# Patient Record
Sex: Female | Born: 1940 | Race: Black or African American | Hispanic: No | Smoking: Former smoker
Health system: Southern US, Community
[De-identification: ages and names within clinical notes are randomized; demographics above are authoritative.]

## PROBLEM LIST (undated history)

## (undated) DIAGNOSIS — C169 Malignant neoplasm of stomach, unspecified: Secondary | ICD-10-CM

## (undated) DIAGNOSIS — K5790 Diverticulosis of intestine, part unspecified, without perforation or abscess without bleeding: Secondary | ICD-10-CM

## (undated) DIAGNOSIS — I2699 Other pulmonary embolism without acute cor pulmonale: Secondary | ICD-10-CM

## (undated) DIAGNOSIS — R197 Diarrhea, unspecified: Secondary | ICD-10-CM

## (undated) DIAGNOSIS — E041 Nontoxic single thyroid nodule: Secondary | ICD-10-CM

## (undated) DIAGNOSIS — M1990 Unspecified osteoarthritis, unspecified site: Secondary | ICD-10-CM

## (undated) DIAGNOSIS — A048 Other specified bacterial intestinal infections: Secondary | ICD-10-CM

## (undated) DIAGNOSIS — E7800 Pure hypercholesterolemia, unspecified: Secondary | ICD-10-CM

## (undated) DIAGNOSIS — R109 Unspecified abdominal pain: Secondary | ICD-10-CM

## (undated) DIAGNOSIS — C799 Secondary malignant neoplasm of unspecified site: Secondary | ICD-10-CM

## (undated) DIAGNOSIS — M79606 Pain in leg, unspecified: Secondary | ICD-10-CM

## (undated) DIAGNOSIS — G8929 Other chronic pain: Secondary | ICD-10-CM

## (undated) DIAGNOSIS — D3500 Benign neoplasm of unspecified adrenal gland: Secondary | ICD-10-CM

## (undated) DIAGNOSIS — I471 Supraventricular tachycardia, unspecified: Secondary | ICD-10-CM

## (undated) DIAGNOSIS — I82409 Acute embolism and thrombosis of unspecified deep veins of unspecified lower extremity: Secondary | ICD-10-CM

## (undated) DIAGNOSIS — K219 Gastro-esophageal reflux disease without esophagitis: Secondary | ICD-10-CM

## (undated) DIAGNOSIS — K279 Peptic ulcer, site unspecified, unspecified as acute or chronic, without hemorrhage or perforation: Secondary | ICD-10-CM

## (undated) DIAGNOSIS — I10 Essential (primary) hypertension: Secondary | ICD-10-CM

## (undated) DIAGNOSIS — K449 Diaphragmatic hernia without obstruction or gangrene: Secondary | ICD-10-CM

## (undated) HISTORY — PX: JOINT REPLACEMENT: SHX530

## (undated) HISTORY — PX: CHOLECYSTECTOMY: SHX55

## (undated) HISTORY — PX: TOTAL KNEE ARTHROPLASTY: SHX125

## (undated) HISTORY — PX: ABDOMINAL HYSTERECTOMY: SHX81

## (undated) HISTORY — PX: APPENDECTOMY: SHX54

## (undated) HISTORY — PX: BREAST LUMPECTOMY: SHX2

## (undated) HISTORY — PX: OTHER SURGICAL HISTORY: SHX169

## (undated) HISTORY — PX: ABLATION: SHX5711

---

## 2000-09-18 ENCOUNTER — Encounter: Payer: Self-pay | Admitting: Neurosurgery

## 2000-09-18 ENCOUNTER — Ambulatory Visit (HOSPITAL_COMMUNITY): Admission: RE | Admit: 2000-09-18 | Discharge: 2000-09-18 | Payer: Self-pay | Admitting: Neurosurgery

## 2000-12-16 ENCOUNTER — Encounter: Payer: Self-pay | Admitting: Specialist

## 2000-12-19 ENCOUNTER — Inpatient Hospital Stay (HOSPITAL_COMMUNITY): Admission: RE | Admit: 2000-12-19 | Discharge: 2000-12-23 | Payer: Self-pay | Admitting: Specialist

## 2001-01-23 ENCOUNTER — Observation Stay (HOSPITAL_COMMUNITY): Admission: RE | Admit: 2001-01-23 | Discharge: 2001-01-24 | Payer: Self-pay | Admitting: General Surgery

## 2001-06-05 ENCOUNTER — Encounter: Payer: Self-pay | Admitting: Family Medicine

## 2001-06-05 ENCOUNTER — Ambulatory Visit (HOSPITAL_COMMUNITY): Admission: RE | Admit: 2001-06-05 | Discharge: 2001-06-05 | Payer: Self-pay | Admitting: Family Medicine

## 2002-02-26 ENCOUNTER — Ambulatory Visit (HOSPITAL_COMMUNITY): Admission: RE | Admit: 2002-02-26 | Discharge: 2002-02-26 | Payer: Self-pay | Admitting: Family Medicine

## 2002-02-26 ENCOUNTER — Encounter: Payer: Self-pay | Admitting: Family Medicine

## 2004-04-08 ENCOUNTER — Emergency Department (HOSPITAL_COMMUNITY): Admission: EM | Admit: 2004-04-08 | Discharge: 2004-04-08 | Payer: Self-pay | Admitting: Emergency Medicine

## 2004-04-26 ENCOUNTER — Ambulatory Visit (HOSPITAL_COMMUNITY): Admission: RE | Admit: 2004-04-26 | Discharge: 2004-04-26 | Payer: Self-pay | Admitting: General Surgery

## 2005-03-09 ENCOUNTER — Inpatient Hospital Stay (HOSPITAL_COMMUNITY): Admission: RE | Admit: 2005-03-09 | Discharge: 2005-03-13 | Payer: Self-pay | Admitting: Emergency Medicine

## 2005-03-09 ENCOUNTER — Emergency Department (HOSPITAL_COMMUNITY): Admission: EM | Admit: 2005-03-09 | Discharge: 2005-03-09 | Payer: Self-pay | Admitting: Emergency Medicine

## 2006-04-25 ENCOUNTER — Emergency Department (HOSPITAL_COMMUNITY): Admission: EM | Admit: 2006-04-25 | Discharge: 2006-04-25 | Payer: Self-pay | Admitting: Emergency Medicine

## 2006-04-26 ENCOUNTER — Emergency Department (HOSPITAL_COMMUNITY): Admission: EM | Admit: 2006-04-26 | Discharge: 2006-04-26 | Payer: Self-pay | Admitting: Emergency Medicine

## 2006-04-26 ENCOUNTER — Emergency Department (HOSPITAL_COMMUNITY): Admission: RE | Admit: 2006-04-26 | Discharge: 2006-04-26 | Payer: Self-pay | Admitting: Emergency Medicine

## 2006-04-29 ENCOUNTER — Inpatient Hospital Stay (HOSPITAL_COMMUNITY): Admission: EM | Admit: 2006-04-29 | Discharge: 2006-05-06 | Payer: Self-pay | Admitting: Emergency Medicine

## 2006-05-03 ENCOUNTER — Encounter: Payer: Self-pay | Admitting: Internal Medicine

## 2006-05-03 ENCOUNTER — Encounter: Payer: Self-pay | Admitting: Vascular Surgery

## 2007-02-09 ENCOUNTER — Emergency Department (HOSPITAL_COMMUNITY): Admission: EM | Admit: 2007-02-09 | Discharge: 2007-02-10 | Payer: Self-pay | Admitting: Emergency Medicine

## 2007-03-19 ENCOUNTER — Ambulatory Visit (HOSPITAL_COMMUNITY): Admission: RE | Admit: 2007-03-19 | Discharge: 2007-03-19 | Payer: Self-pay | Admitting: Family Medicine

## 2007-10-08 HISTORY — PX: CARDIAC CATHETERIZATION: SHX172

## 2007-11-22 ENCOUNTER — Inpatient Hospital Stay (HOSPITAL_COMMUNITY): Admission: EM | Admit: 2007-11-22 | Discharge: 2007-11-26 | Payer: Self-pay | Admitting: Emergency Medicine

## 2007-11-22 ENCOUNTER — Ambulatory Visit: Payer: Self-pay | Admitting: Cardiology

## 2007-11-22 ENCOUNTER — Encounter (INDEPENDENT_AMBULATORY_CARE_PROVIDER_SITE_OTHER): Payer: Self-pay | Admitting: Internal Medicine

## 2007-11-24 ENCOUNTER — Encounter: Payer: Self-pay | Admitting: Cardiovascular Disease

## 2007-11-24 ENCOUNTER — Ambulatory Visit: Payer: Self-pay | Admitting: Cardiology

## 2007-11-25 ENCOUNTER — Other Ambulatory Visit: Payer: Self-pay | Admitting: Cardiology

## 2007-11-25 ENCOUNTER — Other Ambulatory Visit: Payer: Self-pay | Admitting: Cardiovascular Disease

## 2007-12-21 ENCOUNTER — Ambulatory Visit: Payer: Self-pay | Admitting: Cardiology

## 2008-01-18 ENCOUNTER — Ambulatory Visit: Payer: Self-pay | Admitting: Cardiology

## 2008-07-11 ENCOUNTER — Emergency Department (HOSPITAL_COMMUNITY): Admission: EM | Admit: 2008-07-11 | Discharge: 2008-07-11 | Payer: Self-pay | Admitting: Emergency Medicine

## 2009-05-03 DIAGNOSIS — I10 Essential (primary) hypertension: Secondary | ICD-10-CM | POA: Insufficient documentation

## 2009-05-03 DIAGNOSIS — E041 Nontoxic single thyroid nodule: Secondary | ICD-10-CM

## 2009-05-03 DIAGNOSIS — I82409 Acute embolism and thrombosis of unspecified deep veins of unspecified lower extremity: Secondary | ICD-10-CM | POA: Insufficient documentation

## 2009-05-03 DIAGNOSIS — D3500 Benign neoplasm of unspecified adrenal gland: Secondary | ICD-10-CM

## 2009-05-03 DIAGNOSIS — E785 Hyperlipidemia, unspecified: Secondary | ICD-10-CM | POA: Insufficient documentation

## 2009-05-03 DIAGNOSIS — I471 Supraventricular tachycardia: Secondary | ICD-10-CM

## 2009-05-03 DIAGNOSIS — K219 Gastro-esophageal reflux disease without esophagitis: Secondary | ICD-10-CM | POA: Insufficient documentation

## 2009-05-11 ENCOUNTER — Ambulatory Visit: Payer: Self-pay | Admitting: Cardiology

## 2009-05-11 ENCOUNTER — Encounter (INDEPENDENT_AMBULATORY_CARE_PROVIDER_SITE_OTHER): Payer: Self-pay

## 2009-06-28 ENCOUNTER — Inpatient Hospital Stay (HOSPITAL_COMMUNITY): Admission: EM | Admit: 2009-06-28 | Discharge: 2009-07-02 | Payer: Self-pay | Admitting: Emergency Medicine

## 2009-06-29 ENCOUNTER — Ambulatory Visit: Payer: Self-pay | Admitting: Gastroenterology

## 2009-06-29 ENCOUNTER — Encounter: Payer: Self-pay | Admitting: Gastroenterology

## 2009-07-01 ENCOUNTER — Ambulatory Visit: Payer: Self-pay | Admitting: Internal Medicine

## 2009-07-26 ENCOUNTER — Encounter (INDEPENDENT_AMBULATORY_CARE_PROVIDER_SITE_OTHER): Payer: Self-pay | Admitting: *Deleted

## 2009-08-01 ENCOUNTER — Encounter: Payer: Self-pay | Admitting: Gastroenterology

## 2009-08-24 ENCOUNTER — Ambulatory Visit: Payer: Self-pay | Admitting: Internal Medicine

## 2009-08-24 DIAGNOSIS — Z8711 Personal history of peptic ulcer disease: Secondary | ICD-10-CM

## 2009-08-24 DIAGNOSIS — Z860100 Personal history of colon polyps, unspecified: Secondary | ICD-10-CM | POA: Insufficient documentation

## 2009-08-25 ENCOUNTER — Telehealth (INDEPENDENT_AMBULATORY_CARE_PROVIDER_SITE_OTHER): Payer: Self-pay

## 2009-08-25 ENCOUNTER — Encounter: Payer: Self-pay | Admitting: Gastroenterology

## 2009-09-13 ENCOUNTER — Ambulatory Visit: Payer: Self-pay | Admitting: Gastroenterology

## 2009-09-13 ENCOUNTER — Ambulatory Visit (HOSPITAL_COMMUNITY): Admission: RE | Admit: 2009-09-13 | Discharge: 2009-09-13 | Payer: Self-pay | Admitting: Gastroenterology

## 2009-09-13 HISTORY — PX: COLONOSCOPY: SHX174

## 2009-09-13 HISTORY — PX: ESOPHAGOGASTRODUODENOSCOPY: SHX1529

## 2009-09-25 ENCOUNTER — Encounter: Payer: Self-pay | Admitting: Gastroenterology

## 2010-02-20 ENCOUNTER — Encounter (INDEPENDENT_AMBULATORY_CARE_PROVIDER_SITE_OTHER): Payer: Self-pay | Admitting: *Deleted

## 2010-05-23 ENCOUNTER — Ambulatory Visit (HOSPITAL_COMMUNITY): Admission: RE | Admit: 2010-05-23 | Discharge: 2010-05-23 | Payer: Self-pay | Admitting: Family Medicine

## 2010-05-29 ENCOUNTER — Telehealth (INDEPENDENT_AMBULATORY_CARE_PROVIDER_SITE_OTHER): Payer: Self-pay

## 2010-07-30 ENCOUNTER — Ambulatory Visit: Payer: Self-pay | Admitting: Cardiology

## 2010-07-30 DIAGNOSIS — F17200 Nicotine dependence, unspecified, uncomplicated: Secondary | ICD-10-CM

## 2010-07-30 DIAGNOSIS — E663 Overweight: Secondary | ICD-10-CM | POA: Insufficient documentation

## 2010-08-27 ENCOUNTER — Encounter: Payer: Self-pay | Admitting: Gastroenterology

## 2010-09-17 ENCOUNTER — Encounter: Payer: Self-pay | Admitting: Cardiology

## 2010-09-18 ENCOUNTER — Encounter (INDEPENDENT_AMBULATORY_CARE_PROVIDER_SITE_OTHER): Payer: Self-pay | Admitting: *Deleted

## 2010-09-18 LAB — CONVERTED CEMR LAB
ALT: 22 units/L (ref 0–35)
AST: 18 units/L (ref 0–37)
Alkaline Phosphatase: 62 units/L (ref 39–117)
Bilirubin, Direct: 0.1 mg/dL (ref 0.0–0.3)
Cholesterol: 193 mg/dL (ref 0–200)
HDL: 51 mg/dL (ref >39)
Indirect Bilirubin: 0.4 mg/dL (ref 0.0–0.9)
LDL Cholesterol: 116 mg/dL — ABNORMAL HIGH (ref 0–99)
VLDL: 26 mg/dL (ref 0–40)

## 2010-11-06 NOTE — Assessment & Plan Note (Signed)
Summary: f1y  Medications Added DILTIAZEM HCL ER BEADS 360 MG XR24H-CAP (DILTIAZEM HCL ER BEADS) Take one capsule by mouth daily LOVASTATIN 40 MG TABS (LOVASTATIN) take 1 tablet by mouth once daily ZITHROMAX Z-PAK 250 MG TABS (AZITHROMYCIN) take 2 tablets on 1st day then 1 tablet once daily X 4 days      Allergies Added: NKDA  Visit Type:  Follow-up Primary Provider:  Mirna Mires, MD  CC:  chest congestin.  History of Present Illness: [name] comes in today for evaluation and management of her hypertension, obesity, tobacco use, and mixed hyperlipidemia.  She has lost 27 pounds. She feels remarkably better. Her blood pressures excellent today 120/76.  Unfortunately, she is smoking again. She developed an abscess tooth and since then she's had a chronic cough. She is producing a greenish thick sputum. She denies any fever, chills, night sweats. She denies hemoptysis.  Meds reviewed and she is compliant. Her last lipids showed her LDL not below 100 on 20 of lovastatin. Reviewed numbers with her.  She denies any chest pain or angina. She's had no lower extremity edema  Current Medications (verified): 1)  Diltiazem Hcl Er Beads 360 Mg Xr24h-Cap (Diltiazem Hcl Er Beads) .... Take One Capsule By Mouth Daily 2)  Metoprolol Tartrate 50 Mg Tabs (Metoprolol Tartrate) .... One By Mouth Daily 3)  Protonix 40 Mg Tbec (Pantoprazole Sodium) .... One By Mouth Bid 4)  Lovastatin 40 Mg Tabs (Lovastatin) .... Take 1 Tablet By Mouth Once Daily 5)  Aspirin 81 Mg Tbec (Aspirin) .... One By Mouth Daily 6)  Zithromax Z-Pak 250 Mg Tabs (Azithromycin) .... 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 Elpidio Anis  Past Surgical History: Last updated: 15-Sep-2009 right and left TKA, remote Left total hip replacement, remote hysterectomy cholecystectomy Appendectomy excusional breast history  Family History: Last updated: 09-15-09 Father:deceased due to emphysema Mother:deceased due to leukemia No family history of colon cancer, liver disease, peptic ulcer disease  Social History: Last updated: 2009/09/15 Retired [employer] Married, one son Tobacco Use - one pack per day Alcohol Use - no Regular Exercise - no Drug Use - no  Risk Factors: Exercise: no (05/03/2009)  Risk Factors: Smoking Status: never (05/03/2009)  Review of Systems       negative other than history of present illness  Vital Signs:  Patient profile:   70 year old female Weight:      226 pounds BMI:     36.61 Pulse rate:   71 / minute BP sitting:   120 / 76  (right arm)  Vitals Entered By: Dreama Saa, CNA (July 30, 2010 11:12 AM)  Physical Exam  General:  obese.  acuteobese.   Head:  normocephalic and atraumatic Eyes:  PERRLA/EOM intact; conjunctiva and lids normal. Mouth:  no exudate, mild erythema Neck:  Neck supple, no JVD. No masses, thyromegaly or abnormal cervical nodes. Lungs:  no decreased breath sounds or dullness to  percussion. No rhonchi or wheezes Heart:  normal PMI, soft S1-S2, no gallop. Negative carotid bruits Msk:  Back normal, normal gait. Muscle strength and tone normal. Pulses:  pulses normal in all 4 extremities Extremities:  No clubbing or cyanosis. Neurologic:  Alert and oriented x 3. Skin:   Intact without lesions or rashes. Psych:  Normal affect.   Problems:  Medical Problems Added: 1)  Dx of Overweight/obesity  (ICD-278.02) 2)  Dx of Overweight/obesity  (ICD-278.02) 3)  Dx of Tobacco User  (ICD-305.1)  Impression & Recommendations:  Problem # 1:  DYSLIPIDEMIA (ICD-272.4)  Will increase lovastatin to 40 mg per day. All blood work in 6 weeks. Goal LDL less than 100 Her updated medication list for this problem includes:    Lovastatin 40 Mg Tabs (Lovastatin) .Marland Kitchen... Take 1 tablet by mouth once daily  Future Orders: T-Lipid Profile ([id number]) ... 09/17/2010 T-Hepatic Function [id number]) ... 09/17/2010  Her updated medication list for this problem includes:    Lovastatin 40 Mg Tabs (Lovastatin) .Marland Kitchen... Take 1 tablet by mouth once daily  Problem # 2:  HYPERTENSION (ICD-401.9) Assessment: Improved  Her updated medication list for this problem includes:    Diltiazem Hcl Er Beads 360 Mg Xr24h-cap (Diltiazem hcl er beads) .Marland Kitchen... Take one capsule by mouth daily    Metoprolol Tartrate 50 Mg Tabs (Metoprolol tartrate) ..... One by mouth daily    Aspirin 81 Mg Tbec (Aspirin) ..... One by mouth daily  Her updated medication list for this problem includes:    Diltiazem Hcl Er Beads 360 Mg Xr24h-cap (Diltiazem hcl er beads) .Marland Kitchen... Take one capsule by mouth daily    Metoprolol Tartrate 50 Mg Tabs (Metoprolol tartrate) ..... One by mouth daily    Aspirin 81 Mg Tbec (Aspirin) ..... One by mouth daily  Problem # 3:  PSVT (ICD-427.0) Assessment: Unchanged  Her updated medication list for this problem includes:    Diltiazem Hcl Er Beads 360 Mg Xr24h-cap (Diltiazem hcl er beads) .Marland Kitchen... Take one capsule by mouth daily    Metoprolol Tartrate 50 Mg Tabs (Metoprolol tartrate) ..... One by mouth daily    Aspirin 81 Mg Tbec (Aspirin) ..... One by mouth daily  Her updated medication list for this problem includes:    Diltiazem Hcl Er Beads 360 Mg Xr24h-cap (Diltiazem hcl er beads) .Marland Kitchen...  Take one capsule by mouth daily    Metoprolol Tartrate 50 Mg Tabs (Metoprolol tartrate) ..... One by mouth daily    Aspirin 81 Mg Tbec (Aspirin) ..... One by mouth daily  Problem # 4:  TOBACCO USER (ICD-305.1) Assessment: Deteriorated she has acute on chronic bronchitis. Gave her antibiotic and advised to quit.  Problem # 5:  OVERWEIGHT/OBESITY (ICD-278.02) Assessment: Improved  Patient Instructions: 1)  Your physician recommends that you schedule a follow-up appointment in: 12 months 2)  Your physician recommends that you return for lab work in:6 weeks, wewill mail lab orders to you 3)  Your physician has recommended you make the following change in your medication: Start taking Z-pac as directed and increase Lovastatin to 40mg  once daily   4)  Your physician discussed the hazards of tobacco use.  Tobacco use cessation is recommended and techniques and options to help you quit were discussed. Prescriptions: ZITHROMAX Z-PAK 250 MG TABS (AZITHROMYCIN) take 2 tablets on 1st day then 1 tablet once daily X 4 days  #6 x 0   Entered by:   Larita Fife Via LPN   Authorized by:   Gaylord Shih, MD, [facility]  Signed by:   Larita Fife Via LPN on 91/47/8295   Method used:   Electronically to        [facility]* (retail)       [address]       [place], [place]  [address]       Ph: [phone]       Fax: [phone]   RxID:   [id number]   Handout requested. LOVASTATIN 40 MG TABS (LOVASTATIN) take 1 tablet by mouth once daily  #30 x 6   Entered by:   Larita Fife Via LPN   Authorized by:   Gaylord Shih, MD, [facility]   Signed by:   Larita Fife Via LPN on 42/59/5638   Method used:   Electronically to        [facility]* (retail)       [address]       [place], [place]  [address]       Ph: [phone]       Fax: [phone]   RxID:   [id number] DILTIAZEM HCL ER BEADS 360 MG XR24H-CAP (DILTIAZEM HCL ER BEADS) Take one capsule by mouth daily  #30 x 6   Entered by:   Larita Fife Via LPN    Authorized by:   Gaylord Shih, MD, [facility]   Signed by:   Larita Fife Via LPN on 03/30/7627   Method used:   Electronically to        [facility]* (retail)       [address]       [place], [place]  [address]       Ph: [phone]       Fax: [phone]   RxID:   [id number]

## 2010-11-06 NOTE — Letter (Signed)
Summary: [employer] PATIENT CHART REQUEST  [employer] PATIENT CHART REQUEST   Imported By: Rexene Alberts 08/27/2010 15:52:37  _____________________________________________________________________  External Attachment:    Type:   Image     Comment:   External Document

## 2010-11-06 NOTE — Letter (Signed)
Summary:  [facility], [facility] at [facility]  [address] [place] [address]   Phone: [phone]  Fax: [phone]     July 30, 2010 MRN: [id number]   [name] [address] [place], [place]  [address]      YOUR LAB WORK IS DUE  September 17, 2010 _________________________________________  Please go to [facility], located across the street from [facility] on the second floor.  Hours are Monday - Friday 7am until 7:30pm         Saturday 8am until 12noon    _X_  DO NOT EAT OR DRINK AFTER MIDNIGHT EVENING PRIOR TO LABWORK  __ YOUR LABWORK IS NOT FASTING --YOU MAY EAT PRIOR TO LABWORK

## 2010-11-06 NOTE — Progress Notes (Signed)
Summary: rx refill   Phone Note Call from Patient Call back at Home Phone [phone]   Reason for Call: Refill Medication Summary of Call: diltiazem 360mg  [facility] pt has made appt for 07/2010 with dr wall Initial call taken by: Faythe Ghee,  May 29, 2010 4:48 PM    Prescriptions: DILTIAZEM HCL ER BEADS 360 MG XR24H-CAP (DILTIAZEM HCL ER BEADS) Take one capsule by mouth daily  #30 x 1   Entered by:   Larita Fife Via LPN   Authorized by:   Gaylord Shih, MD, [facility]   Signed by:   Larita Fife Via LPN on 13/05/6577   Method used:   Electronically to        [facility]* (retail)       [address]       [place], [place]  [address]       Ph: [phone]       Fax: [phone]   RxID:   [id number]

## 2010-11-06 NOTE — Letter (Signed)
Summary: Appointment - Reminder 2  [facility] [place]. [address], [place] [address]   Phone: [phone]  Fax: [phone]     Feb 20, 2010 MRN: [id number]   [name] [address] [place], [place]  [address]   Dear Ms. Diviney,  Our records indicate that it is time to schedule a follow-up appointment.  Dr.   Daleen Squibb       recommended that you follow up with [place] in     11/2009       . It is very important that we reach you to schedule this appointment. We look forward to participating in your health care needs. Please contact us at the number listed above at your earliest convenience to schedule your appointment.  If you are unable to make an appointment at this time, give [place] a call so we can update our records.     Sincerely,   Glass blower/designer

## 2010-11-08 NOTE — Letter (Signed)
Summary: [place] [facility]  [address], [place] [address]   Phone: [phone]  Fax: [phone]     September 18, 2010 MRN: [id number]   [name] [address] [place], [place]  [address]      YOUR LAB WORK IS DUE   October 30, 2010  Please go to [facility], located across the street from [facility] on the second floor.  Hours are Monday - Friday 7am until 7:30pm         Saturday 8am until 12noon    _X_  DO NOT EAT OR DRINK AFTER MIDNIGHT EVENING PRIOR TO LABWORK

## 2011-01-11 LAB — COMPREHENSIVE METABOLIC PANEL
ALT: 30 U/L (ref 0–35)
Alkaline Phosphatase: 53 U/L (ref 39–117)
Alkaline Phosphatase: 60 U/L (ref 39–117)
BUN: 5 mg/dL — ABNORMAL LOW (ref 6–23)
BUN: 7 mg/dL (ref 6–23)
CO2: 29 mEq/L (ref 19–32)
Calcium: 8.7 mg/dL (ref 8.4–10.5)
Chloride: 105 mEq/L (ref 96–112)
GFR calc Af Amer: >60 mL/min (ref >60)
GFR calc non Af Amer: >60 mL/min (ref >60)
Glucose, Bld: 116 mg/dL — ABNORMAL HIGH (ref 70–99)
Glucose, Bld: 116 mg/dL — ABNORMAL HIGH (ref 70–99)
Potassium: 3.9 mEq/L (ref 3.5–5.1)
Sodium: 139 mEq/L (ref 135–145)
Total Bilirubin: 0.5 mg/dL (ref 0.3–1.2)
Total Protein: 7 g/dL (ref 6.0–8.3)

## 2011-01-11 LAB — PROTIME-INR
INR: 1 (ref 0.00–1.49)
INR: 1.1 (ref 0.00–1.49)
Prothrombin Time: 13.8 seconds (ref 11.6–15.2)

## 2011-01-11 LAB — URINE CULTURE

## 2011-01-11 LAB — CBC
MCV: 80.9 fL (ref 78.0–100.0)
Platelets: 254 10*3/uL (ref 150–400)
Platelets: 287 10*3/uL (ref 150–400)
RBC: 5.08 MIL/uL (ref 3.87–5.11)
RDW: 16.4 % — ABNORMAL HIGH (ref 11.5–15.5)
RDW: 16.6 % — ABNORMAL HIGH (ref 11.5–15.5)

## 2011-01-11 LAB — TYPE AND SCREEN: ABO/RH(D): O POS

## 2011-01-11 LAB — BASIC METABOLIC PANEL
BUN: 5 mg/dL — ABNORMAL LOW (ref 6–23)
Calcium: 8.8 mg/dL (ref 8.4–10.5)
GFR calc non Af Amer: >60 mL/min (ref >60)
Glucose, Bld: 104 mg/dL — ABNORMAL HIGH (ref 70–99)

## 2011-01-11 LAB — HEMOGLOBIN AND HEMATOCRIT, BLOOD
HCT: 37.8 % (ref 36.0–46.0)
HCT: 38.6 % (ref 36.0–46.0)
HCT: 38.7 % (ref 36.0–46.0)
HCT: 38.8 % (ref 36.0–46.0)
Hemoglobin: 12.3 g/dL (ref 12.0–15.0)
Hemoglobin: 12.6 g/dL (ref 12.0–15.0)
Hemoglobin: 12.7 g/dL (ref 12.0–15.0)
Hemoglobin: 12.9 g/dL (ref 12.0–15.0)
Hemoglobin: 12.9 g/dL (ref 12.0–15.0)
Hemoglobin: 13 g/dL (ref 12.0–15.0)

## 2011-01-11 LAB — DIFFERENTIAL
Basophils Absolute: 0 10*3/uL (ref 0.0–0.1)
Eosinophils Relative: 2 % (ref 0–5)
Lymphocytes Relative: 23 % (ref 12–46)
Lymphocytes Relative: 31 % (ref 12–46)
Monocytes Absolute: 0.4 10*3/uL (ref 0.1–1.0)
Monocytes Relative: 7 % (ref 3–12)
Neutro Abs: 3.2 10*3/uL (ref 1.7–7.7)
Neutrophils Relative %: 57 % (ref 43–77)

## 2011-01-11 LAB — LIPASE, BLOOD: Lipase: 39 U/L (ref 11–59)

## 2011-01-11 LAB — URINALYSIS, ROUTINE W REFLEX MICROSCOPIC
Hgb urine dipstick: NEGATIVE
Nitrite: NEGATIVE
Protein, ur: NEGATIVE mg/dL
pH: 6 (ref 5.0–8.0)

## 2011-01-11 LAB — H. PYLORI ANTIBODY, IGG: H Pylori IgG: 1.3 {ISR} — ABNORMAL HIGH

## 2011-02-19 NOTE — H&P (Signed)
[name], [name]                ACCOUNT NO.:  [id number]   MEDICAL RECORD NO.:  [id number]          PATIENT TYPE:  INP   LOCATION:  A219                          FACILITY:  [facility]   PHYSICIAN:  Gerrit Friends. Dietrich Pates, MD, FACCDATE OF BIRTH:  09-19-1941   DATE OF ADMISSION:  11/22/2007  DATE OF DISCHARGE:  LH                              HISTORY & PHYSICAL   REFERRING PHYSICIAN:  Osvaldo Shipper, M.D.   REASON FOR REFERRAL:  1. Supraventricular tachycardia.  2. Elevated cardiac enzymes.   HISTORY OF PRESENT ILLNESS:  [name] is a 70 year old female patient  without any known history of CAD who presented to [facility]  early yesterday morning with acute onset of substernal chest tightness,  tachy palpitations, and right arm pain.  She was found to be in  supraventricular tachycardia with a heart rate in the 180s.  She was  treated with Adenocard x1 and she returned to normal sinus rhythm.  She  was admitted for further evaluation and treatment.   Unfortunately [name]'s sister recently died.  She has been under a  great deal of stress and traveling back and forth to the funeral home.  She has been smoking more cigarettes lately and also drinking excessive  amounts of green tea.  She is unsure whether or not she has had any  exertional chest discomfort or shortness of breath recently.  She has,  however, developed a recent cough over the last couple of days.  This is  fairly nonproductive.  She denies any fevers or chills.  She denies  hemoptysis.  Early yesterday morning she developed the sudden onset of  substernal chest tightness and tachy palpitations.  She has had tachy  palpitations like this in the past, but not this severe.  She had  radiation down her right arm as well as diaphoresis and shortness of  breath.  She denies any syncope or near syncope.  As noted above, she  converted to normal sinus rhythm with  adenosine.  She tells me that she  did have an episode  of tachy palpitations 10-to-15 years ago and was  apparently evaluated at [facility].  At that point in time she  was told that she had an angina attack.   PAST MEDICAL HISTORY:  1. Hypertension.  2. Hyperlipidemia.  3. History of thyroid nodule.  4. History of left adrenal adenoma.  5. Gastroesophageal reflux disease/peptic ulcer disease.  6. History of right lower extremity DVT in 2007.      a.     She was also noted to have hematoma in her right calf which       required hospitalization-repeat Dopplers revealed DVT to be below       the knee.  7. Status post bilateral total knee replacements.  8. Status post left hip replacement.  9. Status post total abdominal hysterectomy and bilateral salpingo-      oophorectomy.  10.Status post cholecystectomy.  11.Status post left breast lumpectomy.   ALLERGIES:  NO KNOWN DRUG ALLERGIES.   MEDICATIONS PRIOR TO ADMISSION:  1. Toprol-XL  50 mg daily.  2. Lovastatin 40 mg daily.  3. Aspirin 81 mg daily.   SOCIAL HISTORY:  The patient is married, has 1 child.  She smokes  cigarettes at 1-to-2 packs per day for the last 20 years.  She denies  alcohol or drug abuse.  She is retired from [employer].   FAMILY HISTORY:  Her sister recently passed away in her 100s from  congestive heart failure.   REVIEW OF SYSTEMS:  Please see HPI.  She denies any melena,  hematochezia, hematuria, dysuria.  She does have some dysphagia as well  as odynophagia.  This has been chronic for some years now without much  change.  She denies any monocular blindness, unilateral weakness,  difficulty with speech, or facial droop.  She denies any claudication  symptoms.  She has gained some weight, but nothing significant.  She  denies any skin or hair changes.  The rest of the review of systems are  negative.   PHYSICAL EXAMINATION:  GENERAL:  She is a well-developed, well-nourished  female in no acute distress.  VITAL SIGNS:  Blood pressure is  131/78, pulse of 94, respirations 18,  temperature 97.5, oxygen is 93% on room air.  HEENT:  Normal.  NECK:  Without JVD.  Without thyromegaly.  LYMPH:  Without lymphadenopathy.  CARDIAC:  Normal S1 and S2.  Regular rate and rhythm.  There is a 1/6  systolic ejection murmur heard best at the left lower sternal border.  LUNGS:  Decreased breath sounds bilaterally, expiratory wheezing  throughout.  No rales.  ABDOMEN:  Soft, nontender with normoactive bowel sounds, no  organomegaly.  EXTREMITIES:  Without edema.  SKIN:  Warm and dry.  NEUROLOGIC:  She is alert and oriented x3.  Cranial nerves II through  XII are grossly intact.  VASCULAR EXAM:  Without carotid bruits bilaterally.  Femoral artery  pulses are 2+ bilaterally without bruits.  Dorsalis pedis and posterior  tibialis pulses are 2+ bilaterally.   Electrocardiogram on admission revealed SVT with a heart rate of 184, 1-  to-2 mm of ST depression in II, III, aVF, and V5 and V6.  Followup EKGs  revealed normal sinus rhythm.  Chest x-ray on admission revealed no  acute disease.   LABORATORY STUDIES:  White count 4700, hemoglobin 13.3, hematocrit 38.8,  MCV 81.9, platelet count 255,000.  INR 1.  Sodium 137, potassium 3.6,  glucose 183, BUN 8, creatinine 0.8.  CK#1 339, #2 365, #3 447.  CK-MB #1  3.5, #2 13.5, #3 10.9.  Troponin #1 0.06, #2 1.41, #3 1.32.  BNP 40.9.   IMPRESSION:  1. Supraventricular tachycardia.  2. Non-ST elevation myocardial infarction in the setting of #1.  3. Probable chronic obstructive pulmonary disease exacerbation.  4. Hypertension.  5. Hyperlipidemia.  6. Smoker.  7. Gastroesophageal reflux disease.  8. History of deep venous thrombosis.  9. Osteoarthritis.   PLAN:  [name] presents with supraventricular tachycardia and positive  cardiac enzymes.  She does have significant risk factors for coronary  artery disease.  She will require further evaluation with cardiac  catheterization.  Risks and  benefits have been explained to the patient.  She agrees to proceed.  She will be transferred to [facility]  for cardiac catheterization.  She will be continued on Lovenox and  aspirin.  Her statin will be continued.   She will also require EP evaluation for her supraventricular  tachycardia.  Her diltiazem will be discontinued.  She will need continued treatment for her COPD exacerbation.  She has  been placed on IV Solu-Medrol as well as Levaquin at [facility].  We will continue that as well as her nebulizer treatments.  We will also place her on proton pump inhibitor therapy.   The patient was also interviewed and examined by Dr. Marlton Bing  today.      Tereso Newcomer, PA-C      Gerrit Friends. Dietrich Pates, MD, [facility]  Electronically Signed    SW/MEDQ  D:  11/23/2007  T:  11/24/2007  Job:  [id number]   cc:   Annia Friendly. Loleta Chance, MD  Fax: [phone]

## 2011-02-19 NOTE — Op Note (Signed)
NAMEKATHRIN, FOLDEN NO.:  [id number]   MEDICAL RECORD NO.:  [id number]          PATIENT TYPE:  INP   LOCATION:  2034                         FACILITY:  [facility]   PHYSICIAN:  Doylene Canning. 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   INTRODUCTION:  The patient is a 70 year old woman with a history of  recurrent tachy palpitations dating back approximately 5 years.  She has  been on beta blocker therapies.  She presented to hospital with SVT and  was subsequently found to rule in for a non-Q-wave myocardial  infarction.  The patient underwent catheterization demonstrating no  coronary disease.  She is now referred for electrophysiologic study and  catheter ablation of her SVT.   PROCEDURE:  After informed consent was obtained, the patient was taken  to the diagnostic EP lab in a fasting state.  After the usual  preparation and draping, intravenous fentanyl and Midazolam were given  for sedation.  A 6-French hexapolar catheter was inserted percutaneously  into the right jugular vein and advanced to coronary sinus.  A 5-French  quadripolar catheter was inserted percutaneously in the right femoral  vein and advanced to the RV apex.  A 5-French quadripolar catheter was  inserted percutaneously in the right femoral vein and advanced to His  bundle region.  After measurement of the basic intervals, rapid  ventricular pacing was carried out from the RV apex demonstrating VA  Wenckebach cycle length of 260 milliseconds.  During rapid ventricular  pacing the atrial activation sequence appeared be midline.  It was  minimally decremental.  Programmed ventricular stimulation was then  carried out  from the RV apex at base drive cycle length of 454  milliseconds.  The S1-S2 interval was stepwise decreased down to 240  milliseconds where ventricular refractoriness was observed.  During  programmed ventricular stimulation the atrial activation sequence again  appeared to be midline though it was minimally decremental.  During  programmed ventricular stimulation there was nonsustained SVT.  Rapid  atrial pacing was then carried out from the coronary sinus as well as  the high right atrium at pace cycle length of 500 milliseconds and  stepwise decreased down to 270 milliseconds where AV Wenckebach was  observed.  During rapid atrial pacing the PR interval was less than the  RR interval and there was no inducible SVT.  Programmed atrial  stimulation was carried out from the coronary sinus and high right  atrium at base drive cycle length of 098 milliseconds.  The S1-S2  interval was stepwise decreased to 240 milliseconds where atrial  refractoriness was observed.  During programmed atrial stimulation there  were no AH jumps and no echo beats noted.  At this point Isuprel was  infused at rates between 2 and 4 mcg per minute.  Rapid atrial pacing on  Isuprel resulted in the initiation of SVT.  This was very rapid  tachycardia with a cycle length of 240 milliseconds.  The patient was  hemodynamically stable though she did complain of some chest discomfort  and chest heaviness.  PVCs could not be placed secondary to the extreme  speed of the tachycardia and would not capture the ventricle.  Ventricular pacing was carried out at one point, demonstrating a VAV  conduction sequence.  At this point with the midline atrial activation,  a diagnosis of a concealed posteroseptal accessory pathway was made,  based on the lack of the AH prolongation during probing stimulation and  the longer than usual VA conduction time during tachycardia.  The VA  time was relatively short, however compared to what  would be typically  expected with an accessory pathway.  Mapping demonstrated earliest  atrial activation in the His A as well as near the CS os.  A 7-French  quadripolar ablation catheter was then maneuvered into the posteroseptal  space.  Extensive mapping and ablation was carried out with a total of  11 RF energy applications delivered in and around the coronary sinus  ostium.  Following ablation, there was still nonsustained SVT present.  The VA times, however, had changed and they were much longer and the  atrial activation appeared to still be posteroseptal but it was unclear  whether it was actually through the AV node or not.  With all the above  and concerns that additional RF energy application deep into the  coronary sinus might resulted in perforation, because of additional  ablation near the AV node my concern was for the creation complete heart  block and because of the overall difficulty with the procedure after  ablation in terms of getting the patient's SVT to return and be  sustained, making mapping more difficult, it was deemed most appropriate  to discontinue the procedure and the patient was returned to her room in  satisfactory condition after the sheath was pulled.   COMPLICATIONS:  There were no immediate complications.   RESULTS:  a.  Baseline ECG.  The baseline ECG demonstrates sinus rhythm  with normal axis and intervals.  b.  Baseline intervals.  Sinus node cycle length was 720 milliseconds.  The HV interval was 46 milliseconds.  The AH interval 59 milliseconds.  c.  Rapid ventricular pacing.  Rapid ventricular pacing was carried out  the RV apex demonstrating VA Wenckebach cycle length 260 milliseconds.  The atrial activation appeared to be midline during rapid ventricular  pacing.  It was minimally decremental.  d.  Programmed ventricular stimulation.  Programmed ventricular  stimulation was carried out from the RV apex at base drive cycle length  of 161  milliseconds.  The S1-S2 interval was stepwise decreased to 240  milliseconds where retrograde AV node ERP was observed.  During  programmed ventricular stimulation, the atrial activation was midline  but minimally decremental.  e.  Rapid atrial pacing.  Rapid atrial pacing initially demonstrated AV  Wenckebach cycle length of 270 milliseconds.  On Isuprel additional  rapid atrial pacing was carried out down to 240 milliseconds with  persistence of AV conduction.  During rapid atrial pacing the PR  interval was less than the RR interval and on Isuprel there was  inducible SVT.  f.  Programmed atrial stimulation.  Programmed atrial stimulation was  carried out from the coronary sinus and high right atrium with base  drive cycle length 096 milliseconds with the S1-S2  interval stepwise  decreased down to 240 milliseconds where atrial refractoriness was  observed.  During programmed atrial stimulation there were no AH jumps  and no echo beats noted.  There is no inducible SVT.  g.  Arrhythmias observed.  1. AV reentry tachycardia.  Initiation was with rapid atrial pacing on      Isuprel.  Duration was initially sustained.  Cycle length was 240      milliseconds.  PVCs could not be placed secondary to the inability      to capture the ventricle secondary to the very rapid rates.  Rapid      ventricular pacing did demonstrate a VAV conduction sequence.      a.     Mapping.  Mapping of the patient's SVT was very difficult.       There appeared be change in the atrial activation sequences during       mapping, raising the question of multiple septal accessory       pathways.  Following ablation for which extensive ablation was       carried out along the posteroseptal space as well as into the       coronary sinus as well as approximately halfway up Koch's triangle       towards the AV node, following ablation, sustained tachycardia       could not be reinduced.  There was additional  nonsustained SVT       with different atrial activation sequences raising the question of       what was residually left of the patient's accessory pathway       conduction.  However, because of the extensive RF energy       application in very difficult and potentially dangerous sites,       additional RF energy application was not delivered.   CONCLUSION:  This study demonstrates inducible AV reentry tachycardia  utilizing a concealed posterior versus mid septal accessory pathway  perhaps both.  It demonstrates following ablation no sustained  tachycardia to be present with clinical significance uncertain.      Doylene Canning. Ladona Ridgel, MD  Electronically Signed     GWT/MEDQ  D:  11/25/2007  T:  11/26/2007  Job:  [id number]   cc:   Gerrit Friends. Dietrich Pates, MD, [facility]

## 2011-02-19 NOTE — Assessment & Plan Note (Signed)
[facility]                       [place] [facility] OFFICE NOTE   [name], [name]                       MRN:          [id number]  DATE:05/11/2009                            DOB:          1940/10/12    PROBLEM LIST:  1. Hypertension.  2. Obesity.  3. Hypercholesterolemia.  4. Ongoing tobacco abuse.  5. History of hiatal hernia.   CURRENT MEDICATIONS:  1. Cardizem 360 daily.  2. Mucinex 600 mg p.o. b.i.d.  3. Protonix 40 mg daily.  4. Lovastatin 20 mg daily.  5. Enteric-coated aspirin 81 mg daily.   ALLERGIES:  No known drug allergies.   This is a very pleasant 70 year old [place] American female who is here  on followup after being seen by Dr. Daleen Squibb in April 2009 for 1-year  followup.  The patient was given a lengthy discussion by Dr. Daleen Squibb  concerning blood pressure control losing weight and stopping smoking.  The patient's Cardizem was refilled at the time of that visit, and she  was advised to take her medicines as directed and increase her activity  level.   The patient states she has been feeling well, but she is upset about a  10-pound weight gain, so she returned to smoking to help with her weight  loss.  This was discouraged on this office visit.  She also requested  some medication to help her to lose weight and again this was refused  secondary to her history of hypertension.  The patient otherwise has  been feeling well.  Her blood pressure is very well controlled, and she  has no complaints with the exception of the weight gain.  All systems  have been reviewed and are found to be negative.   PHYSICAL EXAMINATION:  VITAL SIGNS:  Weight 260 pounds, blood pressure  128/78, pulse 89, respirations 20.  HEENT:  Head is normocephalic and atraumatic.  Eyes, PERRLA.  Mucous  membranes and mouth are pink and moist.  Tongue is midline.  NECK:  Supple without JVD or carotid bruits appreciated.  CARDIOVASCULAR:  Regular rate and  rhythm without murmurs, rubs, or  gallops.  Pulses are 2+ and equal bilaterally.  LUNGS:  Clear to auscultation without wheezes, rales, or rhonchi.  ABDOMEN:  Soft, nontender, obese with 2+ bowel sounds.   IMPRESSION:  1. Hypertension.  Well controlled on current medication regimen.  2. Obesity.  This has been discussed with the patient to increase her      activity level, to walking at least 15 minutes a day and to avoid      high-calorie foods.  The patient has also been advised on a low-      salt diet with ongoing hypertension, although it is well controlled      on current medication regimen.  The patient is requesting      medication diet drugs to assist in weight loss.  This has been      refused in the setting of hypertension.  3. Hypercholesterolemia.  The patient continues on her lovastatin and      will have a follow up with primary  care physician concerning lipids      and LFTs at his discretion.  4. Inactivity.  The patient has been advised on walking 15 minutes a      day and to increase this distance and time as she is able to      tolerate.      Bettey Mare. Lyman Bishop, NP  Electronically Signed      Jesse Sans. Daleen Squibb, MD, [facility]  Electronically Signed   KML/MedQ  DD: 05/11/2009  DT: 05/12/2009  Job #: [id number]   cc:   Annia Friendly. Loleta Chance, MD

## 2011-02-19 NOTE — Letter (Signed)
December 21, 2007    Annia Friendly. Loleta Chance, MD  [address]  [place] [address], [place] [address]   RE:  [name], [name]  MRN:  [id number]  /  DOB:  05-01-1941   Dear Earvin Hansen:   Ms. Cipriani returns to the office following a recent admission a [facility] with SVT and chest discomfort.  Cardiac markers were  borderline positive, but cardiac catheterization revealed no significant  coronary disease.  She underwent radiofrequency ablation with control of  her arrhythmia.  She has felt fine since hospital discharge.   Current medications include Protonix 40 mg b.i.d., Diltiazem 360 mg  daily, Mucinex, prednisone 10 mg daily, Xopenex by MDI.   EXAM:  Overweight pleasant woman in no acute distress.  The weight is 238.  Blood pressure 115/75, heart rate 95 and regular,  respirations 18.  NECK:  No jugular venous distention; small area of scarring at the site  of a pacing wire insertion into the right jugular.  LUNGS:  Clear.  CARDIAC:  Normal first and second heart sounds.  ABDOMEN:  Soft and nontender; no organomegaly.  EXTREMITIES:  Benign right femoral vein catheterization site.   EKG:  Normal sinus rhythm; left atrial abnormality; otherwise normal.   IMPRESSION:  Ms. Morsch is doing well following radiofrequency ablation  of a supraventricular tachycardia.  Her likelihood of durable cure is  greater than 90%.  She does not appear to require beta-blocker and  benazepril, which she previously took for hypertension.  I will send her  back to Dr. Loleta Chance to monitor blood pressure.  I would be happy to  reassess this nice woman in any time he deems appropriate.    Sincerely,      Gerrit Friends. Dietrich Pates, MD, [facility]  Electronically Signed    RMR/MedQ  DD: 12/21/2007  DT: 12/21/2007  Job #: [id number]

## 2011-02-19 NOTE — Procedures (Signed)
NAMELINDEN, MIKES                ACCOUNT NO.:  [id number]   MEDICAL RECORD NO.:  [id number]          PATIENT TYPE:  INP   LOCATION:  A219                          FACILITY:  [facility]   PHYSICIAN:  Gerrit Friends. Dietrich Pates, MD, FACCDATE OF BIRTH:  30-Jan-1941   DATE OF PROCEDURE:  11/23/2007  DATE OF DISCHARGE:  11/24/2007                                ECHOCARDIOGRAM   CLINICAL DATA:  The patient is a 70 year old woman with SVT,  hypertension, and possible non-Q myocardial infarction.   M-MODE:  Aorta 2.8, left atrium 4.5, septum 1.7, posterior wall 1.5, LV  diastole 3.4, LV systole 2.5.   1. Technically suboptimal and somewhat limited echocardiographic      study.  2. Mild left atrial enlargement; right atrial imaging suboptimal.  3. Normal right ventricular size and function; mild RVH.  4. Mild sclerosis of a trileaflet aortic valve.  5. Normal diameter of the proximal ascending aorta; mild calcification      of the wall and annulus.  6. Normal mitral valve; mild annular calcification.  7. Normal pulmonic valve and normal proximal pulmonary artery.  8. Normal left ventricular size; mild to moderate hypertrophy;      hyperdynamic regional and global function with cavity obliteration      during systole.      Gerrit Friends. Dietrich Pates, MD, [facility]  Electronically Signed     RMR/MEDQ  D:  11/23/2007  T:  11/24/2007  Job:  [id number]

## 2011-02-19 NOTE — Discharge Summary (Signed)
[name], [name]                ACCOUNT NO.:  [id number]   MEDICAL RECORD NO.:  [id number]          PATIENT TYPE:  INP   LOCATION:  A219                          FACILITY:  [facility]   PHYSICIAN:  Osvaldo Shipper, MD     DATE OF BIRTH:  Feb 14, 1941   DATE OF ADMISSION:  11/22/2007  DATE OF DISCHARGE:  LH                               DISCHARGE SUMMARY   DATE OF POTENTIAL DISCHARGE:  November 24, 2007.   The patient is being transferred to [facility] for a  cardiac catheterization.   DIAGNOSES AT THE TIME OF TRANSFER:  1. Non-ST elevation myocardial infarction.  2. Supraventricular tachycardia, resolved.  3. Acute bronchitis.  4. History of hypertension.  5. Dyslipidemia, stable.   BRIEF HOSPITAL COURSE:  Briefly, this is a 70 year old African American  female who came into the hospital with shortness of breath and wheezing  and was having chest pain and was found to have SVT at the rate of 180s.  She was given adenosine, with which she corrected to a sinus rhythm.  Her chest pain persisted.  The patient was admitted to the hospital for  further evaluation.  Her cardiac markers went up with the CK going up to  365 and the troponin to 1.41.  Troponin has come down today to 0.62.  CK  is 693 today.  Her other labs are all fine.  Her HbA1c is 6.2 with a  mean glucose of 143.  TSH was 0.103.  Cholesterol was checked, which  showed an LDL of 118.  The patient was seen by cardiology, who felt that  her coronary artery disease may have been unmasked by the SVT and they  felt that she needed a cardiac catheterization.  So the patient is being  transferred to [facility].  She was supposed to go  yesterday but there was no bed available, so she is still waiting at  this point.   Her acute bronchitis has improved in the meantime.  She continues to be  on steroids, nebulizer treatments and antibiotics.  She has doing much,  much better compared to  yesterday.   Hypertension:  We have added lisinopril as of yesterday and her blood  pressure is better-controlled.  She continues to be on statin.  We  avoided beta blockers because of significant wheezing.   Discharge medications, instructions, etc., will be decided when the  patient is discharged from [facility].   This morning the patient is feeling much.  Better said her breathing is  better.  She still had some nonspecific chest pain in the retrosternal  region which is described as a tightness, but that is also better.  Her vital signs show that this morning her temperature was 98.4, heart  rate 101, respiratory rate 18, saturations 95% on room air, blood  pressure 132/80.  LUNGS:  Significantly improved air entry.  Now very few wheezing  compared to yesterday and the day before.  ABDOMEN:  Soft.  EXTREMITIES:  No edema.   Labs all stable.  As mentioned above,  troponin has trended down.  Her  TSH is low.   ASSESSMENT/PLAN:  As per above, awaiting transfer to [facility] for  cardiac catheterization.  Echo report is still pending. Repeat TFT's  will be ordered. Her steroids will also need to be tapered.   TOTAL TIME AT DISCHARGE:  35 minutes.      Osvaldo Shipper, MD  Electronically Signed     GK/MEDQ  D:  11/24/2007  T:  11/24/2007  Job:  [id number]   cc:   Gerrit Friends. Dietrich Pates, MD, [facility]  [address]  [place], [place] [address]   Annia Friendly. Loleta Chance, MD  Fax: [phone]

## 2011-02-19 NOTE — Discharge Summary (Signed)
[name], Frost NO.:  [id number]   MEDICAL RECORD NO.:  [id number]          PATIENT TYPE:  INP   LOCATION:  2034                         FACILITY:  [facility]   PHYSICIAN:  Madolyn Frieze. Jens Som, MD, FACCDATE OF BIRTH:  07/05/1941   DATE OF ADMISSION:  11/24/2007  DATE OF DISCHARGE:  11/26/2007                               DISCHARGE SUMMARY   PROCEDURES:  1. Two-view chest x-ray.  2. Cardiac catheterization.  3. Coronary arteriogram.  4. Left ventriculogram.  5. 2-D echocardiogram.  6. Supraventricular tachycardia ablation.   PRIMARY FINAL DISCHARGE DIAGNOSIS:  Supraventricular tachycardia.   SECONDARY DIAGNOSES:  1. Non-ST segment elevation myocardial infarction secondary to demand      ischemia with cardiac catheterization showing no coronary artery      disease.  2. Hyperdynamic left ventricle with an ejection fraction of 70-75% at      echocardiogram and possible left ventricular hypertrophy,      technically difficult study.  3. Hypertension.  4. Hyperlipidemia with a total cholesterol of 184, triglycerides 75,      HDL 51, LDL 118 on medical therapy.  5. History of thyroid nodule and possible hyperthyroidism with TSH      0.103, free T4 1.35 and free T3 2.2 this admission, followup with      Dr. Leslie Dales.  6. History of left adrenal adenoma.  7. Gastroesophageal reflux disease/peptic ulcer disease/hiatal hernia.  8. History of right lower extremity deep venous thrombosis in 2007, as      well as a hematoma in her right calf.  9. Status-post bilateral total knee replacements, left hip      replacement, abdominal hysterectomy, cholecystectomy and left      breast lumpectomy.  10.Ongoing tobacco use.  11.Family history of congestive heart failure in her sister.  12.Acute bronchitis, complete antibiotics as an outpatient.  13.History of blood loss anemia.  14.Obesity.  15.History of heme-positive stool.   TIME AT DISCHARGE:  Forty-two minutes.   HOSPITAL COURSE:  [name] is a 70 year old female with no previous  history of coronary artery disease.  She came to [facility] on  November 22, 2007 for chest tightness and palpitations.  She was seen  there by cardiology when cardiac enzymes were elevated and she was  having supraventricular tachycardia.   Her troponin-I peak was 1.41  and her CK-MB peak was 365/13.5.  An  echocardiogram was performed at [facility], which showed normal left  ventricular size with mild to moderate hypertrophy, hyperdynamic  regional global function with cavity obliteration during systole.  Because of the elevation in her cardiac enzymes in the SVT it was felt  that she needed further evaluation and was transferred to [facility] on  November 24, 2007.   Cardiac catheterization showed normal coronary arteries and LVH with an  EF of 80%.  Dr. Juanda Chance felt that the non-ST segment elevation MI was  secondary to demand ischemia.  Her TSH was noted to be low.  The free T3  and T4 were checked.  These were minimally abnormal, but it was felt  that followup with endocrinology was indicated and this has been  arranged.  An EP consult was called and Dr. Ladona Ridgel felt that  radiofrequency catheter ablation was indicated in this patient with  symptomatic SVT and elevated cardiac enzymes.  This was performed on  November 25, 2007.  The radiofrequency catheter ablation was successful  with nonsustained SVT still present with a different activation  sequence.   By November 26, 2007 she was sinus rhythm with a heart rate of 78.  She  had been diagnosed with bronchitis and started on Avelox, as well as an  inhaler for COPD and Mucinex to loosen congestion.  She was not febrile  and her wheezing and respiratory status had much improved with O2  saturation of 94% on room air.  She was seen by cardiac rehab and will  follow up with them as an outpatient.  She had elevated liver function  tests with an SGOT of 56  and an SGPT of 76 on admission.  Dr. Jens Som  felt that this was most likely secondary to the statin and this is less  than 2 times normal with no upward trend so this can be continued for  now with close outpatient followup.  Dr. Jens Som evaluated [name]  and felt she was stable for discharge on November 26, 2007 with close  outpatient followup.   DISCHARGE INSTRUCTIONS:  Her activity level is to be increased gradually  with no lifting for a week and no driving for 24 hours.  She is to stick  to a low-sodium diabetic diet.  She is to call our office for problems  with the cath site.  She is to return to Dr. Dietrich Pates on March 16 at  1:30.  She sees Dr. Leslie Dales on March 5 at 2:15 for labs and then to  see the M.D.  She is to followup with Dr. Loleta Chance as well.  She will need  an echocardiogram in 6 months.  She is encouraged to continue smoking  cessation.   DISCHARGE MEDICATIONS:  1. Aspirin 81 mg daily.  2. Lovastatin 40 mg daily.  3. Toprol XL 50 mg is on hold.  4. Protonix 40 mg b.i.d.  5. Cardizem CD 360 mg a day.  6. Mucinex 600 mg 2 tablets b.i.d.  7. Avelox 400 mg daily for 9 days.  8. Prednisone 10 mg as a taper starting with 40 mg.  9. Xopenex HFA 2 puffs b.i.d. and q.6 h p.r.n.      Theodore Demark, PA-C      Madolyn Frieze. Jens Som, MD, [facility]  Electronically Signed    RB/MEDQ  D:  11/26/2007  T:  11/27/2007  Job:  [id number]   cc:   Annia Friendly. Loleta Chance, MD  Veverly Fells. [place], M.D.

## 2011-02-19 NOTE — H&P (Signed)
[name], [name] NO.:  [id number]   MEDICAL RECORD NO.:  [id number]          PATIENT TYPE:  EMS   LOCATION:  ED                            FACILITY:  [facility]   PHYSICIAN:  Gardiner Barefoot, MD    DATE OF BIRTH:  May 19, 1941   DATE OF ADMISSION:  11/22/2007  DATE OF DISCHARGE:  LH                              HISTORY & PHYSICAL   PRIMARY CARE PHYSICIAN:  Mirna Mires, M.D.   CHIEF COMPLAINT:  Chest tightness.   HISTORY OF PRESENT ILLNESS:  This is a 70 year old female smoker with  history of hyperlipidemia, who woke up in the a.m. with palpitations and  chest tightness. Also reports some shortness of breath and light  headedness. No recent illnesses, though she does report drinking  excessive green tea recently. Noted to have BN SVT in the emergency  room, which was broken with 1 dose of 60 mg of Adenosine. No recent  fever or other complaints.   PAST MEDICAL HISTORY:  1. Hyperlipidemia.  2. Hypertension.  3. Bilateral knee replacements.   MEDICATIONS:  1. Toprol XL 50 mg daily.  2. Lovastatin 40 mg daily.  3. Aspirin 81 mg daily.   ALLERGIES:  NO KNOWN DRUG ALLERGIES.   SOCIAL HISTORY:  The patient is currently a smoker.   FAMILY HISTORY:  No early cardiac disease.   REVIEW OF SYSTEMS:  Negative except as per the history of present  illness.   PHYSICAL EXAMINATION:  VITAL SIGNS:  Pulse 91, respiratory rate 20,  blood pressure 113/93, and O2 sat is 97% on nasal cannula.  GENERAL:  Awake, alert, and oriented x3. Appears in no acute distress.  CARDIOVASCULAR:  Regular rate and rhythm. No murmur, rub, or gallop.  LUNGS:  Clear to auscultation bilaterally.  EXTREMITIES:  No clubbing, cyanosis, or edema.   LABORATORY DATA:  BNP is less than 30. Troponin less than 0.06. CK 339  with MB 3.5. Sodium 137, potassium 3.5, chloride 100, bicarb 29, BUN 7,  creatinine 0.83, glucose 135. WBC 4.6. Hemoglobin 15 and INR 1.0.   Initial EKG did show some SVT and  repeat after medication is sinus  tachycardia with some diffuse mild ST changes.   ASSESSMENT:  Supraventricular tachycardia, now resolved after Adenosine.   PLAN:  1. SVT. I will have patient seen by cardiology and avoid any caffeine      at this time. Will continue to cycle her enzymes. The patient, at      this time, is not in any chest pain or having any significant      symptoms. Will counsel on smoking cessation. Will trend her      enzymes.  2. Hypertension. Will continue with the patient's blood pressure      medications at this time.      Gardiner Barefoot, MD  Electronically Signed     RWC/MEDQ  D:  11/22/2007  T:  11/22/2007  Job:  [id number]

## 2011-02-19 NOTE — Assessment & Plan Note (Signed)
[facility]                            CARDIOLOGY OFFICE NOTE   DAYELIN, BALDUCCI                       MRN:          [id number]  DATE:01/18/2008                            DOB:          1940/10/14    Ms. [name] comes today to establish with me as her cardiologist.  She had  been seen by Dr. Dietrich Pates in the past but would prefer to change.   She is a delightful lady who is the wife of a patient of mine.   She had an episode of SVT in February 2009.  She ruled in for a non-ST  segment elevation MI at that time with a troponin of 1.41, CPK of 365,  and MB of 13.5.   She underwent cardiac catheterization which showed no obstructive  disease.  She had normal left ventricular systolic function with an  ejection fraction of 80%.  She does have mild-to-moderate left  ventricular hypertrophy presumed from hypertension and obesity.   She underwent EP study by Dr. Lewayne Bunting and had radiofrequency  ablation.  She has done very well with that.   She denies any recurrent tachy palpitations, presyncope, syncope, chest  pain, or shortness of breath.  She has gained a substantial amount of  weight since February when she quit smoking.   Her current medications are:  1. Cardizem CD 360 mg a day.  2. Mucinex 600 mg p.o. b.i.d.  3. Protonix 40 mg daily.  4. Lovastatin 20 mg a day.  5. Metoprolol 50 mg a day.  6. Enteric-coated aspirin 81 mg a day.  7. Benzonatate 100 mg q.8h.   Her exam, her blood pressure is 132/72, her pulse is 80 and regular.  Her weight is 250 pounds.  HEENT:  Normocephalic atraumatic.  PERRL.  Extraocular movements intact.  Sclerae are clear.  Facial symmetry is normal.  NECK:  Supple.  Carotids were equal bilaterally without bruits, no JVD.  Thyroid is not enlarged.  Trachea is midline.  LUNGS:  Were clear.  HEART:  Reveals a poorly appreciated PMI.  She has normal S1-S2 with a  S4.  ABDOMINAL EXAM:  Soft with good bowel  sounds.  EXTREMITIES:  Revealed no cyanosis, clubbing, or edema.  Pulses are  intact.  She is very thin legs for a lady her size.   I reviewed an EKG obtained recently on March 16.  This was essentially  normal.   ASSESSMENT/PLAN:  I have had about a 15-20 minute discussion with Francena Hanly  today.  I have gone through the entire clinical course and what exactly  happened, showing her by heart model here in the office.  I think she  feels much more comfortable with her new understanding of what has  transpired.   The most important thing for her to do now is to control her blood  pressure as well as her weight.  If she can continue not to smoke, that  is a big plus as we highlighted today.   We will plan on seeing her back in a year.     Thomas C.  Daleen Squibb, MD, [facility]  Electronically Signed    TCW/MedQ  DD: 01/18/2008  DT: 01/18/2008  Job #: [id number]   cc:   Annia Friendly. Loleta Chance, MD

## 2011-02-19 NOTE — Cardiovascular Report (Signed)
NAMECLARYCE, [name]                ACCOUNT NO.:  [id number]   MEDICAL RECORD NO.:  [id number]          PATIENT TYPE:  INP   LOCATION:  2034                         FACILITY:  [facility]   PHYSICIAN:  Everardo Beals. Juanda Chance, MD, FACCDATE OF BIRTH:  11/20/1940   DATE OF PROCEDURE:  11/24/2007  DATE OF DISCHARGE:                            CARDIAC CATHETERIZATION   CLINICAL HISTORY:  Ms. Stapp is 70 years old and was recently  hospitalized at [facility] with chest pain and supraventricular  tachycardia.  Her troponins returned positive for consistent with a non-  ST-elevation myocardial infarction.  She was seen in consultation by  Tereso Newcomer and Donnamarie Rossetti, and arrangements were made to transfer  her here for further evaluation.   PROCEDURE:  The procedure was performed via the femoral artery and  arterial sheath and 5-French preformed coronary catheters.  A front wall  arterial puncture was then performed and an Omnipaque contrast was used.  The patient tolerated the procedure well and left the laboratory in  satisfactory condition.  The right femoral artery was closed with Angio-  Seal.  She had received Lovenox 5 hours prior to the procedure.   RESULTS:  1. LEFT MAIN CORONARY was free of significant disease.  2. LEFT ANTERIOR DESCENDING ARTERY gave rise to a diagonal branch and      4 septal perforators.  These in the LAD proper were free of      significant disease.  3. CIRCUMFLEX ARTERY gave rise to an atrial branch, a marginal branch,      and a posterolateral branch.  These vessels were free of      significant disease.  4. RIGHT CORONARY ARTERY was a large vessel, gave rise to a conus      branch, a right ventricular branch, posterior branch, and two      posterolateral branches.  These vessels were free of significant      disease.   LEFT VENTRICULOGRAM:  Performed in the RAO projection showed vigorous  wall motion with a very small end systolic volume.  The estimated  ejection fraction was 80%.  There appeared to be left ventricle  hypertrophy.   HEMODYNAMIC DATA:  The aortic pressure was 188/102 with mean of 137, and  left ventricular pressure was 188/22.   CONCLUSION:  1. Normal coronary angiography.  2. Marked LVH by angiography.   RECOMMENDATIONS:  The patient's recent elevated troponins probably  represented demand infarct related to her marked LVH and her SVT.  We  will plan to get an echocardiogram to further evaluate her LVH.  This  could be LVH due to the hypertension or she might have a hypertrophic  cardiomyopathy.  Will start her on Cardizem for both blood pressure, and  for control of her SVT.      Bruce Elvera Lennox Juanda Chance, MD, [facility]  Electronically Signed     BRB/MEDQ  D:  11/24/2007  T:  11/25/2007  Job:  [id number]   cc:   Gerrit Friends. Dietrich Pates, MD, [facility]  Cardiopulmonary Lab

## 2011-02-22 NOTE — H&P (Signed)
[place]. [facility]  Patient:    [name], [name]                      MRN: [id number] Adm. Date:  12/19/00 Attending:  R. Valma Cava, M.D. Dictator:   Dorie Rank, P.A. CC:         Noralyn Pick. Eden Emms, M.D. [facility]  Dr. Katrinka Blazing   History and Physical  DATE OF BIRTH:  [birth year]  CHIEF COMPLAINT:  Left knee pain.  HISTORY OF PRESENT ILLNESS:  The patient has had a long history of left knee pain.  She underwent previous knee arthroscopies and injections to the left knee.  She has progressively had an increase in pain.  Her quality of life is low, her pain is interfering with her activities of daily living.  She cannot walk enough to do any shopping, etc.  On physical exam, she had a trace effusion to the left knee, her range of motion to the left knee is about 5-110 degrees.  She has diffuse tenderness to the left knee.  No ligamentous instability to the left knee.  Radiographs on October 13, 2000 showed advanced osteoarthritis, grade 3-4 changes of the lateral compartment; grade 3 medial compartment, extensive grade 3 changes to the patella/femoral joint.  She has tried nonsteroidals, which are no longer providing her with any relief.  P.O. analgesics provides ______ minimal relief now.  It was felt at that time, due to her pain and decreased ______  and x-rays, that she would benefit from undergoing a total knee arthroplasty to the left knee.  The risks and benefits were discussed as well as the procedure in great detail.  She elected to proceed with arthroplasty at that time.  PAST MEDICAL HISTORY: 1. 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 2. Hypertension. 3. Frequent bouts of bronchitis. 4. Colon polyps - had a colonoscopy in February 2002 with a biopsy of a polyp    which was benign.  Has had a arrhythmia while undergoing a left knee    arthroscopy in October 2001.  She did see a cardiologist for clearance for    this.  Dr. Eden Emms, of [facility]  cleared her for surgery on    November 18, 2000. 5. Obesity. 6. Gastroesophageal reflux disease, has tested positive for H. pylori, on    Prevacid.  PAST SURGICAL HISTORY:  Appendectomy as a child, in 1970 total abdominal hysterectomy, lumpectomy to the left breast-benign, October 2001 a left knee arthroscopy.  MEDICATIONS: 1. Prevacid 30 mg 1 p.o. q.d. 2. Xenical 120 mg 1 p.o. t.i.d. 3. Maxzide 37.5/25 1 p.o. q.d. 4. Vicodin 5 mg 1-2 p.o. q.4-6h. p.r.n. pain. 5. Celebrex 200 mg 1 p.o. q.d. 6. Advair p.r.n.  ALLERGIES:  No known drug allergies.  SOCIAL HISTORY:  Patient is married, lives in a one-level home.  She does have a husband who is currently going in for heart surgery.  She plans for home health physical therapy, although if she cannot arrange for help at home with her and her husband, she is open to going to rehab.  She smokes approximately 1/2 pack per week and is an occasional social drinker.  FAMILY HISTORY:  Mother deceased age 43, history of leukemia.  Father deceased age 68, history of emphysema.  REVIEW OF SYSTEMS:  GENERAL:  No fevers, night sweats, chills or bleeding tendencies.  CNS:  No blurred or double vision, seizures, headaches or paralysis.  RESPIRATORY:  Occasional shortness of  breath when she has bronchitis, no recent problems.  CARDIOVASCULAR:  No chest pain, angina or orthopnea.  ENDOCRINE:  No diabetes or thyroid disorders.  GASTROINTESTINAL:  No nausea, vomiting or diarrhea.  Did have some recent blood in stool and was evaluated with colonoscopy and was found to have benign colon polyps.  No constipation or diarrhea.  GENITOURINARY:  No dysuria, hematuria or discharge.  MUSCULOSKELETAL:  Left knee pain as described above under history of present illness.  PHYSICAL EXAMINATION:  GENERAL:  A 70 year old black obese female, very pleasant and conversant on physical exam.  HEENT:  Head is atraumatic, normocephalic.  NECK:  Supple,  negative for carotid bruits.  CHEST:  Lungs are clear to auscultation bilaterally.  No wheezes, rhonchi or rales.  BREASTS:  Not pertinent to present illness.  HEART:  S1, S2, negative for murmur, rub or gallop.  Heart is regular rate and rhythm.  ABDOMEN:  Soft and nontender, positive bowel sounds in all four quadrants. Her abdomen is round.  GENITOURINARY:  Not pertinent to present illness.  EXTREMITIES:  See history of present illness for physical exam to the left knee.  SKIN:  Acyanotic, no clubbing or lesions noted.  PREOPERATIVE LABORATORY DATA AND X-RAYS:  Pending at this time.  IMPRESSION: 1. Osteoarthritis, left knee. 2. Hypertension. 3. Chronic bronchitis. 4. Benign colon polyps.  PLANS:  Patient has scheduled a left total knee arthroplasty pending medical clearance from Dr. Katrinka Blazing in [place].  Has been cleared by Dr. Eden Emms of [facility]. DD:  12/12/00 TD:  12/13/00 Job: [id number]

## 2011-02-22 NOTE — Discharge Summary (Signed)
[place]. [facility]  Patient:    [name], [name]                       MRN: [id number] Adm. Date:  [id number] Disc. Date: [id number] Attending:  Erasmo Leventhal Dictator:   Alexzandrew L. Perkins, P.A.-C.                           Discharge Summary  ADMISSION DIAGNOSES: 1. Osteoarthritis left knee. 2. Hypertension. 3. Chronic bronchitis. 4. Benign colonic polyps.  DISCHARGE DIAGNOSES: 1. Left end-stage osteoarthritis left knee, status post left total knee    replacement arthroplasty. 2. Postoperative hyponatremia, improved. 3. Postoperative hypokalemia, improved. 4. Hypertension. 5. Chronic bronchitis. 6. Benign colonic polyps.  PROCEDURE:  The patient was taken to the OR and underwent a left total knee replacement arthroplasty with surgeon, R. Valma Cava, M.D., assistant Alexzandrew L. Perkins, P.A.-C.  The surgery was done under general anesthesia followed by epidural by J. Claybon Jabs, M.D., and Cliffton Asters. Ivin Booty, M.D. Tourniquet time was one hour and 30 minutes at 375 mmHg.  Two Hemovac drains were placed at the time of surgery.  COMPONENTS USED:  All Osteonics, all cemented, size 7 tibia, size 7 femur, 26 mm patella, 10 mm polyethylene tibial insert.  CONSULTS:  None.  BRIEF HISTORY:  The patient is a 70 year old female previously undergone knee arthroscopies and also injections into the left knee for aggressive left knee pain.  She is seen in the office and found to have end-stage osteoarthritis. She has been treated conservatively in the past but has also undergone arthroscopies. She has undergone interventricular injections, however, is not improved.  X-rays taken January of this year show extensive advanced osteoarthritis.  It is felt due to the fact the pain is interfering with her daily activities.  She would benefit from undergoing a total knee replacement. Risks and benefits were discussed.  She was admitted to the  hospital.  LABORATORY DATA:  CBC on admission showed hematocrit of 45.5, white count normal at 6.2.  Postoperative hemoglobin was noted at 10.5 and 31.5.  Last noted H&H prior to discharge was 9.1 and 27.2.  PT and PTT on admission were 13.1 and 32, respectively, with an INR of 1.0.  Serial protimes followed per Coumadin protocol.  PT/INR prior to discharge was 16.7 and 1.6, respectively. Chemistry panel on admission showed minimally elevated glucose of 116; remaining BMET normal except slightly elevated ALT of 48.  Follow-up BMET showed a low sodium and a low potassium postoperatively at 132 and 3.2, respectively.  Sodium came back to 138 prior to discharge.  The patient responded to potassium and her last noted potassium came back up to 3.5. Urinalysis on admission was negative.  Blood group type O positive.  EKG dated December 16, 2000, normal sinus rhythm, normal EKG.  No old traces to compare, confirmed by Aram Candela. Tysinger, M.D.  I do not see a chest x-ray report on this chart.  HOSPITAL COURSE:  The patient was admitted to [facility]. [facility], taken to the OR and underwent the above stated procedure.  The patient tolerated the procedure well.  She had two Hemovac drains placed at the time of surgery which were pulled on postoperative day #1.  The patient was noted to have a drop in sodium and drop in potassium, noting postoperative hypokalemia and hyponatremia. Fluids were adjusted.  BMETs were followed throughout the  hospital course.  Physical therapy was consulted to assist with gait training and ambulation while in the hospital.  Rehab services were consulted postoperatively.  The patient was doing quite well with physical therapy and felt she was not a candidate for rehab.  She progressed well and was ambulating approximately 150 feet by postoperatively day #2 and postoperative day #3.  It was felt due to her progression that we would discharge her home with home  health PT.  Dressing changes were initiated on postoperative day #2.  The incision was healing well.  The patient did respond to fluid adjustment management and potassium supplementation.  Her electrolyte imbalance improved. She was initially started on an epidural postoperatively. This was removed postoperatively and she was weaned over to p.o. analgesics. She did quite well with her pain control by postoperative day #4. She was up ambulating very well with physical therapy.  Discharge planning had arranged for post hospital home care and she was discharged home later that day on December 23, 2000.  DISCHARGE PLAN: 1. Discharge Diagnoses:    a. Left end-stage osteoarthritis left knee, status post left total knee       replacement arthroplasty.    b. Postoperative hyponatremia, improved.    c. Postoperative hypokalemia, improved.    d. Hypertension.    e. Chronic bronchitis.    f. Benign colonic polyps. 2. Discharge Medications:    a. Trinsicon #60 p.o. t.i.d.    b. Percocet #40 p.r.n. pain.    c. Robaxin 500 mg #40 p.r.n. spasm.    d. Coumadin as per pharmacy protocol. 3. Diet:  Low sodium diet. 4. Follow-up:  The patient is to follow up in two to two and a half weeks    with Dr. Thomasena Edis.  Call the office for an appointment. 5. Activity:  Weightbearing as tolerated to the left lower extremity.    Home Health PT, Home Health nursing per [facility].  Continue    with total knee protocol.  May shower five days after surgery.  DISPOSITION:  Home.  CONDITION ON DISCHARGE:  Improved. DD:  01/05/01 TD:  01/05/01 Job: [id number]

## 2011-02-22 NOTE — Discharge Summary (Signed)
NAMEALLIX, BLOMQUIST                ACCOUNT NO.:  [id number]   MEDICAL RECORD NO.:  [id number]          PATIENT TYPE:  INP   LOCATION:  2015                         FACILITY:  [facility]   PHYSICIAN:  Mobolaji B. Bakare, M.D.DATE OF BIRTH:  04/18/1941   DATE OF ADMISSION:  05/02/2006  DATE OF DISCHARGE:  05/06/2006                                 DISCHARGE SUMMARY   FINAL DIAGNOSES:  1.  Hematoma, right calf.  2.  Blood-loss anemia.  3.  Abnormal transaminases.  4.  Left adrenal adenoma.  5.  Obesity.  6.  Hypertension.  7.  Left thyroid nodule, as per report of CT scan.  8.  Hemoccult positive stool.   CONSULTS:  Vascular consult, Dr. Hart Rochester.   PROCEDURES:  1.  CT scan, right calf, shows extensive posterior right calf hematoma      between the Fisher planes of the gastrocnemius and soleus muscles.  2.  CT scan, abdomen and pelvis, shows 1.4 cm hypervascular lesion seen in      inferior right lobe of the liver.  A 2.3 cm left adrenal mass.  3.  Pelvic CT scan, no acute findings.  4.  MRI of the abdomen showed approximately 2.2 x 2.4 cm left adrenal      nodule, two 1.5 cm hemangioma inferiorly in the right upper lobe.  No      acute suspicious findings.  5.  Repeat ultrasound of the lower extremities shows DVT in the distal      posterior tibial vein, unable to rule out DVT and popliteal vein      secondary to large amounts of fluid.  All other veins appear thrombus-      free.   BRIEF HISTORY:  Ms. Blanchett is a pleasant 70 year old African-American female  who developed right calf pain two weeks prior to presentation to [facility].  She was first seen on April 26, 2006.  She had an ultrasound of  the calf which was initially reported as DVT in right popliteal vein.  The  patient was sent home on Lovenox and Coumadin.  She presented again on July  24th with increased swelling and pain, right calf.  She was admitted for  continued of anticoagulation.  She subsequently  developed bluish-black  discoloration over the calf and the sole of the right foot was not in pain.  Hence, she was transferred to [facility] for further evaluation by  sub specialist.  Please see admission H&P and discharge summary from [facility] for full details.   HOSPITAL COURSE:  1.  Right calf hematoma:  Ms. Headlee was assessed, and she did a right calf      CT scan.  Results are as noted above.  It was highly suggestive of      hematoma.  Hence, Lovenox and anticoagulation were further held.  The      question was should she have an IVC filter in view of the reported DVT.      Vascular consult was obtained.  Dr. Hart Rochester provided the consult.  Upon  review of the patient's records and review of the CT scan, he concluded      that she apparently had a previous spontaneous calf bleeding which      became exacerbated by anticoagulation.  He reviewed the previous      ultrasound done at [facility] with a radiologist, and the radiologist      did not see any clots in the right popliteal vein on this ultrasound.      We have repeated an ultrasound on the right lower extremity.  This did      show posterior tibial vein, which is below the calf.  This needs to be      monitored closely for extension and propagation.  I would recommend      repeat ultrasound and Doppler of the lower extremity in 10 days to      insure stability of this posterior tibial vein.  I believe at that time      the swelling involving the right calf would have subsided, and there      would be better visualization of the right calf veins.  She received      pain control.  She was instructed to elevate the right lower extremity.      Swelling has improved dramatically.  She was seen by physical therapist,      and recommendation was to use a roller walker.  Patient was empirically      started on antibiotics pending outcome of results.  This was continued      when it was determined she has  hematoma, right calf, prophylactically to      prevent infection, and she will complete treatment status post      discharge.  The patient has a history of left adrenal adenoma which has      been stable since 2005, according to previous imaging studies.   1.  Acute blood-loss anemia:  This is felt to be secondary to right calf      hematoma.  Hemoglobin dropped from 13 gm/dl to 10 gm/dl, and this      stabilized.  Prior to discharge, hemoglobin was 11 gm.  She did not need      blood transfusion.   1.  Abnormal transaminases:  LFTs were last reported to be normal in June,      2006.  Upon admission at [facility], AST and ALT were elevated at 88/77,      respectively.  Etiology of this was unclear.  She had a normal hepatitis      profile.  Ultrasound of the gallbladder was normal except for fatty      liver.  She was on statin at that time, and she has been on statin for      several years.  This was discontinued.  LFTs continued to rise during      hospitalization at [facility].  On admission at [facility], AST      was 167, ALT 227, alkaline phosphatase 80.  Bilirubin was normal      throughout the course.  On discharge, AST was 99, ALT 234, ALP 99.  It      is quite possible that these elevated liver enzymes would not be      bilirubin elevation, is coming from muscle damage.  These enzymes are      trending down.  Statin has been discontinued.  She should have repeat      LFTs  in one week.  It is quite possible that this is related to hepatic      steatosis and steatohepatitis.  Patient does not drink alcohol.  Given      the acuity of these enzyme elevations along with hematoma, right calf, I      would think it is most likely secondary to muscle injury from the      hematoma.   1.  Left adrenal adenoma, 2.4 cm:  Patient has no clinical sign of hormonal      abnormality.  Given the size of the lesion, we went ahead to obtain a 24-     hour urine for metanephrines,  catecholamines, cortisol.  Results of      these are pending.  She also did dexamethasone suppression test with 1      mg of dexamethasone at 11 p.m., and cortisol level was obtained at 8:00      a.m. the next day, results of which were not available prior to      discharge; however, at the time of dictation, results showed cortisol      level to be 11 mcg/dl.  Reference range for these low dose dexamethasone      suppression tests should be less than 5 mcg.  I will defer further      workup to Dr. Ardelle Park.  Results of 24-hour urine collection are      pending.   1.  Hypertension:  This was fairly controlled during this hospitalization.   1.  Dyslipidemia:  Statin has been held.  She has been advised on low fat      diet.  Further resumption of statin will be determined by Dr. Arlyce Dice.   1.  Left thyroid nodule:  This was incidentally found on imaging at [facility].  She had a normal TSH and free T4.  Further workup is      deferred to the outpatient setting.   1.  Heme positive stool:  Patient had 1 out of 2 stools heme positive.  She      did not have any frank bleeding.  Hemoglobin was stable during the      course of hospitalization.  This apparently dropped from [facility] from      13 to 11.  We did not pursue colonoscopy at this time.  Patient had the      colonoscopy in 2006 by Dr. Katrinka Blazing.  This was felt to be normal.   DISCHARGE MEDICATIONS:  1.  Toprol XL 50 mg p.o. daily.  2.  Prilosec 20 mg daily.  3.  Dilaudid 2 mg p.o. p.r.n. pain.  4.  Ferrous sulfate 1 p.o. daily.  5.  Keflex 500 mg b.i.d. x3 days.   RECOMMENDATIONS:  1.  Check CBC, liver function tests in one week.  2.  Follow up pending results, which include 24-hour urine collection for      metanephrines, catecholamines, and cortisol.  3.  Follow up further workup on thyroid nodule noted on imaging study, CT      angiogram of the chest done at [facility], which showed left thyroid      nodule.   She will need dedicated thyroid ultrasound for complete      evaluation.  4.  Repeat lower extremity Dopplers in one week to insure stability of      posterior tibial vein thrombus.   DISCHARGE CONDITION:  Stable.  Patient ambulating  well.   She was instructed to elevate her right leg 3-4 pillows.   DISCHARGE LABORATORY DATA:  White cells 8.3, hemoglobin 11, hematocrit 33.5,  platelets 508.  Sodium 128, potassium 3.9, chloride 103, bicarb 30.  BUN 8,  creatinine 0.3, glucose 116, calcium 9, AST 99, ALT 234, alkaline  phosphatase 99, bilirubin 0.5, albumin 2.5.      Mobolaji B. Corky Downs, M.D.  Electronically Signed     MBB/MEDQ  D:  05/07/2006  T:  05/07/2006  Job:  [id number]   cc:   Dr. Ardelle Park

## 2011-02-22 NOTE — Discharge Summary (Signed)
[name], [name]                ACCOUNT NO.:  [id number]   MEDICAL RECORD NO.:  [id number]          PATIENT TYPE:  INP   LOCATION:  A308                          FACILITY:  [facility]   PHYSICIAN:  Annia Friendly. 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  The patient was admitted with complaint of lower  abdominal pain.  The pain had been progressive over 1 week before admission.  She also experienced frequent stools over the past week.  She described the  stools as watery at times.  Lower abdominal pain was described as crampy in  nature.  She had attempted to reduce the incidence of diarrhea by watching  her oral intake and the use of antispasmodic without success.   Medical history was positive for hypertension, osteoarthritis,  diverticulosis.  History also significant for recent surgery for total right  knee replacement on January 10, 2005 in [place].  Patient had been taking  oxycodone for relief.  Past medical history was also positive for total  abdominal hysterectomy, appendectomy, left breast biopsy, left knee  replacement and left hip replacement.   Habits were positive for former cigarette smoker and positive for social use  of ethanol.  Patient denies street drug use.   HOSPITAL COURSE:  Problem 1. ACUTE ABDOMINAL PAIN SECONDARY TO SPASMODIC  COLON.  General appearance revealed a middle-aged, overweight, medium height  black female who appeared not to feel well but no apparent respiratory  distress.  Head normocephalic.  Lungs clear.  Heart audible S1 and S2  without murmur, regular rate and rhythm.  Abdomen obese and positive for mid  left lower quadrant tenderness and mid hypogastric tenderness on palpation,  no palpable mass, no organomegaly.  Significant labs  on admission were as  follows:  White count 5.9, hemoglobin 12.4, hematocrit 37.6, platelets  389,000, erythrocyte sedimentation rate 18, sodium 140, potassium 3.8,  chloride 105, CO2 29, glucose 120, BUN 6, creatinine 0.8, calcium 9.3, total  protein 6.4, albumin 3.6, AST 24, ALT 35, ALP 59, total bilirubin 0.5, serum  amylase 34, serum lipase 27 and urinalysis (specific gravity 1.015, pH 5, no  glucose, no hemoglobin, no bilirubin, no ketone, no protein, nitrite  negative).  The patient was treated with IV fluids, clear liquid diet,  ordered stool for Clostridium difficile, ova and leukocyte, stool culture,  analgesia for pain and other supportive measures.  The patient did  experience some blood in stool.  It was felt that this probably was related  to her diarrhea.  However, because of her age she did have a diagnostic  colonoscopy during this hospitalization.  The patient had a diagnostic  colonoscopy on March 13, 2005 by Dr. Maggie Schwalbe without complication.  Findings  were a few small scattered diverticula in the ascending colon and otherwise  normal colonoscopy.  The patient's diet was advanced to  normal.  She had no  complaint of nausea, vomiting, abdominal pain or diarrhea at time of  discharge.  The patient was discharged home on March 13, 2005.   Problem 2. HYPERTENSION.  Blood pressure on admission 153/92.  Heart exam  was within normal limits.  Lungs were clear.  Extremities demonstrated no  edema.  The patient was treated with Toprol XL 50 mg p.o. every day and a  low sodium diet.  Blood pressure was controlled during this hospitalization.  The patient did not experience any chest pain or palpitations or shortness  of breath during this hospitalization.   Problem 3. OSTEOARTHRITIS.  Examination of both knees demonstrated healed  surgical scars.  Examination of left hip revealed old healed surgical scar.  Patient was treated with IV morphine initially for pain.  She was also   treated with Tylenol 500 mg p.o. q.6h.  Patient was given Lovenox subcu  prophylactic for DVT.  Patient had no complaint of joint pain at time of  discharge.   Problem 4. HYPERLIPIDEMIA.  Patient was treated with Lovastatin 40 mg p.o.  every bedtime.  Liver function tests were within normal limits at time of  admission.  Fasting lipid profile demonstrated the following:  Total  cholesterol 195, triglyceride 141 mg/dL, HDL 53 mg/dL, LDL 161 mg/dL on March 10, 959 at 4540 hours.  Liver function tests will be repeated at least  yearly.  Patient had no complaint of muscle ache or soreness at time of  discharge.   INSTRUCTIONS AT TIME OF DISCHARGE:  1.  Diet:  Low sodium, low cholesterol.  2.  Activity:  No restrictions.  3.  Medications:      1.  Levsin 0.125 mg sublingual before every meal and bedtime.      2.  Omeprazole 20 mg p.o. once daily.      3.  Toprol XL 50 mg p.o. once daily.      4.  Librium 10 mg p.o. every day.      5.  Lovastatin 40 mg p.o. every bedtime.  4.  The patient is to follow up at the office in 2 weeks.   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   SECONDARY DIAGNOSES:  1.  Hypertension.  2.  Osteoarthritis.  3.  Hyperlipidemia.   ADDENDUM:  Stool culture demonstrated no suspicious colonies.  Stools were  also negative for wbc's, Clostridium difficile or ova and parasites.       GKH/MEDQ  D:  03/16/2005  T:  03/16/2005  Job:  [id number]

## 2011-02-22 NOTE — Discharge Summary (Signed)
[name], [name]                ACCOUNT NO.:  [id number]   MEDICAL RECORD NO.:  [id number]          PATIENT TYPE:  INP   LOCATION:  A310                          FACILITY:  [facility]   PHYSICIAN:  Osvaldo Shipper, MD     DATE OF BIRTH:  [birth year]   DATE OF ADMISSION:  04/29/2006  DATE OF DISCHARGE:  LH                                 DISCHARGE SUMMARY   TRANSFER SUMMARY   The patient is being transferred to [facility]   Please review H&P dictated by Dr. Josefine Class for details regarding the  patient's presenting illness.   DISCHARGE DIAGNOSES:  1.  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  2.  Concern for possible skin necrosis versus cholesterol embolization.  3.  Acute hepatitis of unclear etiology.  4.  Left thyroid nodule requiring outpatient followup.  5.  History of hypertension.  6.  History of dyslipidemia.   BRIEF HOSPITAL COURSE:  Briefly, this is a 70 year old African American  female with hypertension, dyslipidemia, acid reflux disease, who was  diagnosed with a right lower extremity DVT on April 26, 2006.  The patient  was put on Lovenox and Coumadin.   However, she presented back on July 24 to the ED complaining of increasing  swelling, pain and redness over her skin.  The patient was thought to have  possibly postphlebitic complications of her DVT.  She was admitted to the  hospital and was continued on subcutaneous full-dose Lovenox and her  Coumadin dosage was increased.  The patient was also given compression  stockings to wear and her leg was elevated.   The patient has been here for about three days and there has not been an  appreciable change in her symptoms.  Her right leg is still pretty swollen  up.  The calf is tender to palpation and she has erythema over that area.  This morning the patient pointed out some dark area on her foot.  On the  medial aspect of her foot below the medial malleolus there  is an  approximately 10-cm area of blackish discoloration.  The patient does not  have any tenderness over there, but she is complaining of numbness in that  area as well as over the sole of her right foot.  Her dorsalis pedis as well  as posterior tibial are well palpable.  I am concerned that the patient  might have developed cholesterol microemboli and/or maybe experiencing early  signs of skin necrosis as the result of the Coumadin.  The patient could  have protein C or S deficiency which we have not evaluated for at this time.   Hepatitis.  The patient initially presented with AST of 88 and ALT of 77.  Her liver functions from June 2006 were completely normal.  Over the period  of the next three days, her AST has gone up to 210, ALT 207.  Hepatitis  profile is unremarkable.  Liver ultrasound was done which showed just fatty  liver with no other abnormalities.  She has been on  a statin; however, she  has been on it for at least 71 years, hence that is an unlikely etiology  for this acute elevation in her LFTs.  I have in any case held her statin  for now.  I am concerned that her acute hepatitis could be secondary to  Coumadin.  Coumadin is known to cause acute elevation in liver enzymes and  cause hepatitis.  Lovenox was also known to cause transaminase elevation.   The patient also has anemia which is stable.  Iron profile studies have been  done which suggest anemia of chronic disease.  One stool was positive for  occult blood.  Her high blood pressure also remains stable during this  admission.  She had a CT scan of her chest to rule out PE which did rule it  out; however, it showed a left thyroid nodule and a thyroid ultrasound was  recommended which we were thinking of getting done as an outpatient.   In summary, this patient presented with right DVT and has undergone  complications with postphlebitic syndrome.  I am also very concerned about  the patient developing  cholesterol embolization versus early signs of skin  necrosis over her right foot area.  I have discussed this case with Dr.  Sharon Seller, internal medicine hospitalist with incompass at [facility], and  have explained this case to him.  This patient will likely need to be seen  by vascular surgery as well as get evaluations from gastroenterologist and  hematologist.  The patient will need to have protein C and S levels checked.  If her LFTs do not come down or if her skin gets worse, the anticoagulation  may have to be withheld at least in the form of Coumadin.  Alternative  anticoagulation may have to be considered, and the patient may need to have  a  [place] filter placed.  Because the patient is now requiring  multispecialty input, at least one of which is not present in this hospital  at all and we do not have around-the-clock or daily hematology coverage, I  requested that the patient be transferred to [facility].  Dr.  Sharon Seller agreed to accept this patient, and the patient is being transferred  over there as this dictation is being done.   Currently, the patient is on the following medications:  1.  She is on Lovenox 110 mg subcutaneously every 12.  2.  She is on Toprol XL 50 mg daily.  3.  Protonix 40 mg daily.  4.  Senokot one tablet b.i.d.  5.  Her Zocor is being held.  6.  She is on Coumadin 7.5 mg every p.m.   IMAGING STUDIES:  1.  CT chest.  2.  Ultrasound of abdomen.  3.  Chest x-ray.   All of which have been discussed above.  Chest x-ray showed question of  small nodular density along the left lung base which was not really noticed  on the CT scan.   No procedures have been done during this admission.   No consultations have been obtained.   Final disposition to be determined at [facility].      Osvaldo Shipper, MD  Electronically Signed     GK/MEDQ  D:  05/02/2006  T:  05/02/2006  Job:  [id number]   cc:   Lonia Blood,  M.D. Fax: [phone]   Annia Friendly. Loleta Chance, MD  Fax: [phone]

## 2011-02-22 NOTE — H&P (Signed)
[name], [name]                ACCOUNT NO.:  [id number]   MEDICAL RECORD NO.:  [id number]          PATIENT TYPE:  INP   LOCATION:  A310                          FACILITY:  [facility]   PHYSICIAN:  Hanley Hays. Dechurch, M.D.DATE OF BIRTH:  1940-10-30   DATE OF ADMISSION:  04/29/2006  DATE OF DISCHARGE:  LH                                HISTORY & PHYSICAL   70 year old African-American female followed by Dr. Mirna Mires who was in  her usual state of health until about two weeks prior when she noted some  pain in her right lower extremity.  She presented to the emergency room on  July 20 when she noted increasing pain and some swelling.  At that time, her  D-dimer was elevated and she was empirically started on Lovenox with follow-  up ultrasound the next day which revealed a partial occlusive popliteal vein  DVT.  She was discharged on outpatient Lovenox to continue and Coumadin at  2.5 mg daily. Over the course of the last three days, she has not been able  to sleep secondary to pain.  She has had increased swelling. She has had  poor appetite.  She complains of constipation and generally feeling  miserable.  Her husband is unable to assist her giving the Lovenox and  apparently he is in poor health and requires a fair amount of assistance and  she is unable to with her right lower extremity pain.  She has also had some  increased dyspnea with exertion over the last several days which is unusual.  She denies any dyspnea at rest.  She has had no chest pain or PND.  She has  no previous history of DVT.  She has apparently no history of prolonged  sitting car rides, etc., which would predispose her, she has had no recent  trauma.  She is usually quite active and bowls.  She is status post  bilateral knee replacements as well as left hip replacement but had no  complications with DVT in the past.   PAST MEDICAL HISTORY:  This is also remarkable for cholelithiasis status  post  cholecystectomy by Dr. Katrinka Blazing, gastroesophageal reflux with hiatal  hernia, and obesity as well as degenerative joint disease.  She also notes  spasmodic colon, history of colon polyps in 2002, and history of  hypertension.  Hysterectomy, history of benign breast lumpectomy, and remote  history of appendectomy.   CURRENT MEDICAL REGIMEN:  Includes Toprol XL 50 mg daily, lovastatin 40 mg  daily, Prilosec 20 mg daily, Lovenox 100 mg b.i.d. since the 20th, and  Coumadin 2.5 daily, and Dilaudid 2 mg daily.   ALLERGIES:  She has no known drug allergies.   FAMILY HISTORY:  She is married.  She has one son who assist with her  husband's needs, one stepson, all in good health. She has several siblings  with no significant disease, specifically no history of DVT or PE.  Her  parents are deceased and, again, unremarkable family history.   SOCIAL HISTORY:  Remarkable for about one pack per day of tobacco abuse.  No  alcohol or drug abuse.  She is a Futures trader.   REVIEW OF SYSTEMS:  Pertinent for 20 or more pound weight gain over the last  year, and otherwise, as noted as per the HPI.  No GI or GU complaints.   PHYSICAL EXAMINATION:  GENERAL:  Obese female alert, appropriate, in no distress.  VITAL SIGNS:  Pulse is 84 and regular, respirations are unlabored, blood  pressure 130/88  NECK:  Supple, no JVD.  No bruits.  Oropharynx is clear.  LUNGS:  Clear to auscultation though diminished at the bases.  HEART:  Regular, I cannot appreciate a murmur or gallop.  ABDOMEN:  Obese, soft and nontender.  EXTREMITIES: No clubbing or cyanosis.  The right lower extremity reveals  taut edema from just above the knee to the foot, diffusely tender, but no  true cellulitis.  The left lower extremity is unremarkable.  The lady does  have obvious centripetal obesity.   LABORATORY DATA:  Remarkable for PTT of 45, INR is 1.1, glucose 110, SGOT  88, SGPT 77. Normal alkaline phosphatase. BUN is 7, creatinine 0.9.   Hemoglobin 11.8 as opposed to 13.5 on July 28.  Normal platelets   ASSESSMENT/PLAN:  1.  DVT with post phlebitic syndrome.  2.  Given the fact the patient has been ambulatory and subtherapeutic, the      question is whether the patient has not had a PE, given her dyspnea with      exertion.  Will proceed with CT scan. O2 sats pending.  We will check a      12 lead EKG to be complete.  She will continue her Lovenox and Coumadin      5 mg today.  Will fit for right lower extremity compression stocking      hopefully to decrease the risk of further problems with pain. Begin      morphine. She has received a dose of Toradol and morphine here in the      emergency room with some relief.  3.  Constipation.  Given the fact her appetite is poor and limited mobility,      will use Senokot and monitor.  4.  Anemia.  She has had a 2 gram drop in hemoglobin.  We will heme check      her stools and monitor.  She does have relatively low MCV likely to be      iron deficient.  5.  Elevated transaminases.  Certainly this may be related to fatty liver,      less likely passive congestion. In any event, will monitor and further      workup as indicated.  6.  Hypertension.  Continue her Toprol and monitor.  7.  History of hyperlipidemia on lovastatin which we will continue.  8.  Gastroesophageal reflux and known hiatal hernia.  Will maintain PPI      while on anticoagulation.   The plan is discussed with the patient who seems to have reasonable  understanding,      Scarlette Calico E. Josefine Class, M.D.  Electronically Signed     FED/MEDQ  D:  04/29/2006  T:  04/29/2006  Job:  [id number]

## 2011-02-22 NOTE — Consult Note (Signed)
[name], [name]                ACCOUNT NO.:  [id number]   MEDICAL RECORD NO.:  [id number]          PATIENT TYPE:  INP   LOCATION:  2015                         FACILITY:  [facility]   PHYSICIAN:  Quita Skye. Hart Rochester, M.D.  DATE OF BIRTH:  Aug 05, 1941   DATE OF CONSULTATION:  05/03/2006  DATE OF DISCHARGE:                                   CONSULTATION   REFERRED BY:  [facility] team D.   CHIEF COMPLAINT:  Swelling of right calf, possible right leg DVT, and  possible need for inferior vena caval filter secondary to bleeding.   HISTORY OF PRESENT ILLNESS:  This 70 year old female developed sudden onset  of right calf pain and swelling about eight days ago. Was seen in [facility]  emergency department where she was diagnosed with a right popliteal deep  venous thrombosis by duplex scanning. She was started on outpatient Lovenox  and Coumadin. Was discharged to the primary care M.D., Dr. Loleta Chance. She was  seen by him on April 28, 2006 and because of increasing and persistent  worsening pain and swelling of the right leg, she was admitted and  eventually transferred to [facility] because of increasing edema,  numbness, and paresthesias of the right foot. She had no history of previous  deep venous thrombosis, thrombophlebitis, pulmonary emboli or other clotting  problems. She has had a slight decrease in her hemoglobin from 13 g to 10 g  over the past 5-7 days. Has also had some increased liver function studies.   PAST MEDICAL HISTORY:  1.  Hypertension.  2.  Hyperlipidemia.  3.  Negative for coronary artery disease, diabetes, stroke, and COPD.   PAST SURGICAL HISTORY:  Cholecystectomy, hysterectomy, right total knee  replacement, left total hip replacement.   ALLERGIES:  None known.   FAMILY HISTORY:  Negative for coronary artery disease, diabetes, and stroke.   PHYSICAL EXAMINATION:  VITAL SIGNS:  Blood pressure 120/60, heart rate 94,  respirations 12, temperature 99.8.  GENERAL:  She is a healthy-appearing female in no apparent distress  complaining of pain in the right calf which has been improving slightly. She  is alert and oriented x3.  NECK:  Supple with 3+ carotid pulses. Thyroid is slightly enlarged.  CARDIOVASCULAR:  Upper extremity pulses 3+ bilaterally.  CHEST:  Clear to auscultation.  ABDOMEN:  Soft, nontender with no palpable masses.  EXTREMITIES:  Reveals 3+ femoral, popliteal, dorsalis pedis and posterior  tibial pulses palpable bilaterally. Left leg is unremarkable. Right leg has  no thigh edema. She does have tenderness and swelling in the right calf  posteriorly which seems fairly well localized to the gastrocnemius muscle  area. There is no edema in the right ankle or foot. She has excellent pulses  in both lower extremities including 3+ popliteal and dorsalis pedis and  posterior tibial pulses. There is some mild bruising on the medial aspect of  the right ankle but no evidence of any skin necrosis. She does have  dorsiflexion of her right foot which is slightly decreased and some mild  numbness on the sole of the foot  which she states is improved.   IMPRESSION:  Possible gastrocnemius bleed either secondary to the  anticoagulation or as the initial event, although duplex scanning apparently  revealed a deep venous thrombosis in the right popliteal vein.   PLAN AND RECOMMENDATIONS:  Will repeat duplex scanning today to look to  confirm the DVT in the popliteal vein. Also obtain CT scan of the right leg  to look for a hematoma on the right calf. I would hold off on any  anticoagulants until the definitive diagnosis of DVT is confirmed. See no  need for any inferior vena caval filter at this point.           ______________________________  Quita Skye. Hart Rochester, M.D.     JDL/MEDQ  D:  05/03/2006  T:  05/03/2006  Job:  [id number]

## 2011-02-22 NOTE — H&P (Signed)
NAMEEARTHA, [name]                ACCOUNT NO.:  [id number]   MEDICAL RECORD NO.:  [id number]          PATIENT TYPE:  INP   LOCATION:  A308                          FACILITY:  [facility]   PHYSICIAN:  Dirk Dress. Katrinka Blazing, M.D.   DATE OF BIRTH:  01/28/1941   DATE OF ADMISSION:  03/09/2005  DATE OF DISCHARGE:  LH                                HISTORY & PHYSICAL   A 70 year old female with three to four-week history of recurrent abdominal  pain without nausea or vomiting.  The patient has had crampy hypogastric  pain with increasing mucus.  The pain increases with any p.o. intake.  She  attributes this to the oxycodone that she was taking for right total knee  replacement which occurred on 6 April.  She stated initially that she had  fever and chills but later retracted that.  She states that her pain has  slightly improved over the last day or so.  She still has 4 or 5 watery  stools per day.  She was admitted by Dr. Loleta Chance, and the plan is to try to  control her diarrhea, control her pain, and then probably continue workup as  an outpatient.   PAST MEDICAL HISTORY:  1.  Hypertension.  2.  Osteoarthritis.  3.  Diverticulosis.  4.  Prior history of H. pylori-positive gastritis.   PAST SURGICAL HISTORY:  1.  Total abdominal hysterectomy.  2.  Appendectomy.  3.  Left breast biopsy.  4.  Right total knee replacement.  5.  Left total knee replacement.  6.  Left hip replacement.   SOCIAL HISTORY:  She is married, retired.  She used to smoke but does not  smoke at this time.  She takes a rare alcoholic beverage.  There is no  history of drug use.   PHYSICAL EXAMINATION:  VITAL SIGNS:  Blood pressure 153/92, pulse 76,  respirations 20, temperature 99.  HEENT:  Unremarkable.  NECK:  Supple.  No JVD, bruit, adenopathy, or thyromegaly.  CHEST:  Clear to auscultation.  No rales, rubs, rhonchi, or wheezes.  HEART:  Regular rate and rhythm without murmur, gallop, or rub.  ABDOMEN:  Obese, soft,  with mild left lower quadrant and hypogastric  tenderness.  Normoactive bowel sounds.  EXTREMITIES:  Positive changes in both knees, old scar of left hip.  There  is no peripheral edema.  Range of motion of her knees appear to be intact.  NEUROLOGIC:  No focal motor, sensory, or cerebellar deficits.   IMPRESSION:  1.  Chronic recurrent abdominal pain with progressive diarrhea, not      responsive to outpatient treatment x 3 weeks.  2.  Hypertension.  3.  Osteoarthritis.  4.  History of diverticulosis.   PLAN:  1.  The patient will receive IV fluids.  2.  She will be placed on liquids.  3.  We will get stool cultures, check for C. difficile.  4.  She will be started on oral fluoroquinolones.  5.  She will probably need to have colonoscopy.  6.  She will then be placed on antidiarrheals.  7.  After she is stabilized, we will probably complete the workup as an      outpatient if her diarrhea is controlled.       LCS/MEDQ  D:  03/10/2005  T:  03/10/2005  Job:  [id number]

## 2011-02-22 NOTE — Op Note (Signed)
[place]  Patient:    [name], [name]                      MRN: [id number] Proc. Date: 12/19/00 Attending:  R. Valma Cava, M.D.                           Operative Report  PREOPERATIVE DIAGNOSIS:  Left knee end-stage osteoarthritis.  POSTOPERATIVE DIAGNOSIS:  Left knee end-stage osteoarthritis.  PROCEDURE:  Left total knee arthroplasty.  SURGEON:  R. Valma Cava, M.D.  ASSISTANT:  Alexzandrew L. Perkins, P.A.-C.  ANESTHESIA:  General, followed by epidural by Dr. Krista Blue, then Dr. Ivin Booty.  ESTIMATED BLOOD LOSS:  Less than 200 cc.  TOURNIQUET TIME:  One hour and 30 minutes at 375 mmHg.  DISPOSITION:  PACU-stable.  COMPLICATIONS:  None.  DRAINS:  Two Hemovacs.  OPERATIVE IMPLANTS:  Osteonics components, all cemented.  Size #7 tibia, size #7 femur, 10 mm polyethylene insert, posterior stabilized.  A 26 mm polyethylene patella.  DESCRIPTION OF PROCEDURE:  Patient was counselled in the holding area, correct side identified, IV started and antibiotics were given.  He was taken to the OR, placed in the supine position under general anesthesia.  Foley catheter was placed utilizing sterile technique by the OR circulating nurse.  Left knee examined, 3 degrees flexion contracture, flexed to 110 degree. Elevated, prepped with Duraprep, draped in a sterile fashion.  Exsanguinated with Esmarch, tourniquet was inflated to 375 mmHg.  Standard anterior midline incision made through the skin and subcutaneous tissue.  She had very thin skin flaps medially and lateral and these were kept viable and we were very meticulous with development of the flaps.  Medial parapatellar arthrotomy was performed, patella was everted, end-stage osteoarthritic changes medial compartment bone-on-bone and advanced lateral and patellofemoral.  Medial soft tissues were released from proximal medial tibia.  Cruciate ligaments were resected.  A starting hole made in  distal femur, canal was irrigated and intramedullary rod was placed and we set it for 5 degree valgus and took a 10 mm cut off the distal femur.  Distal femur was found to be a size #7, rotational marks were made and distal femur was cut appropriately.  Medial and lateral menisci were removed, geniculates were coagulated, tibial eminence was resected and osteophytes removed from proximal medial tibia.  Proximal tibia was found to be a size #7.  Starting hole was made, step reamer was utilized, canal was irrigated, intramedullary rod was gently placed.  We chose a 5 degree posterior slope and took a 10 mm cut based upon the lateral side. Posteromedial and posterolateral femoral osteophytes were removed, femoral trochlea was prepared in the standard fashion.  Patella was found to be a Delta keel was performed in a standard fashion on the tibia.  Patella was found to be 22 mm thick.  It was reamed to a depth of 10 mm, locking holes were made and excess bone was removed for a size 26 polyethylene patella.  At this point in time utilizing modern cement technique, all components were cemented into place, size #7 tibia, size #7 femur, 26 mm patella.  At this point in time, with tibial trial inserts she had an excellent range of motion flexion/extension, well balanced soft tissue with a 10 mm polyethylene insert. The knee was then flexed, trial was removed and put in place a 10 mm polyethylene insert.  Excess cement had been  removed. Bone wax placed in the intercondylar notch region.  At this point in time, lateral patellofemoral tracking was just a bit lateral and a lateral release was performed giving [place] anatomic patellofemoral tracking.  Wounds were copiously irrigated.  Two medium Hemovac drains were placed.  Synovium closed with Vicryl, arthrotomy Vicryl, wound was then packed with sponges, Ace wrap was applied and tourniquet was deflated for hemostasis.  At this point in time, the  sponges were then removed and we obtained a meticulous hemostasis.  The subcu closed with Vicryl suture.  The skin was closed with running subcuticular Monocryl sutures.  Benzoin and Steri-Strips were applied.  Sterile dressing applied. The drains were then hooked to suction.  She had normal pulses in foot and ankle at the end of the case.  Sterile dressing was applied.  Ice pack and knee immobilizer in full extension.  She was also given another g of Ancef intravenously and the tourniquet deflated.  Following this, she then had the epidural catheter placed per anesthesiologist and then she was awakened, extubated and taken from the operating room to PACU in stable condition.  There were no complications.  Sponge and needle counts were correct.  Patient did well during the surgical procedure. DD:  12/19/00 TD:  12/19/00 Job: [id number]

## 2011-03-02 ENCOUNTER — Other Ambulatory Visit: Payer: Self-pay | Admitting: Cardiology

## 2011-06-28 LAB — BASIC METABOLIC PANEL
BUN: 12
CO2: 27
CO2: 29
Calcium: 8.7
Calcium: 9.1
GFR calc Af Amer: >60
GFR calc Af Amer: >60
GFR calc Af Amer: >60
GFR calc Af Amer: >60
GFR calc non Af Amer: >60
GFR calc non Af Amer: >60
GFR calc non Af Amer: >60
Glucose, Bld: 183 — ABNORMAL HIGH
Potassium: 3.9
Potassium: 3.6
Potassium: 4.2
Sodium: 138
Sodium: 137
Sodium: 137
Sodium: 137

## 2011-06-28 LAB — CBC
HCT: 39.2
HCT: 38.8
Hemoglobin: 13.3
Hemoglobin: 15.2 — ABNORMAL HIGH
MCHC: 34.2
MCHC: 34.3
Platelets: 269
RBC: 4.74
RBC: 5.39 — ABNORMAL HIGH
RDW: 14
WBC: 7

## 2011-06-28 LAB — BLOOD GAS, ARTERIAL
Acid-Base Excess: 1.1
Bicarbonate: 25.1 — ABNORMAL HIGH
O2 Saturation: 92.6
TCO2: 22.3
pCO2 arterial: 39.7
pO2, Arterial: 64.3 — ABNORMAL LOW

## 2011-06-28 LAB — DIFFERENTIAL
Basophils Absolute: 0
Eosinophils Absolute: 0
Lymphocytes Relative: 24
Lymphocytes Relative: 9 — ABNORMAL LOW
Monocytes Absolute: 0.3
Monocytes Relative: 6
Neutro Abs: 2.7
Neutro Abs: 4
Neutrophils Relative %: 60

## 2011-06-28 LAB — HEPATIC FUNCTION PANEL
ALT: 76 — ABNORMAL HIGH
AST: 56 — ABNORMAL HIGH
Albumin: 3 — ABNORMAL LOW
Alkaline Phosphatase: 47
Total Bilirubin: 0.4
Total Protein: 6.1

## 2011-06-28 LAB — LIPID PANEL
LDL Cholesterol: 118 — ABNORMAL HIGH
VLDL: 15

## 2011-06-28 LAB — CARDIAC PANEL(CRET KIN+CKTOT+MB+TROPI)
CK, MB: 13.5 — ABNORMAL HIGH
CK, MB: 6.3 — ABNORMAL HIGH
Relative Index: 0.9
Total CK: 365 — ABNORMAL HIGH
Total CK: 447 — ABNORMAL HIGH
Total CK: 693 — ABNORMAL HIGH
Troponin I: 0.62
Troponin I: 1.41

## 2011-06-28 LAB — TROPONIN I: Troponin I: 0.06

## 2011-06-28 LAB — PROTIME-INR
INR: 1
Prothrombin Time: 13.1

## 2011-06-28 LAB — POCT CARDIAC MARKERS
CKMB, poc: 6.6
Myoglobin, poc: 165
Troponin i, poc: 0.39 — ABNORMAL HIGH

## 2011-06-28 LAB — HEMOGLOBIN A1C
Hgb A1c MFr Bld: 6.2 — ABNORMAL HIGH
Mean Plasma Glucose: 143

## 2011-06-28 LAB — CK TOTAL AND CKMB (NOT AT ARMC)
CK, MB: 3.5
Relative Index: 1

## 2011-06-28 LAB — B-NATRIURETIC PEPTIDE (CONVERTED LAB)
Pro B Natriuretic peptide (BNP): 40.9
Pro B Natriuretic peptide (BNP): <30

## 2011-07-08 LAB — COMPREHENSIVE METABOLIC PANEL
ALT: 27
AST: 19
Calcium: 9
Creatinine, Ser: 0.86
GFR calc Af Amer: >60
Glucose, Bld: 153 — ABNORMAL HIGH
Sodium: 141
Total Protein: 6.6

## 2011-07-08 LAB — DIFFERENTIAL
Eosinophils Absolute: 0
Lymphocytes Relative: 14
Lymphs Abs: 1.1
Monocytes Relative: 1 — ABNORMAL LOW
Neutrophils Relative %: 84 — ABNORMAL HIGH

## 2011-07-08 LAB — CBC
MCHC: 33
MCV: 83
RDW: 15

## 2011-07-08 LAB — URINALYSIS, ROUTINE W REFLEX MICROSCOPIC
Bilirubin Urine: NEGATIVE
Ketones, ur: NEGATIVE
Nitrite: NEGATIVE
Urobilinogen, UA: 0.2

## 2011-08-01 ENCOUNTER — Other Ambulatory Visit: Payer: Self-pay | Admitting: Cardiology

## 2011-09-08 ENCOUNTER — Other Ambulatory Visit: Payer: Self-pay

## 2011-09-08 ENCOUNTER — Encounter: Payer: Self-pay | Admitting: *Deleted

## 2011-09-08 ENCOUNTER — Emergency Department (HOSPITAL_COMMUNITY): Payer: Medicare Other

## 2011-09-08 ENCOUNTER — Emergency Department (HOSPITAL_COMMUNITY)
Admission: EM | Admit: 2011-09-08 | Discharge: 2011-09-08 | Disposition: A | Discharge status: Home / Self Care | Payer: Medicare Other | Attending: Emergency Medicine | Admitting: Emergency Medicine

## 2011-09-08 DIAGNOSIS — Z7982 Long term (current) use of aspirin: Secondary | ICD-10-CM | POA: Insufficient documentation

## 2011-09-08 DIAGNOSIS — I10 Essential (primary) hypertension: Secondary | ICD-10-CM | POA: Insufficient documentation

## 2011-09-08 DIAGNOSIS — K219 Gastro-esophageal reflux disease without esophagitis: Secondary | ICD-10-CM | POA: Insufficient documentation

## 2011-09-08 DIAGNOSIS — R10816 Epigastric abdominal tenderness: Secondary | ICD-10-CM | POA: Insufficient documentation

## 2011-09-08 DIAGNOSIS — F17200 Nicotine dependence, unspecified, uncomplicated: Secondary | ICD-10-CM | POA: Insufficient documentation

## 2011-09-08 DIAGNOSIS — Z96659 Presence of unspecified artificial knee joint: Secondary | ICD-10-CM | POA: Insufficient documentation

## 2011-09-08 DIAGNOSIS — K279 Peptic ulcer, site unspecified, unspecified as acute or chronic, without hemorrhage or perforation: Secondary | ICD-10-CM | POA: Insufficient documentation

## 2011-09-08 DIAGNOSIS — I498 Other specified cardiac arrhythmias: Secondary | ICD-10-CM | POA: Insufficient documentation

## 2011-09-08 DIAGNOSIS — R109 Unspecified abdominal pain: Secondary | ICD-10-CM | POA: Insufficient documentation

## 2011-09-08 DIAGNOSIS — R1110 Vomiting, unspecified: Secondary | ICD-10-CM | POA: Insufficient documentation

## 2011-09-08 HISTORY — DX: Essential (primary) hypertension: I10

## 2011-09-08 LAB — COMPREHENSIVE METABOLIC PANEL
ALT: 18 U/L (ref 0–35)
Albumin: 3.7 g/dL (ref 3.5–5.2)
Alkaline Phosphatase: 69 U/L (ref 39–117)
BUN: 12 mg/dL (ref 6–23)
Chloride: 100 mEq/L (ref 96–112)
GFR calc Af Amer: 84 mL/min — ABNORMAL LOW (ref >90)
Glucose, Bld: 102 mg/dL — ABNORMAL HIGH (ref 70–99)
Potassium: 3.8 mEq/L (ref 3.5–5.1)
Sodium: 138 mEq/L (ref 135–145)
Total Bilirubin: 0.4 mg/dL (ref 0.3–1.2)
Total Protein: 7.9 g/dL (ref 6.0–8.3)

## 2011-09-08 LAB — CBC
HCT: 45 % (ref 36.0–46.0)
Hemoglobin: 14.8 g/dL (ref 12.0–15.0)
MCV: 84.7 fL (ref 78.0–100.0)
RBC: 5.31 MIL/uL — ABNORMAL HIGH (ref 3.87–5.11)
RDW: 14 % (ref 11.5–15.5)
WBC: 6.9 10*3/uL (ref 4.0–10.5)

## 2011-09-08 LAB — DIFFERENTIAL
Basophils Absolute: 0 10*3/uL (ref 0.0–0.1)
Eosinophils Relative: 1 % (ref 0–5)
Lymphocytes Relative: 39 % (ref 12–46)
Lymphs Abs: 2.7 10*3/uL (ref 0.7–4.0)
Monocytes Absolute: 0.4 10*3/uL (ref 0.1–1.0)
Monocytes Relative: 6 % (ref 3–12)
Neutro Abs: 3.6 10*3/uL (ref 1.7–7.7)

## 2011-09-08 LAB — URINALYSIS, ROUTINE W REFLEX MICROSCOPIC
Glucose, UA: NEGATIVE mg/dL
Hgb urine dipstick: NEGATIVE
Leukocytes, UA: NEGATIVE
Protein, ur: NEGATIVE mg/dL
pH: 5.5 (ref 5.0–8.0)

## 2011-09-08 MED ORDER — ONDANSETRON HCL 4 MG/2ML IJ SOLN
4.0000 mg | Freq: Once | INTRAMUSCULAR | Status: AC
  Administered 2011-09-08: 4 mg via INTRAVENOUS
  Filled 2011-09-08: qty 2

## 2011-09-08 MED ORDER — PANTOPRAZOLE SODIUM 40 MG IV SOLR
40.0000 mg | Freq: Once | INTRAVENOUS | Status: AC
  Administered 2011-09-08: 40 mg via INTRAVENOUS
  Filled 2011-09-08: qty 40

## 2011-09-08 MED ORDER — GI COCKTAIL ~~LOC~~
30.0000 mL | Freq: Once | ORAL | Status: AC
  Administered 2011-09-08: 30 mL via ORAL
  Filled 2011-09-08: qty 30

## 2011-09-08 MED ORDER — SODIUM CHLORIDE 0.9 % IV SOLN
Freq: Once | INTRAVENOUS | Status: AC
  Administered 2011-09-08: 14:00:00 via INTRAVENOUS

## 2011-09-08 NOTE — ED Notes (Signed)
Pt c/o generalized abd pain; pt c/o n/v/d

## 2011-09-08 NOTE — ED Provider Notes (Signed)
History     CSN: [id number] Arrival date & time: 09/08/2011 12:02 PM   First MD Initiated Contact with Patient 09/08/11 1326      Chief Complaint  Patient presents with  . Abdominal Pain    (Consider location/radiation/quality/duration/timing/severity/associated sxs/prior treatment) Patient is a 70 y.o. female presenting with abdominal pain. The history is provided by the patient (The patient complains of epigastric pain. She states she is not taking her proton performed a. Patient has also had some nausea).  Abdominal Pain The primary symptoms of the illness include abdominal pain and vomiting. The primary symptoms of the illness do not include fever, fatigue, shortness of breath or diarrhea. The current episode started 13 to 24 hours ago. The onset of the illness was gradual. The problem has not changed since onset. The abdominal pain does not radiate. The severity of the abdominal pain is 2/10. The abdominal pain is relieved by vomiting. The abdominal pain is exacerbated by belching.  The patient states that she believes she is currently not pregnant. The patient has not had a change in bowel habit. Symptoms associated with the illness do not include chills, anorexia, hematuria, frequency or back pain. Significant associated medical issues include PUD. Significant associated medical issues do not include liver disease or HIV.    Past Medical History  Diagnosis Date  . Hypertension   . Acid reflux     Past Surgical History  Procedure Date  . Total knee arthroplasty bilateral knee  . Hip  replacement left    History reviewed. No pertinent family history.  History  Substance Use Topics  . Smoking status: Current Some Day Smoker  . Smokeless tobacco: Not on file  . Alcohol Use: Yes     occasionally    OB History    Grav Para Term Preterm Abortions TAB SAB Ect Mult Living                  Review of Systems  Constitutional: Negative for fever, chills and fatigue.    HENT: Negative for congestion, sinus pressure and ear discharge.   Eyes: Negative for discharge.  Respiratory: Negative for cough and shortness of breath.   Cardiovascular: Negative for chest pain.  Gastrointestinal: Positive for vomiting and abdominal pain. Negative for diarrhea and anorexia.  Genitourinary: Negative for frequency and hematuria.  Musculoskeletal: Negative for back pain.  Skin: Negative for rash.  Neurological: Negative for seizures and headaches.  Hematological: Negative.   Psychiatric/Behavioral: Negative for hallucinations.    Allergies  Review of patient's allergies indicates no known allergies.  Home Medications   Current Outpatient Rx  Name Route Sig Dispense Refill  . ASPIRIN EC 81 MG PO TBEC Oral Take 81 mg by mouth daily.      Marland Kitchen DILTIAZEM HCL ER BEADS 360 MG PO CP24  TAKE ONE CAPSULE BY MOUTH DAILY 30 capsule 12    Generic WUJ:[id number]     360MG /24  . LOVASTATIN 40 MG PO TABS  TAKE 2 TABLET BY MOUTH ONCE DAILY AT BEDTIME 60 tablet 1    Generic NWG:NFAOZHY   40MG   . METOPROLOL SUCCINATE ER 50 MG PO TB24 Oral Take 50 mg by mouth daily.      Marland Kitchen PANTOPRAZOLE SODIUM 40 MG PO TBEC Oral Take 40 mg by mouth 2 (two) times daily.        Ht 5\' 6"  (1.676 m)  Wt 210 lb (95.255 kg)  BMI 33.89 kg/m2  Physical Exam  Constitutional: She is oriented to  person, place, and time. She appears well-developed.  HENT:  Head: Normocephalic and atraumatic.  Eyes: Conjunctivae and EOM are normal. No scleral icterus.  Neck: Neck supple. No thyromegaly present.  Cardiovascular: Normal rate and regular rhythm.  Exam reveals no gallop and no friction rub.   No murmur heard. Pulmonary/Chest: No stridor. She has no wheezes. She has no rales. She exhibits no tenderness.  Abdominal: She exhibits no distension. There is tenderness. There is no rebound.       Tender epigastric  Musculoskeletal: Normal range of motion. She exhibits no edema.  Lymphadenopathy:    She has no cervical  adenopathy.  Neurological: She is oriented to person, place, and time. Coordination normal.  Skin: No rash noted. No erythema.  Psychiatric: She has a normal mood and affect. Her behavior is normal.    ED Course  Procedures (including critical care time)  Labs Reviewed  URINALYSIS, ROUTINE W REFLEX MICROSCOPIC - Abnormal; Notable for the following:    APPearance HAZY (*)    All other components within normal limits  CBC - Abnormal; Notable for the following:    RBC 5.31 (*)    All other components within normal limits  COMPREHENSIVE METABOLIC PANEL - Abnormal; Notable for the following:    Glucose, Bld 102 (*)    GFR calc non Af Amer 72 (*)    GFR calc Af Amer 84 (*)    All other components within normal limits  DIFFERENTIAL  LIPASE, BLOOD  TROPONIN I   Dg Abd Acute W/chest  09/08/2011  *RADIOLOGY REPORT*  Clinical Data: Abdominal pain.  ACUTE ABDOMEN SERIES (ABDOMEN 2 VIEW & CHEST 1 VIEW)  Comparison: CT 05/23/2010  Findings: Prior cholecystectomy and left hip replacement. Calcifications in the right lower abdomen were shown on prior CT to represent phleboliths within the gonadal vein. The bowel gas pattern is normal.  There is no evidence of free intraperitoneal air.  No suspicious radio-opaque calculi or other significant radiographic abnormality is seen. Heart size and mediastinal contours are within normal limits.  Both lungs are clear.  IMPRESSION: No acute findings.  Original Report Authenticated By: Cyndie Chime, M.D.     1. Abdominal pain    Date: 09/08/2011  Rate: 59  Rhythm: sinus bradycardia  QRS Axis: normal  Intervals: normal  ST/T Wave abnormalities: nonspecific ST changes  Conduction Disutrbances:none  Narrative Interpretation:   Old EKG Reviewed: unchanged      MDM  Pud,  Pt with pain and nauseau after not taking protonix for 4 days        Benny Lennert, MD 09/08/11 1553

## 2011-11-01 ENCOUNTER — Other Ambulatory Visit: Payer: Self-pay | Admitting: Cardiology

## 2011-11-06 ENCOUNTER — Encounter: Payer: Self-pay | Admitting: Cardiology

## 2011-11-06 ENCOUNTER — Ambulatory Visit (INDEPENDENT_AMBULATORY_CARE_PROVIDER_SITE_OTHER): Payer: 59 | Admitting: Cardiology

## 2011-11-06 VITALS — BP 137/81 | HR 76 | Resp 18 | Ht 66.0 in | Wt 216.0 lb

## 2011-11-06 DIAGNOSIS — E785 Hyperlipidemia, unspecified: Secondary | ICD-10-CM

## 2011-11-06 DIAGNOSIS — I471 Supraventricular tachycardia: Secondary | ICD-10-CM

## 2011-11-06 DIAGNOSIS — F17200 Nicotine dependence, unspecified, uncomplicated: Secondary | ICD-10-CM

## 2011-11-06 DIAGNOSIS — E663 Overweight: Secondary | ICD-10-CM

## 2011-11-06 NOTE — Patient Instructions (Signed)
Your physician recommends that you schedule a follow-up appointment in: 2 years  

## 2011-11-06 NOTE — Progress Notes (Signed)
HPI [name] comes in today for evaluation and history paroxysmal SVT. She has had no sustained clinical recurrence.    She is under a lot of stress with her husband's illness. He tends to smoke too much at times.  She denies any angina or TIA symptoms. She seems to be compliant with her medications.  Past Medical History  Diagnosis Date  . Hypertension   . Acid reflux     Current Outpatient Prescriptions  Medication Sig Dispense Refill  . aspirin EC 81 MG tablet Take 81 mg by mouth daily.        Marland Kitchen diltiazem (TIAZAC) 360 MG 24 hr capsule TAKE ONE CAPSULE BY MOUTH DAILY  30 capsule  12  . lovastatin (MEVACOR) 40 MG tablet TAKE 2 TABLET BY MOUTH ONCE DAILY AT BEDTIME  60 tablet  12  . metoprolol (TOPROL-XL) 50 MG 24 hr tablet Take 50 mg by mouth daily.        . pantoprazole (PROTONIX) 40 MG tablet Take 40 mg by mouth 2 (two) times daily.          No Known Allergies  No family history on file.  History   Social History  . Marital Status: Married    Spouse Name: N/A    Number of Children: N/A  . Years of Education: N/A   Occupational History  . Not on file.   Social History Main Topics  . Smoking status: Current Some Day Smoker  . Smokeless tobacco: Not on file  . Alcohol Use: Yes     occasionally  . Drug Use: No  . Sexually Active:    Other Topics Concern  . Not on file   Social History Narrative  . No narrative on file    ROS ALL NEGATIVE EXCEPT THOSE NOTED IN HPI  PE  General Appearance: well developed, well nourished in no acute distress, obese HEENT: symmetrical face, PERRLA, good dentition  Neck: no JVD, thyromegaly, or adenopathy, trachea midline Chest: symmetric without deformity Cardiac: PMI non-displaced, RRR, normal S1, S2, no gallop or murmur Lung: clear to ausculation and percussion Vascular: all pulses full without bruits  Abdominal: nondistended, nontender, good bowel sounds, no HSM, no bruits Extremities: no cyanosis, clubbing or edema, no  sign of DVT, no varicosities  Skin: normal color, no rashes Neuro: alert and oriented x 3, non-focal Pysch: normal affect  EKG  BMET    Component Value Date/Time   NA 138 09/08/2011 1332   K 3.8 09/08/2011 1332   CL 100 09/08/2011 1332   CO2 28 09/08/2011 1332   GLUCOSE 102* 09/08/2011 1332   BUN 12 09/08/2011 1332   CREATININE 0.80 09/08/2011 1332   CALCIUM 10.4 09/08/2011 1332   GFRNONAA 72* 09/08/2011 1332   GFRAA 84* 09/08/2011 1332    Lipid Panel     Component Value Date/Time   CHOL 193 09/17/2010 2136   TRIG 131 09/17/2010 2136   HDL 51 09/17/2010 2136   CHOLHDL 3.8 Ratio 09/17/2010 2136   VLDL 26 09/17/2010 2136   LDLCALC 116* 09/17/2010 2136    CBC    Component Value Date/Time   WBC 6.9 09/08/2011 1332   RBC 5.31* 09/08/2011 1332   HGB 14.8 09/08/2011 1332   HCT 45.0 09/08/2011 1332   PLT 340 09/08/2011 1332   MCV 84.7 09/08/2011 1332   MCH 27.9 09/08/2011 1332   MCHC 32.9 09/08/2011 1332   RDW 14.0 09/08/2011 1332   LYMPHSABS 2.7 09/08/2011 1332   MONOABS 0.4 09/08/2011  1332   EOSABS 0.1 09/08/2011 1332   BASOSABS 0.0 09/08/2011 1332

## 2011-11-06 NOTE — Assessment & Plan Note (Signed)
Stable with no clinical recurrence. No change in treatment.

## 2011-11-06 NOTE — Assessment & Plan Note (Signed)
Unchanged. Encouraged to quit.

## 2012-04-30 ENCOUNTER — Other Ambulatory Visit: Payer: Self-pay | Admitting: Cardiology

## 2012-05-01 ENCOUNTER — Other Ambulatory Visit: Payer: Self-pay

## 2012-05-01 MED ORDER — DILTIAZEM HCL ER BEADS 360 MG PO CP24: 360.0000 mg | ORAL_CAPSULE | Freq: Every day | ORAL | Status: DC

## 2012-05-12 ENCOUNTER — Encounter (HOSPITAL_COMMUNITY): Payer: Self-pay | Admitting: *Deleted

## 2012-05-12 ENCOUNTER — Emergency Department (HOSPITAL_COMMUNITY)
Admission: EM | Admit: 2012-05-12 | Discharge: 2012-05-12 | Disposition: A | Discharge status: Home / Self Care | Payer: Medicare Other | Attending: Emergency Medicine | Admitting: Emergency Medicine

## 2012-05-12 DIAGNOSIS — F17200 Nicotine dependence, unspecified, uncomplicated: Secondary | ICD-10-CM | POA: Insufficient documentation

## 2012-05-12 DIAGNOSIS — K219 Gastro-esophageal reflux disease without esophagitis: Secondary | ICD-10-CM | POA: Insufficient documentation

## 2012-05-12 DIAGNOSIS — I10 Essential (primary) hypertension: Secondary | ICD-10-CM | POA: Insufficient documentation

## 2012-05-12 DIAGNOSIS — H109 Unspecified conjunctivitis: Secondary | ICD-10-CM | POA: Insufficient documentation

## 2012-05-12 MED ORDER — TOBRAMYCIN 0.3 % OP SOLN
2.0000 [drp] | OPHTHALMIC | Status: DC
  Administered 2012-05-12: 2 [drp] via OPHTHALMIC
  Filled 2012-05-12: qty 5

## 2012-05-12 NOTE — ED Notes (Signed)
Bilateral eye drainage that started today, pt states that both eyes area sore.

## 2012-05-12 NOTE — ED Provider Notes (Cosign Needed)
History     CSN: [id number]  Arrival date & time 05/12/12  4696   First MD Initiated Contact with Patient 05/12/12 1834      Chief Complaint  Patient presents with  . Eye Drainage    (Consider location/radiation/quality/duration/timing/severity/associated sxs/prior treatment) HPI  Patient relates today she started having some draining of thick material from her eyes. She states she's had mild itching and soreness of her eyes. She denies having this problem before. She denies being around any small children.  PCP Dr. Mirna Mires Cardiologist Dr. Daleen Squibb GI Dr. Darrick Penna  Past Medical History  Diagnosis Date  . Hypertension   . Acid reflux     Past Surgical History  Procedure Date  . Total knee arthroplasty bilateral knee  . Hip  replacement left    No family history on file.  History  Substance Use Topics  . Smoking status: Current Some Day Smoker  . Smokeless tobacco: Not on file  . Alcohol Use: Yes     occasionally  lives at home Lives with spouse  OB History    Grav Para Term Preterm Abortions TAB SAB Ect Mult Living                  Review of Systems  All other systems reviewed and are negative.    Allergies  Review of patient's allergies indicates no known allergies.  Home Medications   Current Outpatient Rx  Name Route Sig Dispense Refill  . ASPIRIN EC 81 MG PO TBEC Oral Take 81 mg by mouth every morning.     Marland Kitchen CHLORDIAZEPOXIDE HCL 10 MG PO CAPS Oral Take 10 mg by mouth at bedtime. For 15 days    . DILTIAZEM HCL ER BEADS 360 MG PO CP24 Oral Take 360 mg by mouth every morning.     Marland Kitchen LOVASTATIN 40 MG PO TABS  TAKE 2 TABLET BY MOUTH ONCE DAILY AT BEDTIME 60 tablet 12    Generic EXB:[id number]   40MG   . METOPROLOL SUCCINATE ER 50 MG PO TB24 Oral Take 50 mg by mouth every morning.     Marland Kitchen PANTOPRAZOLE SODIUM 40 MG PO TBEC Oral Take 40 mg by mouth 2 (two) times daily.        BP 155/72  Pulse 62  Temp 98.4 F (36.9 C) (Oral)  Resp 18  Ht 5\' 6"  (1.676 m)   Wt 209 lb (94.802 kg)  BMI 33.73 kg/m2  SpO2 97%  Vital signs normal    Physical Exam  Constitutional: She is oriented to person, place, and time. She appears well-developed and well-nourished.  Non-toxic appearance. She does not appear ill. No distress.  HENT:  Head: Normocephalic and atraumatic.  Right Ear: External ear normal.  Left Ear: External ear normal.  Nose: Nose normal. No mucosal edema or rhinorrhea.  Mouth/Throat: Oropharynx is clear and moist and mucous membranes are normal. No dental abscesses or uvula swelling.  Eyes: EOM are normal. Pupils are equal, round, and reactive to light. Right eye exhibits discharge and exudate. Left eye exhibits discharge and exudate. Right conjunctiva is injected. Left conjunctiva is injected.  Neck: Normal range of motion and full passive range of motion without pain. Neck supple.  Pulmonary/Chest: Effort normal. No respiratory distress. She has no rhonchi. She exhibits no crepitus.  Abdominal: Normal appearance.  Musculoskeletal: Normal range of motion.       Moves all extremities well.   Neurological: She is alert and oriented to person, place, and time. She  has normal strength. No cranial nerve deficit.  Skin: Skin is warm, dry and intact. No rash noted. No erythema. No pallor.  Psychiatric: She has a normal mood and affect. Her speech is normal and behavior is normal. Her mood appears not anxious.    ED Course  Procedures (including critical care time)   Medications  tobramycin (TOBREX) 0.3 % ophthalmic solution 2 drop (not administered)     1. Conjunctivitis, both eyes     Plan discharge  Devoria Albe, MD, FACEP   MDM          Ward Givens, MD 05/12/12 [phone]

## 2012-07-31 ENCOUNTER — Ambulatory Visit (HOSPITAL_COMMUNITY)
Admission: RE | Admit: 2012-07-31 | Discharge: 2012-07-31 | Disposition: A | Discharge status: Home / Self Care | Payer: Medicare Other | Source: Ambulatory Visit | Attending: Family Medicine | Admitting: Family Medicine

## 2012-07-31 ENCOUNTER — Other Ambulatory Visit (HOSPITAL_COMMUNITY): Payer: Self-pay | Admitting: Family Medicine

## 2012-07-31 DIAGNOSIS — R109 Unspecified abdominal pain: Secondary | ICD-10-CM

## 2012-07-31 DIAGNOSIS — R1900 Intra-abdominal and pelvic swelling, mass and lump, unspecified site: Secondary | ICD-10-CM

## 2012-09-28 ENCOUNTER — Other Ambulatory Visit: Payer: Self-pay

## 2012-09-28 ENCOUNTER — Emergency Department (HOSPITAL_COMMUNITY): Payer: Medicare Other

## 2012-09-28 ENCOUNTER — Encounter (HOSPITAL_COMMUNITY): Payer: Self-pay | Admitting: Emergency Medicine

## 2012-09-28 ENCOUNTER — Emergency Department (HOSPITAL_COMMUNITY)
Admission: EM | Admit: 2012-09-28 | Discharge: 2012-09-28 | Disposition: A | Discharge status: Home / Self Care | Payer: Medicare Other | Attending: Emergency Medicine | Admitting: Emergency Medicine

## 2012-09-28 DIAGNOSIS — I10 Essential (primary) hypertension: Secondary | ICD-10-CM | POA: Insufficient documentation

## 2012-09-28 DIAGNOSIS — Z8639 Personal history of other endocrine, nutritional and metabolic disease: Secondary | ICD-10-CM | POA: Insufficient documentation

## 2012-09-28 DIAGNOSIS — M129 Arthropathy, unspecified: Secondary | ICD-10-CM | POA: Insufficient documentation

## 2012-09-28 DIAGNOSIS — R197 Diarrhea, unspecified: Secondary | ICD-10-CM

## 2012-09-28 DIAGNOSIS — Z8719 Personal history of other diseases of the digestive system: Secondary | ICD-10-CM | POA: Insufficient documentation

## 2012-09-28 DIAGNOSIS — Z86718 Personal history of other venous thrombosis and embolism: Secondary | ICD-10-CM | POA: Insufficient documentation

## 2012-09-28 DIAGNOSIS — Z8619 Personal history of other infectious and parasitic diseases: Secondary | ICD-10-CM | POA: Insufficient documentation

## 2012-09-28 DIAGNOSIS — Z7982 Long term (current) use of aspirin: Secondary | ICD-10-CM | POA: Insufficient documentation

## 2012-09-28 DIAGNOSIS — K219 Gastro-esophageal reflux disease without esophagitis: Secondary | ICD-10-CM | POA: Insufficient documentation

## 2012-09-28 DIAGNOSIS — R112 Nausea with vomiting, unspecified: Secondary | ICD-10-CM | POA: Insufficient documentation

## 2012-09-28 DIAGNOSIS — Z8711 Personal history of peptic ulcer disease: Secondary | ICD-10-CM | POA: Insufficient documentation

## 2012-09-28 DIAGNOSIS — Z862 Personal history of diseases of the blood and blood-forming organs and certain disorders involving the immune mechanism: Secondary | ICD-10-CM | POA: Insufficient documentation

## 2012-09-28 DIAGNOSIS — F17200 Nicotine dependence, unspecified, uncomplicated: Secondary | ICD-10-CM | POA: Insufficient documentation

## 2012-09-28 DIAGNOSIS — E7800 Pure hypercholesterolemia, unspecified: Secondary | ICD-10-CM | POA: Insufficient documentation

## 2012-09-28 DIAGNOSIS — Z79899 Other long term (current) drug therapy: Secondary | ICD-10-CM | POA: Insufficient documentation

## 2012-09-28 LAB — LIPASE, BLOOD: Lipase: 23 U/L (ref 11–59)

## 2012-09-28 LAB — COMPREHENSIVE METABOLIC PANEL
CO2: 27 mEq/L (ref 19–32)
Calcium: 10.3 mg/dL (ref 8.4–10.5)
Creatinine, Ser: 1.04 mg/dL (ref 0.50–1.10)
GFR calc Af Amer: 61 mL/min — ABNORMAL LOW (ref >90)
GFR calc non Af Amer: 52 mL/min — ABNORMAL LOW (ref >90)
Glucose, Bld: 129 mg/dL — ABNORMAL HIGH (ref 70–99)
Total Protein: 7.6 g/dL (ref 6.0–8.3)

## 2012-09-28 LAB — CBC
Hemoglobin: 14.8 g/dL (ref 12.0–15.0)
MCH: 28.1 pg (ref 26.0–34.0)
MCHC: 34 g/dL (ref 30.0–36.0)
MCV: 82.5 fL (ref 78.0–100.0)
RBC: 5.27 MIL/uL — ABNORMAL HIGH (ref 3.87–5.11)

## 2012-09-28 LAB — TROPONIN I: Troponin I: <0.3 ng/mL (ref <0.30)

## 2012-09-28 MED ORDER — ONDANSETRON 8 MG PO TBDP: 8.0000 mg | ORAL_TABLET | Freq: Three times a day (TID) | ORAL | Status: DC | PRN

## 2012-09-28 MED ORDER — SODIUM CHLORIDE 0.9 % IV SOLN: 1000.0000 mL | INTRAVENOUS | Status: DC

## 2012-09-28 MED ORDER — HYDROCODONE-ACETAMINOPHEN 5-325 MG PO TABS: 1.0000 | ORAL_TABLET | ORAL | Status: DC | PRN

## 2012-09-28 MED ORDER — MORPHINE SULFATE 4 MG/ML IJ SOLN
4.0000 mg | Freq: Once | INTRAMUSCULAR | Status: AC
  Administered 2012-09-28: 4 mg via INTRAVENOUS
  Filled 2012-09-28: qty 1

## 2012-09-28 MED ORDER — ONDANSETRON HCL 4 MG/2ML IJ SOLN
4.0000 mg | Freq: Once | INTRAMUSCULAR | Status: AC
  Administered 2012-09-28: 4 mg via INTRAVENOUS
  Filled 2012-09-28: qty 2

## 2012-09-28 MED ORDER — SODIUM CHLORIDE 0.9 % IV SOLN
1000.0000 mL | Freq: Once | INTRAVENOUS | Status: AC
  Administered 2012-09-28: 1000 mL via INTRAVENOUS

## 2012-09-28 NOTE — ED Notes (Signed)
Patient with no complaints at this time. Respirations even and unlabored. Skin warm/dry. Discharge instructions reviewed with patient at this time. Patient given opportunity to voice concerns/ask questions. IV removed per policy and band-aid applied to site. Patient discharged at this time and left Emergency Department with steady gait.  

## 2012-09-28 NOTE — ED Provider Notes (Signed)
History     CSN: [id number]  Arrival date & time 09/28/12  0507   First MD Initiated Contact with Patient 09/28/12 [phone]      Chief Complaint  Patient presents with  . Chest Pain     The history is provided by the patient.  the patient has been having nausea vomiting and diarrhea over the past 72 hours.  She reports approximately 2 days ago she began having pain that's been constant in her anterior chest since then.  This is worse when it's pushed on and when she vomits.  No hematemesis.  No melena or hematochezia.  No coffee ground emesis described.  She has no abdominal pain.  Her chest is worse when I push on it.  No shortness of breath.  No fevers or chills.  No urinary symptoms.  No recent sick contacts.  Her symptoms are mild to moderate in severity  Past Medical History  Diagnosis Date  . Hypertension   . Acid reflux     Past Surgical History  Procedure Date  . Total knee arthroplasty bilateral knee  . Hip  replacement left    History reviewed. No pertinent family history.  History  Substance Use Topics  . Smoking status: Current Some Day Smoker  . Smokeless tobacco: Not on file  . Alcohol Use: Yes     Comment: occasionally    OB History    Grav Para Term Preterm Abortions TAB SAB Ect Mult Living                  Review of Systems  All other systems reviewed and are negative.    Allergies  Review of patient's allergies indicates no known allergies.  Home Medications   Current Outpatient Rx  Name  Route  Sig  Dispense  Refill  . ASPIRIN EC 81 MG PO TBEC   Oral   Take 81 mg by mouth every morning.          Marland Kitchen CHLORDIAZEPOXIDE HCL 10 MG PO CAPS   Oral   Take 10 mg by mouth at bedtime. For 15 days         . DILTIAZEM HCL ER BEADS 360 MG PO CP24   Oral   Take 360 mg by mouth every morning.          Marland Kitchen LOVASTATIN 40 MG PO TABS      TAKE 2 TABLET BY MOUTH ONCE DAILY AT BEDTIME   60 tablet   12     Generic JXB:[id number]   40MG    .  METOPROLOL SUCCINATE ER 50 MG PO TB24   Oral   Take 50 mg by mouth every morning.          Marland Kitchen PANTOPRAZOLE SODIUM 40 MG PO TBEC   Oral   Take 40 mg by mouth 2 (two) times daily.             BP 133/79  Pulse 74  Temp 98.7 F (37.1 C) (Oral)  Resp 18  Ht 5\' 6"  (1.676 m)  Wt 195 lb (88.451 kg)  BMI 31.47 kg/m2  SpO2 98%  Physical Exam  Nursing note and vitals reviewed. Constitutional: She is oriented to person, place, and time. She appears well-developed and well-nourished. No distress.  HENT:  Head: Normocephalic and atraumatic.  Eyes: EOM are normal.  Neck: Normal range of motion.  Cardiovascular: Normal rate, regular rhythm and normal heart sounds.   Pulmonary/Chest: Effort normal and breath sounds normal.  Tenderness to anterior chest wall without crepitus  Abdominal: Soft. She exhibits no distension. There is no tenderness. There is no rebound and no guarding.  Musculoskeletal: Normal range of motion.  Neurological: She is alert and oriented to person, place, and time.  Skin: Skin is warm and dry.  Psychiatric: She has a normal mood and affect. Judgment normal.    ED Course  Procedures (including critical care time)   Date: 09/28/2012  Rate: 84  Rhythm: normal sinus rhythm  QRS Axis: normal  Intervals: normal  ST/T Wave abnormalities: normal  Conduction Disutrbances: none  Narrative Interpretation:   Old EKG Reviewed: No significant changes noted     Labs Reviewed  CBC - Abnormal; Notable for the following:    RBC 5.27 (*)     All other components within normal limits  COMPREHENSIVE METABOLIC PANEL - Abnormal; Notable for the following:    Sodium 134 (*)     Glucose, Bld 129 (*)     GFR calc non Af Amer 52 (*)     GFR calc Af Amer 61 (*)     All other components within normal limits  TROPONIN I  LIPASE, BLOOD   Dg Chest 2 View  09/28/2012  *RADIOLOGY REPORT*  Clinical Data: Vomiting for 2 days; chest pain.  Right arm tingling.  CHEST - 2  VIEW  Comparison: Chest radiograph performed 11/22/2007  Findings: The lungs are well-aerated and clear.  There is no evidence of focal opacification, pleural effusion or pneumothorax.  The heart is normal in size; the mediastinal contour is within normal limits.  No acute osseous abnormalities are seen.  Mild degenerative change is noted along the thoracic spine.  IMPRESSION: No acute cardiopulmonary process seen.   Original Report Authenticated By: Tonia Ghent, M.D.    I personally reviewed the imaging tests through PACS system I reviewed available ER/hospitalization records through the EMR    1. Nausea vomiting and diarrhea       MDM  This is a musculoskeletal chest wall pain secondary to probably vomiting.  Her nausea vomiting and diarrhea sounds more viral in nature.  Her abdomen is benign on exam.  Her vital signs are without significant abnormality.  Thus far her labs look normal.  EKG is unchanged.  Chest x-ray is clear.  The patient be treated symptomatically with pain medicine and nausea medicine in the ER.  I will gently hydrate the patient in the ER.  6:51 AM The patient feels much better at this time.  Discharge home in good condition.  Home with pain medicine and nausea medicine.  Instructions to keep herself hydrated at home.  She understands to return to the emergency department for new or worsening symptoms  Lyanne Co, MD 09/28/12 [phone]

## 2012-09-28 NOTE — ED Notes (Signed)
Patient states she was vomiting 2 days ago and started having chest pain at same time. States pain is in right side of chest with tingling in right arm. States last vomiting episode was yesterday.

## 2012-09-30 ENCOUNTER — Encounter (HOSPITAL_COMMUNITY): Payer: Self-pay | Admitting: *Deleted

## 2012-09-30 ENCOUNTER — Emergency Department (HOSPITAL_COMMUNITY): Payer: Medicare Other

## 2012-09-30 ENCOUNTER — Emergency Department (HOSPITAL_COMMUNITY)
Admission: EM | Admit: 2012-09-30 | Discharge: 2012-09-30 | Disposition: A | Discharge status: Home / Self Care | Payer: Medicare Other | Attending: Emergency Medicine | Admitting: Emergency Medicine

## 2012-09-30 DIAGNOSIS — Z8711 Personal history of peptic ulcer disease: Secondary | ICD-10-CM | POA: Insufficient documentation

## 2012-09-30 DIAGNOSIS — Z862 Personal history of diseases of the blood and blood-forming organs and certain disorders involving the immune mechanism: Secondary | ICD-10-CM | POA: Insufficient documentation

## 2012-09-30 DIAGNOSIS — Z86718 Personal history of other venous thrombosis and embolism: Secondary | ICD-10-CM | POA: Insufficient documentation

## 2012-09-30 DIAGNOSIS — R1032 Left lower quadrant pain: Secondary | ICD-10-CM | POA: Insufficient documentation

## 2012-09-30 DIAGNOSIS — R197 Diarrhea, unspecified: Secondary | ICD-10-CM | POA: Insufficient documentation

## 2012-09-30 DIAGNOSIS — I10 Essential (primary) hypertension: Secondary | ICD-10-CM | POA: Insufficient documentation

## 2012-09-30 DIAGNOSIS — Z79899 Other long term (current) drug therapy: Secondary | ICD-10-CM | POA: Insufficient documentation

## 2012-09-30 DIAGNOSIS — R112 Nausea with vomiting, unspecified: Secondary | ICD-10-CM | POA: Insufficient documentation

## 2012-09-30 DIAGNOSIS — Z8739 Personal history of other diseases of the musculoskeletal system and connective tissue: Secondary | ICD-10-CM | POA: Insufficient documentation

## 2012-09-30 DIAGNOSIS — Z9089 Acquired absence of other organs: Secondary | ICD-10-CM | POA: Insufficient documentation

## 2012-09-30 DIAGNOSIS — R079 Chest pain, unspecified: Secondary | ICD-10-CM | POA: Insufficient documentation

## 2012-09-30 DIAGNOSIS — R110 Nausea: Secondary | ICD-10-CM

## 2012-09-30 DIAGNOSIS — Z90710 Acquired absence of both cervix and uterus: Secondary | ICD-10-CM | POA: Insufficient documentation

## 2012-09-30 DIAGNOSIS — F17200 Nicotine dependence, unspecified, uncomplicated: Secondary | ICD-10-CM | POA: Insufficient documentation

## 2012-09-30 DIAGNOSIS — Z9861 Coronary angioplasty status: Secondary | ICD-10-CM | POA: Insufficient documentation

## 2012-09-30 DIAGNOSIS — R109 Unspecified abdominal pain: Secondary | ICD-10-CM

## 2012-09-30 DIAGNOSIS — E7800 Pure hypercholesterolemia, unspecified: Secondary | ICD-10-CM | POA: Insufficient documentation

## 2012-09-30 DIAGNOSIS — Z8639 Personal history of other endocrine, nutritional and metabolic disease: Secondary | ICD-10-CM | POA: Insufficient documentation

## 2012-09-30 DIAGNOSIS — Z8719 Personal history of other diseases of the digestive system: Secondary | ICD-10-CM | POA: Insufficient documentation

## 2012-09-30 DIAGNOSIS — Z7982 Long term (current) use of aspirin: Secondary | ICD-10-CM | POA: Insufficient documentation

## 2012-09-30 DIAGNOSIS — Z8679 Personal history of other diseases of the circulatory system: Secondary | ICD-10-CM | POA: Insufficient documentation

## 2012-09-30 DIAGNOSIS — Z8619 Personal history of other infectious and parasitic diseases: Secondary | ICD-10-CM | POA: Insufficient documentation

## 2012-09-30 HISTORY — DX: Unspecified abdominal pain: R10.9

## 2012-09-30 HISTORY — DX: Supraventricular tachycardia, unspecified: I47.10

## 2012-09-30 HISTORY — DX: Benign neoplasm of unspecified adrenal gland: D35.00

## 2012-09-30 HISTORY — DX: Diarrhea, unspecified: R19.7

## 2012-09-30 HISTORY — DX: Nontoxic single thyroid nodule: E04.1

## 2012-09-30 HISTORY — DX: Acute embolism and thrombosis of unspecified deep veins of unspecified lower extremity: I82.409

## 2012-09-30 HISTORY — DX: Supraventricular tachycardia: I47.1

## 2012-09-30 HISTORY — DX: Other specified bacterial intestinal infections: A04.8

## 2012-09-30 HISTORY — DX: Peptic ulcer, site unspecified, unspecified as acute or chronic, without hemorrhage or perforation: K27.9

## 2012-09-30 HISTORY — DX: Gastro-esophageal reflux disease without esophagitis: K21.9

## 2012-09-30 HISTORY — DX: Pure hypercholesterolemia, unspecified: E78.00

## 2012-09-30 HISTORY — DX: Diaphragmatic hernia without obstruction or gangrene: K44.9

## 2012-09-30 HISTORY — DX: Diverticulosis of intestine, part unspecified, without perforation or abscess without bleeding: K57.90

## 2012-09-30 HISTORY — DX: Unspecified osteoarthritis, unspecified site: M19.90

## 2012-09-30 LAB — COMPREHENSIVE METABOLIC PANEL
ALT: 21 U/L (ref 0–35)
AST: 15 U/L (ref 0–37)
Albumin: 3.6 g/dL (ref 3.5–5.2)
Alkaline Phosphatase: 66 U/L (ref 39–117)
Chloride: 99 mEq/L (ref 96–112)
Creatinine, Ser: 0.76 mg/dL (ref 0.50–1.10)
Potassium: 3.5 mEq/L (ref 3.5–5.1)
Sodium: 136 mEq/L (ref 135–145)
Total Bilirubin: 0.4 mg/dL (ref 0.3–1.2)

## 2012-09-30 LAB — URINALYSIS, ROUTINE W REFLEX MICROSCOPIC
Bilirubin Urine: NEGATIVE
Glucose, UA: NEGATIVE mg/dL
Hgb urine dipstick: NEGATIVE
Protein, ur: NEGATIVE mg/dL
Urobilinogen, UA: 0.2 mg/dL (ref 0.0–1.0)

## 2012-09-30 LAB — CBC
Platelets: 323 10*3/uL (ref 150–400)
RDW: 13.7 % (ref 11.5–15.5)
WBC: 7.1 10*3/uL (ref 4.0–10.5)

## 2012-09-30 LAB — LIPASE, BLOOD: Lipase: 24 U/L (ref 11–59)

## 2012-09-30 LAB — TROPONIN I: Troponin I: <0.3 ng/mL (ref <0.30)

## 2012-09-30 MED ORDER — ONDANSETRON HCL 4 MG/2ML IJ SOLN
4.0000 mg | INTRAMUSCULAR | Status: DC | PRN
  Administered 2012-09-30: 4 mg via INTRAVENOUS
  Filled 2012-09-30: qty 2

## 2012-09-30 MED ORDER — MORPHINE SULFATE 4 MG/ML IJ SOLN
4.0000 mg | INTRAMUSCULAR | Status: DC | PRN
  Administered 2012-09-30: 4 mg via INTRAVENOUS
  Filled 2012-09-30: qty 1

## 2012-09-30 MED ORDER — FAMOTIDINE IN NACL 20-0.9 MG/50ML-% IV SOLN
20.0000 mg | Freq: Once | INTRAVENOUS | Status: AC
  Administered 2012-09-30: 20 mg via INTRAVENOUS
  Filled 2012-09-30: qty 50

## 2012-09-30 MED ORDER — IOHEXOL 300 MG/ML  SOLN
100.0000 mL | Freq: Once | INTRAMUSCULAR | Status: AC | PRN
  Administered 2012-09-30: 100 mL via INTRAVENOUS

## 2012-09-30 MED ORDER — ONDANSETRON HCL 4 MG PO TABS: 4.0000 mg | ORAL_TABLET | Freq: Three times a day (TID) | ORAL | Status: DC | PRN

## 2012-09-30 MED ORDER — DICYCLOMINE HCL 10 MG PO CAPS
20.0000 mg | ORAL_CAPSULE | Freq: Once | ORAL | Status: AC
  Administered 2012-09-30: 20 mg via ORAL
  Filled 2012-09-30: qty 2

## 2012-09-30 MED ORDER — DICYCLOMINE HCL 20 MG PO TABS: 20.0000 mg | ORAL_TABLET | Freq: Four times a day (QID) | ORAL | Status: DC | PRN

## 2012-09-30 MED ORDER — SODIUM CHLORIDE 0.9 % IV SOLN
INTRAVENOUS | Status: DC
  Administered 2012-09-30: 1000 mL via INTRAVENOUS

## 2012-09-30 NOTE — ED Provider Notes (Signed)
History     CSN: [id number]  Arrival date & time 09/30/12  1211   First MD Initiated Contact with Patient 09/30/12 1308      Chief Complaint  Patient presents with  . Abdominal Pain  . Chest Pain    HPI Pt was seen at 1325.   Per pt, c/o gradual onset and persistence of constant abd "pain" for the past 5 days.  Describes the pain as "cramping."  Has been associated with multiple intermittent episodes of N/V/D as well as constant anterior chest "pain" for the past 4-5 days. States the CP began after vomiting. Pt was eval in the ED 2 days ago for same, rx vicodin and zofran with relief of vomiting, but states her CP, abd pain and diarrhea symptoms otherwise continue.  Denies fevers, no back pain, no palpitations, no SOB/cough, no rash, no black or blood in stools or emesis.    GI: Dr. Darrick Penna Past Medical History  Diagnosis Date  . Hypertension   . Paroxysmal SVT (supraventricular tachycardia)   . Hypercholesteremia   . GERD (gastroesophageal reflux disease)   . PUD (peptic ulcer disease)   . DVT (deep venous thrombosis) 2007  . Adrenal adenoma     left  . Thyroid nodule     left  . H. pylori infection   . Diverticulosis   . 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 Diarrhea     recurrent  . Arthritis   . Hiatal hernia     Past Surgical History  Procedure Date  . Total knee arthroplasty bilateral knee  . Hip  replacement left  . Appendectomy   . Cholecystectomy   . Abdominal hysterectomy   . Breast lumpectomy     left  . Ablation     of SVT  . Cardiac catheterization 2009    normal coronary arteries      History  Substance Use Topics  . Smoking status: Current Some Day Smoker  . Smokeless tobacco: Not on file  . Alcohol Use: Yes     Comment: occasionally    Review of Systems ROS: Statement: All systems negative except as marked or noted in the HPI; Constitutional: Negative for fever and chills. ; ; Eyes: Negative for eye  pain, redness and discharge. ; ; ENMT: Negative for ear pain, hoarseness, nasal congestion, sinus pressure and sore throat. ; ; Cardiovascular: +CP. Negative for palpitations, diaphoresis, dyspnea and peripheral edema. ; ; Respiratory: Negative for cough, wheezing and stridor. ; ; Gastrointestinal: +N/V/D, abd pain. Negative for blood in stool, hematemesis, jaundice and rectal bleeding. . ; ; Genitourinary: Negative for dysuria, flank pain and hematuria. ; ; Musculoskeletal: Negative for back pain and neck pain. Negative for swelling and trauma.; ; Skin: Negative for pruritus, rash, abrasions, blisters, bruising and skin lesion.; ; Neuro: Negative for headache, lightheadedness and neck stiffness. Negative for weakness, altered level of consciousness , altered mental status, extremity weakness, paresthesias, involuntary movement, seizure and syncope.       Allergies  Review of patient's allergies indicates no known allergies.  Home Medications   Current Outpatient Rx  Name  Route  Sig  Dispense  Refill  . ASPIRIN EC 81 MG PO TBEC   Oral   Take 81 mg by mouth every morning.          Marland Kitchen CHLORDIAZEPOXIDE HCL 10 MG PO CAPS   Oral   Take 10 mg by mouth at bedtime. For 15  days         . DILTIAZEM HCL ER BEADS 360 MG PO CP24   Oral   Take 360 mg by mouth every morning.          Marland Kitchen HYDROCODONE-ACETAMINOPHEN 5-325 MG PO TABS   Oral   Take 1 tablet by mouth every 4 (four) hours as needed for pain.   8 tablet   0   . LOVASTATIN 40 MG PO TABS      TAKE 2 TABLET BY MOUTH ONCE DAILY AT BEDTIME   60 tablet   12     Generic AVW:[id number]   40MG    . METOPROLOL SUCCINATE ER 50 MG PO TB24   Oral   Take 50 mg by mouth every morning.          Marland Kitchen PANTOPRAZOLE SODIUM 40 MG PO TBEC   Oral   Take 40 mg by mouth 2 (two) times daily.           Marland Kitchen DICYCLOMINE HCL 20 MG PO TABS   Oral   Take 1 tablet (20 mg total) by mouth every 6 (six) hours as needed (abdominal cramping).   15 tablet   0    . ONDANSETRON HCL 4 MG PO TABS   Oral   Take 1 tablet (4 mg total) by mouth every 8 (eight) hours as needed for nausea.   6 tablet   0     BP 125/66  Pulse 60  Temp 98.2 F (36.8 C) (Oral)  Resp 16  Ht 5\' 6"  (1.676 m)  Wt 195 lb (88.451 kg)  BMI 31.47 kg/m2  SpO2 100%  Physical Exam 1330: Physical examination:  Nursing notes reviewed; Vital signs and O2 SAT reviewed;  Constitutional: Well developed, Well nourished, Well hydrated, In no acute distress; Head:  Normocephalic, atraumatic; Eyes: EOMI, PERRL, No scleral icterus; ENMT: Mouth and pharynx normal, Mucous membranes moist; Neck: Supple, Full range of motion, No lymphadenopathy; Cardiovascular: Regular rate and rhythm, No gallop; Respiratory: Breath sounds clear & equal bilaterally, No rales, rhonchi, wheezes.  Speaking full sentences with ease, Normal respiratory effort/excursion; Chest: Nontender, Movement normal; Abdomen: Soft, +mild LLQ tenderness to palp. No rebound or guarding. Nondistended, Normal bowel sounds;; Extremities: Pulses normal, No tenderness, No edema, No calf edema or asymmetry.; Neuro: AA&Ox3, Major CN grossly intact.  Speech clear. No gross focal motor or sensory deficits in extremities.; Skin: Color normal, Warm, Dry.   ED Course  Procedures     MDM  MDM Reviewed: nursing note, vitals and previous chart Reviewed previous: labs and ECG Interpretation: labs, ECG, CT scan and x-ray    Date: 09/30/2012  Rate: 62  Rhythm: normal sinus rhythm  QRS Axis: normal  Intervals: normal  ST/T Wave abnormalities: normal  Conduction Disutrbances:none  Narrative Interpretation:   Old EKG Reviewed: unchanged; no significant changes from previous EKG dated 09/28/2012.  Results for orders placed during the hospital encounter of 09/30/12  CBC      Component Value Range   WBC 7.1  4.0 - 10.5 K/uL   RBC 4.97  3.87 - 5.11 MIL/uL   Hemoglobin 13.9  12.0 - 15.0 g/dL   HCT 47.8  29.5 - 62.1 %   MCV 83.5  78.0 -  100.0 fL   MCH 28.0  26.0 - 34.0 pg   MCHC 33.5  30.0 - 36.0 g/dL   RDW 30.8  65.7 - 84.6 %   Platelets 323  150 - 400 K/uL  COMPREHENSIVE METABOLIC PANEL  Component Value Range   Sodium 136  135 - 145 mEq/L   Potassium 3.5  3.5 - 5.1 mEq/L   Chloride 99  96 - 112 mEq/L   CO2 27  19 - 32 mEq/L   Glucose, Bld 94  70 - 99 mg/dL   BUN 7  6 - 23 mg/dL   Creatinine, Ser 4.13  0.50 - 1.10 mg/dL   Calcium 9.6  8.4 - 24.4 mg/dL   Total Protein 7.2  6.0 - 8.3 g/dL   Albumin 3.6  3.5 - 5.2 g/dL   AST 15  0 - 37 U/L   ALT 21  0 - 35 U/L   Alkaline Phosphatase 66  39 - 117 U/L   Total Bilirubin 0.4  0.3 - 1.2 mg/dL   GFR calc non Af Amer 82 (*) >90 mL/min   GFR calc Af Amer >90  >90 mL/min  URINALYSIS, ROUTINE W REFLEX MICROSCOPIC      Component Value Range   Color, Urine YELLOW  YELLOW   APPearance CLEAR  CLEAR   Specific Gravity, Urine <1.005 (*) 1.005 - 1.030   pH 5.5  5.0 - 8.0   Glucose, UA NEGATIVE  NEGATIVE mg/dL   Hgb urine dipstick NEGATIVE  NEGATIVE   Bilirubin Urine NEGATIVE  NEGATIVE   Ketones, ur NEGATIVE  NEGATIVE mg/dL   Protein, ur NEGATIVE  NEGATIVE mg/dL   Urobilinogen, UA 0.2  0.0 - 1.0 mg/dL   Nitrite NEGATIVE  NEGATIVE   Leukocytes, UA NEGATIVE  NEGATIVE  TROPONIN I      Component Value Range   Troponin I <0.30  <0.30 ng/mL  LACTIC ACID, PLASMA      Component Value Range   Lactic Acid, Venous 0.8  0.5 - 2.2 mmol/L  LIPASE, BLOOD      Component Value Range   Lipase 24  11 - 59 U/L   Dg Chest 2 View 09/30/2012  *RADIOLOGY REPORT*  Clinical Data: Chest pain, abdominal pain, nausea  CHEST - 2 VIEW  Comparison: 09/28/2012; 09/20/2008; 04/29/2006; chest CT - 04/29/2006  Findings:  Grossly unchanged borderline enlarged cardiac silhouette and mediastinal contours with slight differences attributable to slightly decreased lung volumes and patient rotation to the left. Grossly unchanged minimal left basilar linear opacities are grossly unchanged since the 2070  examination and favored to represent chronic atelectasis or scar.  No focal airspace opacities.  The lungs are hyperexpanded with flattening of bilateral hemidiaphragms.  No pleural effusion or pneumothorax.  IMPRESSION: Hyperexpanded lungs without acute cardiopulmonary disease.   Original Report Authenticated By: Tacey Ruiz, MD    Ct Abdomen Pelvis W Contrast 09/30/2012  *RADIOLOGY REPORT*  Clinical Data: Abdominal pain, nausea, vomiting and diarrhea.  CT ABDOMEN AND PELVIS WITH CONTRAST  Technique:  Multidetector CT imaging of the abdomen and pelvis was performed following the standard protocol during bolus administration of intravenous contrast.  Contrast: OMNIPAQUE IOHEXOL 300 MG/ML  SOLN  Comparison: CT abdomen and pelvis 05/03/2006.  Findings: The lung bases are clear.  There is no pleural or pericardial effusion.  The patient is status post cholecystectomy. Hemangioma in the inferior right hepatic lobe seen on the prior study is not well demonstrated on this examination.  The liver appears normal.  The biliary tree, spleen, pancreas, right adrenal gland and right kidney appear normal.  There is an unchanged low attenuating lesion in the left adrenal gland consistent with an adenoma.  The patient has a new small left renal cyst.  The left kidney is otherwise unremarkable.  The patient is status post hysterectomy. A few scattered colonic diverticula are seen without diverticulitis.  The colon is otherwise unremarkable.  The stomach and small bowel appear normal. There is no lymphadenopathy or fluid.  No focal bony abnormality is identified.  Left hip replacement is noted.  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 Scattered degenerative disc disease appears worst at L3-4.  IMPRESSION:  1.  No acute finding. 2.  Status post cholecystectomy. 3.  Unchanged left adrenal adenoma. 4.  Lumbar and right hip degenerative disease.   Original Report Authenticated By: Holley Dexter, M.D.      1755:   Doubt ACS as cause for CP given normal EKG and troponin after 4-5 days of constant symptoms. Pt has tol PO well while in the ED without N/V, and has not stooled while in the ED for the past 6 hours.  Has ambulated with steady gait, easy resps.  VSS. Wants to go home now.  EPIC chart review reveals pt has had extensive hx of same recurrent symptoms since 2006, dx with "spasmotic colon."  Will tx symptomatically at this time.  Dx and testing d/w pt and family.  Questions answered.  Verb understanding, agreeable to d/c home with outpt f/u.            Laray Anger, DO 10/02/12 1815

## 2012-09-30 NOTE — ED Notes (Signed)
Pt ambulates well. To bathroom for ua spec, no in and out cath needed per Dr. Clarene Duke

## 2012-09-30 NOTE — ED Notes (Signed)
Pt here for abd pain and chest pain, here for same on Monday, no appetite per pt, nausea, denies vomiting

## 2012-10-02 LAB — URINE CULTURE
Colony Count: NO GROWTH
Culture: NO GROWTH

## 2012-10-05 ENCOUNTER — Encounter (HOSPITAL_COMMUNITY): Payer: Self-pay | Admitting: Emergency Medicine

## 2012-10-05 ENCOUNTER — Emergency Department (HOSPITAL_COMMUNITY)
Admission: EM | Admit: 2012-10-05 | Discharge: 2012-10-05 | Disposition: A | Discharge status: Home / Self Care | Payer: Medicare Other | Attending: Emergency Medicine | Admitting: Emergency Medicine

## 2012-10-05 ENCOUNTER — Emergency Department (HOSPITAL_COMMUNITY): Payer: Medicare Other

## 2012-10-05 DIAGNOSIS — Z8711 Personal history of peptic ulcer disease: Secondary | ICD-10-CM | POA: Insufficient documentation

## 2012-10-05 DIAGNOSIS — Z79899 Other long term (current) drug therapy: Secondary | ICD-10-CM | POA: Insufficient documentation

## 2012-10-05 DIAGNOSIS — Z8719 Personal history of other diseases of the digestive system: Secondary | ICD-10-CM | POA: Insufficient documentation

## 2012-10-05 DIAGNOSIS — R05 Cough: Secondary | ICD-10-CM

## 2012-10-05 DIAGNOSIS — Z7982 Long term (current) use of aspirin: Secondary | ICD-10-CM | POA: Insufficient documentation

## 2012-10-05 DIAGNOSIS — F17200 Nicotine dependence, unspecified, uncomplicated: Secondary | ICD-10-CM | POA: Insufficient documentation

## 2012-10-05 DIAGNOSIS — Z8739 Personal history of other diseases of the musculoskeletal system and connective tissue: Secondary | ICD-10-CM | POA: Insufficient documentation

## 2012-10-05 DIAGNOSIS — Z8619 Personal history of other infectious and parasitic diseases: Secondary | ICD-10-CM | POA: Insufficient documentation

## 2012-10-05 DIAGNOSIS — I10 Essential (primary) hypertension: Secondary | ICD-10-CM | POA: Insufficient documentation

## 2012-10-05 DIAGNOSIS — R059 Cough, unspecified: Secondary | ICD-10-CM | POA: Insufficient documentation

## 2012-10-05 DIAGNOSIS — Z8679 Personal history of other diseases of the circulatory system: Secondary | ICD-10-CM | POA: Insufficient documentation

## 2012-10-05 DIAGNOSIS — J440 Chronic obstructive pulmonary disease with acute lower respiratory infection: Secondary | ICD-10-CM | POA: Insufficient documentation

## 2012-10-05 DIAGNOSIS — Z862 Personal history of diseases of the blood and blood-forming organs and certain disorders involving the immune mechanism: Secondary | ICD-10-CM | POA: Insufficient documentation

## 2012-10-05 DIAGNOSIS — IMO0001 Reserved for inherently not codable concepts without codable children: Secondary | ICD-10-CM | POA: Insufficient documentation

## 2012-10-05 DIAGNOSIS — J209 Acute bronchitis, unspecified: Secondary | ICD-10-CM | POA: Insufficient documentation

## 2012-10-05 DIAGNOSIS — E7800 Pure hypercholesterolemia, unspecified: Secondary | ICD-10-CM | POA: Insufficient documentation

## 2012-10-05 DIAGNOSIS — Z8639 Personal history of other endocrine, nutritional and metabolic disease: Secondary | ICD-10-CM | POA: Insufficient documentation

## 2012-10-05 DIAGNOSIS — Z86718 Personal history of other venous thrombosis and embolism: Secondary | ICD-10-CM | POA: Insufficient documentation

## 2012-10-05 DIAGNOSIS — K219 Gastro-esophageal reflux disease without esophagitis: Secondary | ICD-10-CM | POA: Insufficient documentation

## 2012-10-05 MED ORDER — PREDNISONE 50 MG PO TABS
60.0000 mg | ORAL_TABLET | Freq: Once | ORAL | Status: AC
  Administered 2012-10-05: 60 mg via ORAL
  Filled 2012-10-05: qty 1

## 2012-10-05 MED ORDER — GUAIFENESIN-CODEINE 100-10 MG/5ML PO SYRP: ORAL_SOLUTION | ORAL | Status: DC

## 2012-10-05 MED ORDER — AZITHROMYCIN 250 MG PO TABS
500.0000 mg | ORAL_TABLET | Freq: Once | ORAL | Status: AC
  Administered 2012-10-05: 500 mg via ORAL
  Filled 2012-10-05: qty 2

## 2012-10-05 MED ORDER — PREDNISONE 20 MG PO TABS: 40.0000 mg | ORAL_TABLET | Freq: Every day | ORAL | Status: DC

## 2012-10-05 MED ORDER — CEFTRIAXONE SODIUM 1 G IJ SOLR
1.0000 g | Freq: Once | INTRAMUSCULAR | Status: AC
  Administered 2012-10-05: 1 g via INTRAMUSCULAR
  Filled 2012-10-05: qty 10

## 2012-10-05 MED ORDER — LIDOCAINE HCL (PF) 1 % IJ SOLN
INTRAMUSCULAR | Status: AC
  Filled 2012-10-05: qty 5

## 2012-10-05 MED ORDER — AZITHROMYCIN 250 MG PO TABS: ORAL_TABLET | ORAL | Status: DC

## 2012-10-05 MED ORDER — ALBUTEROL SULFATE HFA 108 (90 BASE) MCG/ACT IN AERS: 1.0000 | INHALATION_SPRAY | Freq: Four times a day (QID) | RESPIRATORY_TRACT | Status: DC | PRN

## 2012-10-05 NOTE — ED Provider Notes (Signed)
Medical screening examination/treatment/procedure(s) were performed by non-physician practitioner and as supervising physician I was immediately available for consultation/collaboration.   Flint Melter, MD 10/05/12 [phone]

## 2012-10-05 NOTE — ED Provider Notes (Signed)
History     CSN: [id number]  Arrival date & time 10/05/12  1041   First MD Initiated Contact with Patient 10/05/12 1112      Chief Complaint  Patient presents with  . Influenza    (Consider location/radiation/quality/duration/timing/severity/associated sxs/prior treatment) HPI Comments: Here 1 week ago and had nl CXR.  Breathing has worsened and she is wheezing.  "i think i have pneumonia".  Pt of dr. Earvin Hansen [facility].  Patient is a 71 y.o. female presenting with flu symptoms. The history is provided by the patient. No language interpreter was used.  Influenza This is a new problem. Episode onset: NP cough > 1 week. The problem occurs constantly. The problem has been gradually worsening. Associated symptoms include coughing and myalgias. Pertinent negatives include no chest pain, chills or fever. Nothing aggravates the symptoms. She has tried nothing for the symptoms.    Past Medical History  Diagnosis Date  . Hypertension   . Paroxysmal SVT (supraventricular tachycardia)   . Hypercholesteremia   . GERD (gastroesophageal reflux disease)   . PUD (peptic ulcer disease)   . DVT (deep venous thrombosis) 2007  . Adrenal adenoma     left  . Thyroid nodule     left  . H. pylori infection   . Diverticulosis   . 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 Diarrhea     recurrent  . Arthritis   . Hiatal hernia     Past Surgical History  Procedure Date  . Total knee arthroplasty bilateral knee  . Hip  replacement left  . Appendectomy   . Cholecystectomy   . Abdominal hysterectomy   . Breast lumpectomy     left  . Ablation     of SVT  . Cardiac catheterization 2009    normal coronary arteries    History reviewed. No pertinent family history.  History  Substance Use Topics  . Smoking status: Current Some Day Smoker  . Smokeless tobacco: Not on file  . Alcohol Use: Yes     Comment: occasionally    OB History    Grav Para Term Preterm  Abortions TAB SAB Ect Mult Living                  Review of Systems  Constitutional: Negative for fever and chills.  Respiratory: Positive for cough and wheezing. Negative for shortness of breath.   Cardiovascular: Negative for chest pain and leg swelling.  Musculoskeletal: Positive for myalgias.  All other systems reviewed and are negative.    Allergies  Review of patient's allergies indicates no known allergies.  Home Medications   Current Outpatient Rx  Name  Route  Sig  Dispense  Refill  . ASPIRIN EC 81 MG PO TBEC   Oral   Take 81 mg by mouth every morning.          . ASPIRIN EFFERVESCENT 325 MG PO TBEF   Oral   Take 325 mg by mouth every 6 (six) hours as needed.         . CHLORDIAZEPOXIDE HCL 10 MG PO CAPS   Oral   Take 10 mg by mouth at bedtime. For 15 days         . DICYCLOMINE HCL 20 MG PO TABS   Oral   Take 1 tablet (20 mg total) by mouth every 6 (six) hours as needed (abdominal cramping).   15 tablet   0   . DILTIAZEM HCL  ER BEADS 360 MG PO CP24   Oral   Take 360 mg by mouth every morning.          Marland Kitchen LOVASTATIN 40 MG PO TABS   Oral   Take 80 mg by mouth at bedtime.         Marland Kitchen METOPROLOL SUCCINATE ER 50 MG PO TB24   Oral   Take 50 mg by mouth every morning.          Marland Kitchen ONDANSETRON HCL 4 MG PO TABS   Oral   Take 1 tablet (4 mg total) by mouth every 8 (eight) hours as needed for nausea.   6 tablet   0   . PANTOPRAZOLE SODIUM 40 MG PO TBEC   Oral   Take 40 mg by mouth 2 (two) times daily.           . ALBUTEROL SULFATE HFA 108 (90 BASE) MCG/ACT IN AERS   Inhalation   Inhale 1-2 puffs into the lungs every 6 (six) hours as needed for wheezing.   1 Inhaler   0   . AZITHROMYCIN 250 MG PO TABS      One tab po QD (initial dose given in the ED)   4 tablet   0   . GUAIFENESIN-CODEINE 100-10 MG/5ML PO SYRP      10 mls po q 4-6 hrs prn  cough   240 mL   0   . PREDNISONE 20 MG PO TABS   Oral   Take 2 tablets (40 mg total) by  mouth daily.   10 tablet   0     BP 122/79  Pulse 84  Temp 99.1 F (37.3 C) (Oral)  Resp 20  Ht 5\' 6"  (1.676 m)  Wt 200 lb (90.719 kg)  BMI 32.28 kg/m2  SpO2 97%  Physical Exam  Nursing note and vitals reviewed. Constitutional: She is oriented to person, place, and time. She appears well-developed and well-nourished. No distress.  HENT:  Head: Normocephalic and atraumatic.  Eyes: EOM are normal.  Neck: Normal range of motion.  Cardiovascular: Normal rate, regular rhythm and normal heart sounds.   Pulmonary/Chest: Effort normal. No accessory muscle usage. Not tachypneic. No respiratory distress. She has no decreased breath sounds. She has wheezes. She has no rhonchi. She exhibits no tenderness.  Abdominal: Soft. She exhibits no distension. There is no tenderness.  Musculoskeletal: Normal range of motion.  Neurological: She is alert and oriented to person, place, and time.  Skin: Skin is warm and dry.  Psychiatric: She has a normal mood and affect. Judgment normal.    ED Course  Procedures (including critical care time)  Labs Reviewed - No data to display Dg Chest 2 View  10/05/2012  *RADIOLOGY REPORT*  Clinical Data: Influenza  CHEST - 2 VIEW  Comparison: 09/30/2012  Findings: Normal heart size.  Clear lungs.  Hyperaeration. Bronchitic changes.  No pneumothorax.  No pleural effusion. Degenerative change of the right glenohumeral joint.  Degenerative changes in the thoracic spine.  IMPRESSION: Stigmata of COPD.  No active cardiopulmonary disease.   Original Report Authenticated By: Jolaine Click, M.D.      1. Cough   2. COPD (chronic obstructive pulmonary disease) with acute bronchitis       MDM  No PNA.  + COPD    rx-zithromax, 4 rx-albuterol HFA rx-prednisone 40 mg x 5 days F/u with dr. Loleta Chance. rx-robitussin AC, 240 ml        Evalina Field, [place] 10/05/12 1250

## 2012-10-05 NOTE — ED Notes (Signed)
Pt states body aches and soreness all over. Pt has had fever and cough as well.

## 2012-10-05 NOTE — ED Notes (Signed)
Sick since before 12/25, seen here for same.  Cont to have cough, nonproductive  , body aches.  " I think I have pneumonia"

## 2012-11-04 ENCOUNTER — Ambulatory Visit (INDEPENDENT_AMBULATORY_CARE_PROVIDER_SITE_OTHER): Payer: Medicare Other | Admitting: Gastroenterology

## 2012-11-04 ENCOUNTER — Encounter: Payer: Self-pay | Admitting: Gastroenterology

## 2012-11-04 VITALS — BP 133/70 | HR 66 | Temp 97.7°F | Ht 66.0 in | Wt 203.0 lb

## 2012-11-04 DIAGNOSIS — E663 Overweight: Secondary | ICD-10-CM

## 2012-11-04 DIAGNOSIS — K3189 Other diseases of stomach and duodenum: Secondary | ICD-10-CM

## 2012-11-04 DIAGNOSIS — R1013 Epigastric pain: Secondary | ICD-10-CM

## 2012-11-04 MED ORDER — DIETHYLPROPION HCL 25 MG PO TABS: ORAL_TABLET | ORAL | Status: DC

## 2012-11-04 NOTE — Assessment & Plan Note (Addendum)
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  FOLLOW A LOW FAT DIET. HO GIVEN.  AVOID DAIRY AND HIGH FAT FOODS. DISCUSSED  LIKELY RELATION BETWEEN DAIRY(ICE CREAM) & FRIED CHICKEN TO CAUSING ABD PAIN/DIARRHEA.  IF  DIARRHEA, USE IMODIUM AS NEEDED UP TO 4 TIMES A DAY.  FOLLOW UP IN 2 MOS.

## 2012-11-04 NOTE — Progress Notes (Signed)
Subjective:    Patient ID: [name], female    DOB: [birth year], 72 y.o.   MRN: [id number]  PCP: [facility]  HPI SX OFF AND FOR A COUPLE FOR 2-3 MOS. MAY HAVE NORMAL BMs. DEALING WITH SICK HUSBAND JAN 2013. RUNNING SINCE LAST YEAR. GETS BLOATED EASILY. WASN'T REALLY TAKING RX MEDS. TRYING HOME REMEDIES AND OTC MEDS. LAST FLARE END OF December. Loves to eat. Stopped smoking. MAY EAT A WHOLE GALLON OF ICE CREAM IN 2 DAYS. DOESN'T NEED INH BECAUSE SHE STOPPED SMOKING.   Past Medical History  Diagnosis Date  . Hypertension   . Paroxysmal SVT (supraventricular tachycardia)   . Hypercholesteremia   . GERD (gastroesophageal reflux disease)   . PUD (peptic ulcer disease)   . DVT (deep venous thrombosis) 2007  . Adrenal adenoma     left  . Thyroid nodule     left  . H. pylori infection   . Diverticulosis   . 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 Diarrhea     recurrent  . Arthritis   . Hiatal hernia     Past Surgical History  Procedure Date  . Total knee arthroplasty bilateral knee  . Hip  replacement left  . Appendectomy   . Cholecystectomy   . Abdominal hysterectomy   . Breast lumpectomy     left  . Ablation     of SVT  . Cardiac catheterization 2009    normal coronary arteries  . Colonoscopy 09/13/2009    [id number] internal hemorrhoids  . Esophagogastroduodenoscopy 09/13/2009    [id number]:[id number]/   No Known Allergies  Current Outpatient Prescriptions  Medication Sig Dispense Refill  . albuterol (PROVENTIL HFA;VENTOLIN HFA) 108 (90 BASE) MCG/ACT inhaler Inhale 1-2 puffs into the lungs every 6 (six) hours as needed for wheezing.    Marland Kitchen aspirin EC 81 MG tablet Take 81 mg by mouth every morning.     .      . diltiazem (TIAZAC) 360 MG 24 hr capsule Take 360 mg by mouth every morning.     . lovastatin (MEVACOR) 40 MG tablet Take 80 mg by mouth at bedtime.    . metoprolol (TOPROL-XL) 50 MG 24 hr tablet Take 50 mg by mouth every  morning.     . pantoprazole (PROTONIX) 40 MG tablet Take 40 mg by mouth 2 (two) times daily.      .      .      .      .      .      .          Review of Systems     Objective:   Physical Exam  Vitals reviewed. Constitutional: She is oriented to person, place, and time. She appears well-nourished. No distress.  HENT:  Head: Normocephalic and atraumatic.  Mouth/Throat: No oropharyngeal exudate.  Eyes: Pupils are equal, round, and reactive to light. No scleral icterus.  Neck: Normal range of motion. Neck supple.  Cardiovascular: Normal rate, regular rhythm and normal heart sounds.   Pulmonary/Chest: Effort normal and breath sounds normal. No respiratory distress.  Abdominal: Soft. Bowel sounds are normal. She exhibits no distension. There is no tenderness.       OBESE  Musculoskeletal: Normal range of motion. She exhibits no edema.  Lymphadenopathy:    She has no cervical adenopathy.  Neurological: She is alert and oriented to person, place, and time.  NO FOCAL DEFICITS   Psychiatric: She has a normal mood and affect.          Assessment & Plan:

## 2012-11-04 NOTE — Assessment & Plan Note (Signed)
INTERESTED IN WEIGHT LOSS.GAINED WEIGHT SINCE SHE STOPPED SMOKING.  PT INTERESTED IN MEDICATION TO CURB APPETITE. I PERSONALLY REVIEWED PRECAUTION/CONTRAINDICATIONS WITH PT. SIDE EFFECT HO GIVEN.  TAKE TENUATE 1 HOUR PRIOR TO MEALS UP TO THREE TIMES A DAY. RX FOR JAN 29, FEB 28 GIVEN. PT SHOULD TRY TO LOSE 10-20 LBS INTHE NEXT 2 MOS. FOLLOW UP IN 2 MOS. CALL ME IF YOU THINK YOU ARE HAVING SIDE EFFECTS FROM TENUATE.

## 2012-11-04 NOTE — Progress Notes (Signed)
Faxed to PCP

## 2012-11-04 NOTE — Patient Instructions (Addendum)
TAKE TENUATE 1 HOUR PRIOR TO MEALS UP TO THREE TIMES A DAY. SEE HANDOUT FOR SIDE EFFECTS.  YOU SHOULD TRY TO LOSE 10-20 LBS INTHE NEXT 2 MOS.  FOLLOW A LOW FAT DIET. SEE INFO BELOW.  AVOID DAIRY AND HIGH FAT FOODS. THEY WILL BOTH CAUSE YOU TO HAVE ABDOMINAL PAIN AND DIARRHEA.  IF YOU HAVE DIARRHEA, USE IMODIUM AS NEEDED UP TO 4 TIMES A DAY.  FOLLOW UP IN 2 MOS. CALL ME IF YOU THINK YOU ARE HAVING SIDE EFFECTS FROM TENUATE.  Low-Fat Diet BREADS, CEREALS, PASTA, RICE, DRIED PEAS, AND BEANS These products are high in carbohydrates and most are low in fat. Therefore, they can be increased in the diet as substitutes for fatty foods. They too, however, contain calories and should not be eaten in excess. Cereals can be eaten for snacks as well as for breakfast.   FRUITS AND VEGETABLES It is good to eat fruits and vegetables. Besides being sources of fiber, both are rich in vitamins and some minerals. They help you get the daily allowances of these nutrients. Fruits and vegetables can be used for snacks and desserts.  MEATS Limit lean meat, chicken, [place], and fish to no more than 6 ounces per day. Beef, Pork, and Lamb Use lean cuts of beef, pork, and lamb. Lean cuts include:  Extra-lean ground beef.  Arm roast.  Sirloin tip.  Center-cut ham.  Round steak.  Loin chops.  Rump roast.  Tenderloin.  Trim all fat off the outside of meats before cooking. It is not necessary to severely decrease the intake of red meat, but lean choices should be made. Lean meat is rich in protein and contains a highly absorbable form of iron. Premenopausal women, in particular, should avoid reducing lean red meat because this could increase the risk for low red blood cells (iron-deficiency anemia).  Chicken and [place] These are good sources of protein. The fat of poultry can be reduced by removing the skin and underlying fat layers before cooking. Chicken and [place] can be substituted for lean red meat in the  diet. Poultry should not be fried or covered with high-fat sauces. Fish and Shellfish Fish is a good source of protein. Shellfish contain cholesterol, but they usually are low in saturated fatty acids. The preparation of fish is important. Like chicken and [place], they should not be fried or covered with high-fat sauces. EGGS Egg whites contain no fat or cholesterol. They can be eaten often. Try 1 to 2 egg whites instead of whole eggs in recipes or use egg substitutes that do not contain yolk. MILK AND DAIRY PRODUCTS Use skim or 1% milk instead of 2% or whole milk. Decrease whole milk, natural, and processed cheeses. Use nonfat or low-fat (2%) cottage cheese or low-fat cheeses made from vegetable oils. Choose nonfat or low-fat (1 to 2%) yogurt. Experiment with evaporated skim milk in recipes that call for heavy cream. Substitute low-fat yogurt or low-fat cottage cheese for sour cream in dips and salad dressings. Have at least 2 servings of low-fat dairy products, such as 2 glasses of skim (or 1%) milk each day to help get your daily calcium intake. FATS AND OILS Reduce the total intake of fats, especially saturated fat. Butterfat, lard, and beef fats are high in saturated fat and cholesterol. These should be avoided as much as possible. Vegetable fats do not contain cholesterol, but certain vegetable fats, such as coconut oil, palm oil, and palm kernel oil are very high in saturated fats.  These should be limited. These fats are often used in bakery goods, processed foods, popcorn, oils, and nondairy creamers. Vegetable shortenings and some peanut butters contain hydrogenated oils, which are also saturated fats. Read the labels on these foods and check for saturated vegetable oils. Unsaturated vegetable oils and fats do not raise blood cholesterol. However, they should be limited because they are fats and are high in calories. Total fat should still be limited to 30% of your daily caloric intake. Desirable  liquid vegetable oils are corn oil, cottonseed oil, olive oil, canola oil, safflower oil, soybean oil, and sunflower oil. Peanut oil is not as good, but small amounts are acceptable. Buy a heart-healthy tub margarine that has no partially hydrogenated oils in the ingredients. Mayonnaise and salad dressings often are made from unsaturated fats, but they should also be limited because of their high calorie and fat content. Seeds, nuts, peanut butter, olives, and avocados are high in fat, but the fat is mainly the unsaturated type. These foods should be limited mainly to avoid excess calories and fat. OTHER EATING TIPS Snacks  Most sweets should be limited as snacks. They tend to be rich in calories and fats, and their caloric content outweighs their nutritional value. Some good choices in snacks are graham crackers, melba toast, soda crackers, bagels (no egg), English muffins, fruits, and vegetables. These snacks are preferable to snack crackers, [place] fries, TORTILLA CHIPS, and POTATO chips. Popcorn should be air-popped or cooked in small amounts of liquid vegetable oil. Desserts Eat fruit, low-fat yogurt, and fruit ices instead of pastries, cake, and cookies. Sherbet, angel food cake, gelatin dessert, frozen low-fat yogurt, or other frozen products that do not contain saturated fat (pure fruit juice bars, frozen ice pops) are also acceptable.  COOKING METHODS Choose those methods that use little or no fat. They include: Poaching.  Braising.  Steaming.  Grilling.  Baking.  Stir-frying.  Broiling.  Microwaving.  Foods can be cooked in a nonstick pan without added fat, or use a nonfat cooking spray in regular cookware. Limit fried foods and avoid frying in saturated fat. Add moisture to lean meats by using water, broth, cooking wines, and other nonfat or low-fat sauces along with the cooking methods mentioned above. Soups and stews should be chilled after cooking. The fat that forms on top after a  few hours in the refrigerator should be skimmed off. When preparing meals, avoid using excess salt. Salt can contribute to raising blood pressure in some people.  EATING AWAY FROM HOME Order entres, potatoes, and vegetables without sauces or butter. When meat exceeds the size of a deck of cards (3 to 4 ounces), the rest can be taken home for another meal. Choose vegetable or fruit salads and ask for low-calorie salad dressings to be served on the side. Use dressings sparingly. Limit high-fat toppings, such as bacon, crumbled eggs, cheese, sunflower seeds, and olives. Ask for heart-healthy tub margarine instead of butter.

## 2012-11-05 ENCOUNTER — Telehealth: Payer: Self-pay

## 2012-11-05 NOTE — Progress Notes (Signed)
Pt is aware of OV on 3/27 at 11 with SF in E30

## 2012-11-05 NOTE — Telephone Encounter (Signed)
[name] at [facility] called and said the prescription for the [place] had 12/04/2012 on it. Per Dr. Darrick Penna, pt had one for Jan and one for Feb. They will call and tell the pt and let me know if there is any problems.

## 2012-11-11 ENCOUNTER — Other Ambulatory Visit: Payer: Self-pay | Admitting: *Deleted

## 2012-11-11 MED ORDER — LOVASTATIN 40 MG PO TABS: 80.0000 mg | ORAL_TABLET | Freq: Every day | ORAL | Status: AC

## 2012-12-31 ENCOUNTER — Ambulatory Visit: Payer: Medicare Other | Admitting: Gastroenterology

## 2013-05-04 ENCOUNTER — Encounter: Payer: Self-pay | Admitting: Gastroenterology

## 2013-05-04 ENCOUNTER — Other Ambulatory Visit: Payer: Self-pay | Admitting: Cardiology

## 2013-06-02 ENCOUNTER — Ambulatory Visit (INDEPENDENT_AMBULATORY_CARE_PROVIDER_SITE_OTHER): Payer: Medicare Other | Admitting: Gastroenterology

## 2013-06-02 ENCOUNTER — Other Ambulatory Visit: Payer: Self-pay | Admitting: Gastroenterology

## 2013-06-02 ENCOUNTER — Encounter: Payer: Self-pay | Admitting: Gastroenterology

## 2013-06-02 ENCOUNTER — Ambulatory Visit (HOSPITAL_COMMUNITY)
Admission: RE | Admit: 2013-06-02 | Discharge: 2013-06-02 | Disposition: A | Discharge status: Home / Self Care | Payer: Medicare Other | Source: Ambulatory Visit | Attending: Gastroenterology | Admitting: Gastroenterology

## 2013-06-02 VITALS — BP 101/71 | HR 87 | Temp 97.2°F | Ht 66.0 in | Wt 206.6 lb

## 2013-06-02 DIAGNOSIS — R1013 Epigastric pain: Secondary | ICD-10-CM

## 2013-06-02 DIAGNOSIS — R109 Unspecified abdominal pain: Secondary | ICD-10-CM | POA: Insufficient documentation

## 2013-06-02 DIAGNOSIS — Z791 Long term (current) use of non-steroidal anti-inflammatories (NSAID): Secondary | ICD-10-CM

## 2013-06-02 DIAGNOSIS — K3189 Other diseases of stomach and duodenum: Secondary | ICD-10-CM | POA: Insufficient documentation

## 2013-06-02 NOTE — Progress Notes (Signed)
Pt returned call and was informed.  

## 2013-06-02 NOTE — Progress Notes (Signed)
Subjective:    Patient ID: Orland Jarred, female    DOB: [birth year], 72 y.o.   MRN: [id number]  Evlyn Courier, MD  HPI LOST HER HUSBAND 20 YRS IN MAR 2014. BOWELS BEEN OUT WHACK SINCE BEFORE AND AFTER HE PASSES. CARAFATE DID NOT HELP. NOT TAKING A PROBIOTIC. IF SHE DRINKS WATER IT HURTS. MUST STRAIN TO BREAK WIND. BMs: EVERY AM AFTER AM CEREAL. MAY HAVE BM 1-2 TIMES THROUGHOUT THE DAY BUT NOT A WHOLE LOT. GETS COLD SOMETIME. TAKING BC POWDER FOR SHOULDER BLADE PAIN. HAD SEVERE BURNING IN HER STOMACH. DRINKING A WEIGHT LOSS. LIKES SPINACH AND HAD DARK STOOL. NO WATERY STOOLS. MAY HAVE #5 LATER ON IN THE DAY. STRAINS TO GET POO OUT. BREAKING WIND MAY BE DIFFICULT. CAN HAVE A BAD TASTE IN HER MOUTH: EVERY DAY AND IT'S LIKE THAT NOW. NO ABX IN 2014. CRAMPY ABD PAIN THAT'S PRETTY MUCH THERE ALL THE TIME. BETTER WITH FOOD. FEELS BLOATED ALL THE TIME. WAS EATING ACTIVIA AND STOPPED. STARTED SMOKING AGAIN.  PT DENIES FEVER, CHILLS, BRBPR, nausea, vomiting, melena, diarrhea, constipation, problems swallowing, OR heartburn or indigestion.  Past Medical History  Diagnosis Date  . Hypertension   . Paroxysmal SVT (supraventricular tachycardia)   . Hypercholesteremia   . GERD (gastroesophageal reflux disease)   . PUD (peptic ulcer disease)   . DVT (deep venous thrombosis) 2007  . Adrenal adenoma     left  . Thyroid nodule     left  . H. pylori infection   . Diverticulosis   . 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 Diarrhea     recurrent  . Arthritis   . Hiatal hernia    Past Surgical History  Procedure Laterality Date  . Total knee arthroplasty  bilateral knee  . Hip  replacement  left  . Appendectomy    . Cholecystectomy    . Abdominal hysterectomy    . Breast lumpectomy      left  . Ablation      of SVT  . Cardiac catheterization  2009    normal coronary arteries  . Colonoscopy  09/13/2009    [id number] internal hemorrhoids  .  Esophagogastroduodenoscopy  09/13/2009    HQI:[id number]/   No Known Allergies  Current Outpatient Prescriptions  Medication Sig Dispense Refill  . albuterol (PROVENTIL HFA;VENTOLIN HFA) 108 (90 BASE) MCG/ACT inhaler Inhale 1-2 puffs into the lungs every 6 (six) hours as needed for wheezing.    Marland Kitchen aspirin EC 81 MG tablet Take 81 mg by mouth every morning.     Marland Kitchen CARAFATE 1 GM/10ML suspension Take 1 g by mouth 2 (two) times daily.  DIDN'T HELP   .      . dicyclomine (BENTYL) 20 MG tablet BID    . diltiazem (TIAZAC) 360 MG 24 hr capsule Take 360 mg by mouth every morning.     . lovastatin (MEVACOR) 40 MG tablet Take 2 tablets (80 mg total) by mouth at bedtime.    . metoprolol (TOPROL-XL) 50 MG 24 hr tablet Take 50 mg by mouth every morning.     . pantoprazole (PROTONIX) 40 MG tablet Take 40 mg by mouth 2 (two) times daily.      Marland Kitchen TAZTIA XT 360 MG 24 hr capsule TAKE 1 CAPSULE BY MOUTH DAILY.    Marland Kitchen aspirin-sod bicarb-citric acid (ALKA-SELTZER) 325 MG TBEF Take 325 mg by mouth every 6 (six) hours as needed. NOT TAKING THAT   .  azithromycin (ZITHROMAX Z-PAK) 250 MG tablet One tab po QD (initial dose given in the ED)    . chlordiazePOXIDE (LIBRIUM) 10 MG capsule Take 10 mg by mouth at bedtime. For 15 days    .      .      . ondansetron (ZOFRAN) 4 MG tablet Take 1 tablet (4 mg total) by mouth every 8 (eight) hours as needed for nausea.    .       Review of Systems     Objective:   Physical Exam  Vitals reviewed. Constitutional: She is oriented to person, place, and time. She appears well-nourished. No distress.  HENT:  Head: Normocephalic and atraumatic.  Mouth/Throat: Oropharynx is clear and moist. No oropharyngeal exudate.  Eyes: Pupils are equal, round, and reactive to light. No scleral icterus.  Neck: Normal range of motion. Neck supple.  Cardiovascular: Normal rate, regular rhythm and normal heart sounds.   Pulmonary/Chest: Effort normal and breath sounds normal. No respiratory distress.   Abdominal: Soft. Bowel sounds are normal. She exhibits no distension. There is tenderness.  MILD TTP IN THE EPIGASTRIUM    Musculoskeletal: Normal range of motion. She exhibits no edema.  Lymphadenopathy:    She has no cervical adenopathy.  Neurological: She is alert and oriented to person, place, and time.  NO FOCAL DEFICITS   Psychiatric: She has a normal mood and affect.    History   Social History  . Marital Status: Married    Spouse Name: N/A    Number of Children: N/A  . Years of Education: N/A   Social History Main Topics  . Smoking status: Current Some Day Smoker  . Smokeless tobacco: Not on file     Comment: Quit smoking x 3 weeks  . Alcohol Use: Yes     Comment: occasionally  . Drug Use: No  .     Social History Narrative   HUSBAND PASSES MAR 2014. EATS OUT A LOT.           Assessment & Plan:

## 2013-06-02 NOTE — Assessment & Plan Note (Signed)
SX BETTER WITH FOOD. NOW ON BID PPI. DIFFERENTIAL DIAGNOSIS INCLUDES H PYLORI GASTRITIS, PUD, AND LESS LIKELY GASTRIC CA OR BOWEL OBSTRUCTION.  EGD SEP 5 KUB 2 VIEW TODAY BID PROTONIX LOW FAT DIET LOSE WEIGHT OPV IN4 MOS

## 2013-06-02 NOTE — Progress Notes (Signed)
Reminder in epic °

## 2013-06-02 NOTE — Patient Instructions (Addendum)
STOP USING CARAFATE.  UPPER ENDOSCOPY SEP 5. NPO AFTER 9 AM ON DAY OF PROCEDURE.  XRAY TODAY.  CONTINUE PROTONIX. TAKE 30 MINUTES PRIOR TO MEALS TWICE DAILY.  FOLLOW A HIGH FIBER/LOW FAT DIET. SEE INFO BELOW.  CONTINUE YOUR WEIGHT LOSS EFFORTS.  FOLLOW UP IN 4 MOS.     Low-Fat Diet BREADS, CEREALS, PASTA, RICE, DRIED PEAS, AND BEANS These products are high in carbohydrates and most are low in fat. Therefore, they can be increased in the diet as substitutes for fatty foods. They too, however, contain calories and should not be eaten in excess. Cereals can be eaten for snacks as well as for breakfast.   FRUITS AND VEGETABLES It is good to eat fruits and vegetables. Besides being sources of fiber, both are rich in vitamins and some minerals. They help you get the daily allowances of these nutrients. Fruits and vegetables can be used for snacks and desserts.  MEATS Limit lean meat, chicken, [place], and fish to no more than 6 ounces per day. Beef, Pork, and Lamb Use lean cuts of beef, pork, and lamb. Lean cuts include:  Extra-lean ground beef.  Arm roast.  Sirloin tip.  Center-cut ham.  Round steak.  Loin chops.  Rump roast.  Tenderloin.  Trim all fat off the outside of meats before cooking. It is not necessary to severely decrease the intake of red meat, but lean choices should be made. Lean meat is rich in protein and contains a highly absorbable form of iron. Premenopausal women, in particular, should avoid reducing lean red meat because this could increase the risk for low red blood cells (iron-deficiency anemia).  Chicken and [place] These are good sources of protein. The fat of poultry can be reduced by removing the skin and underlying fat layers before cooking. Chicken and [place] can be substituted for lean red meat in the diet. Poultry should not be fried or covered with high-fat sauces. Fish and Shellfish Fish is a good source of protein. Shellfish contain cholesterol, but  they usually are low in saturated fatty acids. The preparation of fish is important. Like chicken and [place], they should not be fried or covered with high-fat sauces. EGGS Egg whites contain no fat or cholesterol. They can be eaten often. Try 1 to 2 egg whites instead of whole eggs in recipes or use egg substitutes that do not contain yolk. MILK AND DAIRY PRODUCTS Use skim or 1% milk instead of 2% or whole milk. Decrease whole milk, natural, and processed cheeses. Use nonfat or low-fat (2%) cottage cheese or low-fat cheeses made from vegetable oils. Choose nonfat or low-fat (1 to 2%) yogurt. Experiment with evaporated skim milk in recipes that call for heavy cream. Substitute low-fat yogurt or low-fat cottage cheese for sour cream in dips and salad dressings. Have at least 2 servings of low-fat dairy products, such as 2 glasses of skim (or 1%) milk each day to help get your daily calcium intake. FATS AND OILS Reduce the total intake of fats, especially saturated fat. Butterfat, lard, and beef fats are high in saturated fat and cholesterol. These should be avoided as much as possible. Vegetable fats do not contain cholesterol, but certain vegetable fats, such as coconut oil, palm oil, and palm kernel oil are very high in saturated fats. These should be limited. These fats are often used in bakery goods, processed foods, popcorn, oils, and nondairy creamers. Vegetable shortenings and some peanut butters contain hydrogenated oils, which are also saturated fats. Read the  labels on these foods and check for saturated vegetable oils. Unsaturated vegetable oils and fats do not raise blood cholesterol. However, they should be limited because they are fats and are high in calories. Total fat should still be limited to 30% of your daily caloric intake. Desirable liquid vegetable oils are corn oil, cottonseed oil, olive oil, canola oil, safflower oil, soybean oil, and sunflower oil. Peanut oil is not as good, but small  amounts are acceptable. Buy a heart-healthy tub margarine that has no partially hydrogenated oils in the ingredients. Mayonnaise and salad dressings often are made from unsaturated fats, but they should also be limited because of their high calorie and fat content. Seeds, nuts, peanut butter, olives, and avocados are high in fat, but the fat is mainly the unsaturated type. These foods should be limited mainly to avoid excess calories and fat. OTHER EATING TIPS Snacks  Most sweets should be limited as snacks. They tend to be rich in calories and fats, and their caloric content outweighs their nutritional value. Some good choices in snacks are graham crackers, melba toast, soda crackers, bagels (no egg), English muffins, fruits, and vegetables. These snacks are preferable to snack crackers, [place] fries, TORTILLA CHIPS, and POTATO chips. Popcorn should be air-popped or cooked in small amounts of liquid vegetable oil. Desserts Eat fruit, low-fat yogurt, and fruit ices instead of pastries, cake, and cookies. Sherbet, angel food cake, gelatin dessert, frozen low-fat yogurt, or other frozen products that do not contain saturated fat (pure fruit juice bars, frozen ice pops) are also acceptable.  COOKING METHODS Choose those methods that use little or no fat. They include: Poaching.  Braising.  Steaming.  Grilling.  Baking.  Stir-frying.  Broiling.  Microwaving.  Foods can be cooked in a nonstick pan without added fat, or use a nonfat cooking spray in regular cookware. Limit fried foods and avoid frying in saturated fat. Add moisture to lean meats by using water, broth, cooking wines, and other nonfat or low-fat sauces along with the cooking methods mentioned above. Soups and stews should be chilled after cooking. The fat that forms on top after a few hours in the refrigerator should be skimmed off. When preparing meals, avoid using excess salt. Salt can contribute to raising blood pressure in some  people.  EATING AWAY FROM HOME Order entres, potatoes, and vegetables without sauces or butter. When meat exceeds the size of a deck of cards (3 to 4 ounces), the rest can be taken home for another meal. Choose vegetable or fruit salads and ask for low-calorie salad dressings to be served on the side. Use dressings sparingly. Limit high-fat toppings, such as bacon, crumbled eggs, cheese, sunflower seeds, and olives. Ask for heart-healthy tub margarine instead of butter.  High-Fiber Diet A high-fiber diet changes your normal diet to include more whole grains, legumes, fruits, and vegetables. Changes in the diet involve replacing refined carbohydrates with unrefined foods. The calorie level of the diet is essentially unchanged. The Dietary Reference Intake (recommended amount) for adult males is 38 grams per day. For adult females, it is 25 grams per day. Pregnant and lactating women should consume 28 grams of fiber per day. Fiber is the intact part of a plant that is not broken down during digestion. Functional fiber is fiber that has been isolated from the plant to provide a beneficial effect in the body. PURPOSE  Increase stool bulk.   Ease and regulate bowel movements.   Lower cholesterol.  INDICATIONS THAT YOU NEED  MORE FIBER  Constipation and hemorrhoids.   Uncomplicated diverticulosis (intestine condition) and irritable bowel syndrome.   Weight management.   As a protective measure against hardening of the arteries (atherosclerosis), diabetes, and cancer.   GUIDELINES FOR INCREASING FIBER IN THE DIET  Start adding fiber to the diet slowly. A gradual increase of about 5 more grams (2 slices of whole-wheat bread, 2 servings of most fruits or vegetables, or 1 bowl of high-fiber cereal) per day is best. Too rapid an increase in fiber may result in constipation, flatulence, and bloating.   Drink enough water and fluids to keep your urine clear or pale yellow. Water, juice, or  caffeine-free drinks are recommended. Not drinking enough fluid may cause constipation.   Eat a variety of high-fiber foods rather than one type of fiber.   Try to increase your intake of fiber through using high-fiber foods rather than fiber pills or supplements that contain small amounts of fiber.   The goal is to change the types of food eaten. Do not supplement your present diet with high-fiber foods, but replace foods in your present diet.  INCLUDE A VARIETY OF FIBER SOURCES  Replace refined and processed grains with whole grains, canned fruits with fresh fruits, and incorporate other fiber sources. White rice, white breads, and most bakery goods contain little or no fiber.   Brown whole-grain rice, buckwheat oats, and many fruits and vegetables are all good sources of fiber. These include: broccoli, Brussels sprouts, cabbage, cauliflower, beets, sweet potatoes, white potatoes (skin on), carrots, tomatoes, eggplant, squash, berries, fresh fruits, and dried fruits.   Cereals appear to be the richest source of fiber. Cereal fiber is found in whole grains and bran. Bran is the fiber-rich outer coat of cereal grain, which is largely removed in refining. In whole-grain cereals, the bran remains. In breakfast cereals, the largest amount of fiber is found in those with "bran" in their names. The fiber content is sometimes indicated on the label.   You may need to include additional fruits and vegetables each day.   In baking, for 1 cup white flour, you may use the following substitutions:   1 cup whole-wheat flour minus 2 tablespoons.   1/2 cup white flour plus 1/2 cup whole-wheat flour.

## 2013-06-02 NOTE — Addendum Note (Signed)
Addended by: [place] on: 06/02/2013 04:04 PM   Modules accepted: Level of Service

## 2013-06-02 NOTE — Progress Notes (Signed)
PLEASE CALL PT. Her abdominal films shows she has gas BUT NO OBSTRUCTION. AVOID DAIRY PRODUCTS AND ADD A PROBIOTIC DAILY(GENERIC [employer] BRAND, PH[employer]IPS [employer]ON HEALTH,OR ALIGN). ALIGN WILL BE MOST EXPENSIVE AND COSTS ABOUT $40/MO.

## 2013-06-02 NOTE — Progress Notes (Signed)
LMOM to call.

## 2013-06-03 ENCOUNTER — Encounter (HOSPITAL_COMMUNITY): Payer: Self-pay | Admitting: Pharmacy Technician

## 2013-06-03 NOTE — Progress Notes (Signed)
CC'd to PCP 

## 2013-06-03 NOTE — Progress Notes (Signed)
cc'd to pcp 

## 2013-06-04 ENCOUNTER — Emergency Department (HOSPITAL_COMMUNITY): Payer: Medicare Other

## 2013-06-04 ENCOUNTER — Encounter (HOSPITAL_COMMUNITY): Payer: Self-pay | Admitting: *Deleted

## 2013-06-04 ENCOUNTER — Emergency Department (HOSPITAL_COMMUNITY)
Admission: EM | Admit: 2013-06-04 | Discharge: 2013-06-04 | Disposition: A | Discharge status: Home / Self Care | Payer: Medicare Other | Attending: Emergency Medicine | Admitting: Emergency Medicine

## 2013-06-04 DIAGNOSIS — Z9889 Other specified postprocedural states: Secondary | ICD-10-CM | POA: Insufficient documentation

## 2013-06-04 DIAGNOSIS — Z9089 Acquired absence of other organs: Secondary | ICD-10-CM | POA: Insufficient documentation

## 2013-06-04 DIAGNOSIS — R112 Nausea with vomiting, unspecified: Secondary | ICD-10-CM | POA: Insufficient documentation

## 2013-06-04 DIAGNOSIS — Z8711 Personal history of peptic ulcer disease: Secondary | ICD-10-CM | POA: Insufficient documentation

## 2013-06-04 DIAGNOSIS — E7800 Pure hypercholesterolemia, unspecified: Secondary | ICD-10-CM | POA: Insufficient documentation

## 2013-06-04 DIAGNOSIS — Z8679 Personal history of other diseases of the circulatory system: Secondary | ICD-10-CM | POA: Insufficient documentation

## 2013-06-04 DIAGNOSIS — Z79899 Other long term (current) drug therapy: Secondary | ICD-10-CM | POA: Insufficient documentation

## 2013-06-04 DIAGNOSIS — Z8619 Personal history of other infectious and parasitic diseases: Secondary | ICD-10-CM | POA: Insufficient documentation

## 2013-06-04 DIAGNOSIS — I10 Essential (primary) hypertension: Secondary | ICD-10-CM | POA: Insufficient documentation

## 2013-06-04 DIAGNOSIS — Z90710 Acquired absence of both cervix and uterus: Secondary | ICD-10-CM | POA: Insufficient documentation

## 2013-06-04 DIAGNOSIS — Z8719 Personal history of other diseases of the digestive system: Secondary | ICD-10-CM | POA: Insufficient documentation

## 2013-06-04 DIAGNOSIS — Z8639 Personal history of other endocrine, nutritional and metabolic disease: Secondary | ICD-10-CM | POA: Insufficient documentation

## 2013-06-04 DIAGNOSIS — Z862 Personal history of diseases of the blood and blood-forming organs and certain disorders involving the immune mechanism: Secondary | ICD-10-CM | POA: Insufficient documentation

## 2013-06-04 DIAGNOSIS — Z7982 Long term (current) use of aspirin: Secondary | ICD-10-CM | POA: Insufficient documentation

## 2013-06-04 DIAGNOSIS — M129 Arthropathy, unspecified: Secondary | ICD-10-CM | POA: Insufficient documentation

## 2013-06-04 DIAGNOSIS — F17200 Nicotine dependence, unspecified, uncomplicated: Secondary | ICD-10-CM | POA: Insufficient documentation

## 2013-06-04 DIAGNOSIS — Z86718 Personal history of other venous thrombosis and embolism: Secondary | ICD-10-CM | POA: Insufficient documentation

## 2013-06-04 DIAGNOSIS — Z8673 Personal history of transient ischemic attack (TIA), and cerebral infarction without residual deficits: Secondary | ICD-10-CM | POA: Insufficient documentation

## 2013-06-04 DIAGNOSIS — R1013 Epigastric pain: Secondary | ICD-10-CM | POA: Insufficient documentation

## 2013-06-04 DIAGNOSIS — K219 Gastro-esophageal reflux disease without esophagitis: Secondary | ICD-10-CM | POA: Insufficient documentation

## 2013-06-04 DIAGNOSIS — R109 Unspecified abdominal pain: Secondary | ICD-10-CM

## 2013-06-04 LAB — COMPREHENSIVE METABOLIC PANEL
AST: 19 U/L (ref 0–37)
CO2: 28 mEq/L (ref 19–32)
Calcium: 9.9 mg/dL (ref 8.4–10.5)
Creatinine, Ser: 0.8 mg/dL (ref 0.50–1.10)
GFR calc Af Amer: 83 mL/min — ABNORMAL LOW (ref >90)
GFR calc non Af Amer: 71 mL/min — ABNORMAL LOW (ref >90)
Glucose, Bld: 97 mg/dL (ref 70–99)
Total Protein: 7.3 g/dL (ref 6.0–8.3)

## 2013-06-04 LAB — URINALYSIS, ROUTINE W REFLEX MICROSCOPIC
Glucose, UA: NEGATIVE mg/dL
Leukocytes, UA: NEGATIVE
Specific Gravity, Urine: <1.005 — ABNORMAL LOW (ref 1.005–1.030)
pH: 6 (ref 5.0–8.0)

## 2013-06-04 LAB — CBC WITH DIFFERENTIAL/PLATELET
HCT: 37.8 % (ref 36.0–46.0)
Hemoglobin: 12.3 g/dL (ref 12.0–15.0)
Lymphs Abs: 1.9 10*3/uL (ref 0.7–4.0)
Monocytes Relative: 7 % (ref 3–12)
Neutro Abs: 3.3 10*3/uL (ref 1.7–7.7)
Neutrophils Relative %: 58 % (ref 43–77)
RBC: 4.76 MIL/uL (ref 3.87–5.11)

## 2013-06-04 LAB — OCCULT BLOOD, POC DEVICE: Fecal Occult Bld: POSITIVE — AB

## 2013-06-04 MED ORDER — PANTOPRAZOLE SODIUM 40 MG IV SOLR
40.0000 mg | Freq: Once | INTRAVENOUS | Status: AC
  Administered 2013-06-04: 40 mg via INTRAVENOUS
  Filled 2013-06-04: qty 40

## 2013-06-04 MED ORDER — SODIUM CHLORIDE 0.9 % IV BOLUS (SEPSIS)
500.0000 mL | Freq: Once | INTRAVENOUS | Status: AC
  Administered 2013-06-04: 500 mL via INTRAVENOUS

## 2013-06-04 MED ORDER — IOHEXOL 300 MG/ML  SOLN
100.0000 mL | Freq: Once | INTRAMUSCULAR | Status: AC | PRN
  Administered 2013-06-04: 100 mL via INTRAVENOUS

## 2013-06-04 MED ORDER — OXYCODONE-ACETAMINOPHEN 5-325 MG PO TABS: 1.0000 | ORAL_TABLET | ORAL | Status: DC | PRN

## 2013-06-04 MED ORDER — IOHEXOL 300 MG/ML  SOLN
50.0000 mL | Freq: Once | INTRAMUSCULAR | Status: AC | PRN
  Administered 2013-06-04: 50 mL via ORAL

## 2013-06-04 MED ORDER — ONDANSETRON HCL 4 MG/2ML IJ SOLN
4.0000 mg | Freq: Once | INTRAMUSCULAR | Status: AC
  Administered 2013-06-04: 4 mg via INTRAVENOUS
  Filled 2013-06-04: qty 2

## 2013-06-04 MED ORDER — PROMETHAZINE HCL 25 MG PO TABS: 25.0000 mg | ORAL_TABLET | Freq: Four times a day (QID) | ORAL | Status: DC | PRN

## 2013-06-04 NOTE — ED Provider Notes (Signed)
CSN: [id number]     Arrival date & time 06/04/13  1353 History  This chart was scribed for Sharon Hutching, MD by Karle Plumber, ED Scribe. This patient was seen in room APA18/APA18 and the patient's care was started at 2:46 PM.  Chief Complaint  Patient presents with  . Abdominal Pain   The history is provided by the patient. No language interpreter was used.   HPI Comments:  [name] is a 72 y.o. female who presents to the Emergency Department complaining of intermittent, moderate epigastric abdominal pain for the past month. She reports having one episode of emesis this morning with intermittent nausea for the past month. She reports intermittent black stools that have been intermittent for the last month with the last occurrence this morning. She also states she feels bloated and has eaten less than usual today. Pt states previous doctor prescribed Carafate that provided mild relief until recently. She reports being prescribed Probiotic she has been taking without relief. Pt reports having an appt for upper endoscopy procedure next week. Pt has h/o cholecystectomy.   Past Medical History  Diagnosis Date  . Hypertension   . Paroxysmal SVT (supraventricular tachycardia)   . Hypercholesteremia   . GERD (gastroesophageal reflux disease)   . PUD (peptic ulcer disease)   . DVT (deep venous thrombosis) 2007  . Adrenal adenoma     left  . Thyroid nodule     left  . H. pylori infection   . Diverticulosis   . 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 Diarrhea     recurrent  . Arthritis   . Hiatal hernia    Past Surgical History  Procedure Laterality Date  . Total knee arthroplasty  bilateral knee  . Hip  replacement  left  . Appendectomy    . Cholecystectomy    . Abdominal hysterectomy    . Breast lumpectomy      left  . Ablation      of SVT  . Cardiac catheterization  2009    normal coronary arteries  . Colonoscopy  09/13/2009     YQM:[id number] internal hemorrhoids  . Esophagogastroduodenoscopy  09/13/2009    NUU:[id number]/   No family history on file. History  Substance Use Topics  . Smoking status: Current Some Day Smoker  . Smokeless tobacco: Not on file     Comment: Quit smoking x 3 weeks  . Alcohol Use: Yes     Comment: occasionally   OB History   Grav Para Term Preterm Abortions TAB SAB Ect Mult Living                 Review of Systems  Constitutional: Negative for fever.  Gastrointestinal: Positive for nausea, vomiting and abdominal pain.  All other systems reviewed and are negative.    Allergies  Review of patient's allergies indicates no known allergies.  Home Medications   Current Outpatient Rx  Name  Route  Sig  Dispense  Refill  . albuterol (PROVENTIL HFA;VENTOLIN HFA) 108 (90 BASE) MCG/ACT inhaler   Inhalation   Inhale 1-2 puffs into the lungs every 6 (six) hours as needed for wheezing.   1 Inhaler   0   . aspirin EC 81 MG tablet   Oral   Take 81 mg by mouth every morning.          Marland Kitchen CARAFATE 1 GM/10ML suspension   Oral   Take 1 g by mouth 2 (two)  times daily.          Marland Kitchen dexlansoprazole (DEXILANT) 60 MG capsule   Oral   Take 60 mg by mouth daily.         Marland Kitchen dicyclomine (BENTYL) 20 MG tablet   Oral   Take 1 tablet (20 mg total) by mouth every 6 (six) hours as needed (abdominal cramping).   15 tablet   0   . diltiazem (TIAZAC) 360 MG 24 hr capsule   Oral   Take 360 mg by mouth every morning.          . lovastatin (MEVACOR) 40 MG tablet   Oral   Take 2 tablets (80 mg total) by mouth at bedtime.   60 tablet   11   . metoprolol (TOPROL-XL) 50 MG 24 hr tablet   Oral   Take 50 mg by mouth every morning.          . pantoprazole (PROTONIX) 40 MG tablet   Oral   Take 40 mg by mouth 2 (two) times daily.           Marland Kitchen TAZTIA XT 360 MG 24 hr capsule      TAKE 1 CAPSULE BY MOUTH DAILY.   30 capsule   6     Generic ZOX:WRUEAV     360MG /24 Generic  WUJ:[id number] .Marland KitchenMarland Kitchen    Triage Vitals: BP 137/93  Pulse 82  Temp(Src) 98.2 F (36.8 C) (Oral)  Resp 20  Ht 5\' 6"  (1.676 m)  Wt 206 lb (93.441 kg)  BMI 33.27 kg/m2  SpO2 100% Physical Exam  Nursing note and vitals reviewed. Constitutional: She is oriented to person, place, and time. She appears well-developed and well-nourished.  HENT:  Head: Normocephalic and atraumatic.  Eyes: Conjunctivae and EOM are normal. Pupils are equal, round, and reactive to light.  Neck: Normal range of motion. Neck supple.  Cardiovascular: Normal rate, regular rhythm and normal heart sounds.   Pulmonary/Chest: Effort normal and breath sounds normal.  Abdominal: Soft. Bowel sounds are normal. There is tenderness.  Minimal epigastric tenderness.    Musculoskeletal: Normal range of motion.  Neurological: She is alert and oriented to person, place, and time.  Skin: Skin is warm and dry.  Psychiatric: She has a normal mood and affect.    ED Course  Procedures (including critical care time) DIAGNOSTIC STUDIES: Oxygen Saturation is 100% on RA, normal by my interpretation.   COORDINATION OF CARE: 2:52 PM-Patient advised of plan for treatment which includes labs, rectal exam, and CT of abdomen to which patient agrees.  Medications  pantoprazole (PROTONIX) injection 40 mg (not administered)  sodium chloride 0.9 % bolus 500 mL (not administered)  iohexol (OMNIPAQUE) 300 MG/ML solution 50 mL (50 mLs Oral Contrast Given 06/04/13 1536)    Labs Review Labs Reviewed  COMPREHENSIVE METABOLIC PANEL - Abnormal; Notable for the following:    GFR calc non Af Amer 71 (*)    GFR calc Af Amer 83 (*)    All other components within normal limits  CBC WITH DIFFERENTIAL - Abnormal; Notable for the following:    MCH 25.8 (*)    All other components within normal limits  URINALYSIS, ROUTINE W REFLEX MICROSCOPIC - Abnormal; Notable for the following:    Specific Gravity, Urine <1.005 (*)    All other components within  normal limits  OCCULT BLOOD, POC DEVICE - Abnormal; Notable for the following:    Fecal Occult Bld POSITIVE (*)    All other components within  normal limits  LIPASE, BLOOD   Imaging Review Dg Abd 2 Views  06/02/2013   *RADIOLOGY REPORT*  Clinical Data: Abdominal pain and bloating.  Dyspepsia.  ABDOMEN - 2 VIEW  Comparison: 09/30/2012.  Findings: Nonobstructive bowel gas pattern is present. Cholecystectomy clips are present in the right upper quadrant.  No dilated loops of large or small bowel.  Gaseous distention of the sigmoid colon.  Left total hip arthroplasty incidentally noted. Lumbar spondylosis and scoliosis.  IMPRESSION: No acute abnormality.  Nonobstructive bowel gas pattern. Cholecystectomy.   Original Report Authenticated By: Andreas Newport, M.D.  Ct Abdomen Pelvis W Contrast  06/04/2013   *RADIOLOGY REPORT*  Clinical Data: Epigastric pain.  CT ABDOMEN AND PELVIS WITH CONTRAST  Technique:  Multidetector CT imaging of the abdomen and pelvis was performed following the standard protocol during bolus administration of intravenous contrast.  Contrast: OMNIPAQUE IOHEXOL 300 MG/ML  SOLN  Comparison: CT abdomen and pelvis 09/30/2012.  Findings: The lung bases are clear.  No pleural or pericardial effusion.  The patient is status post cholecystectomy.  There is mild appearing fatty infiltration of the liver.  No focal liver lesion is identified.  The spleen, pancreas and right kidney appear normal. The patient has low attenuating adrenal lesions bilaterally, larger on the left, compatible with adenomas, unchanged.  There is an unchanged left renal cyst. There are some scattered colonic diverticula without diverticulitis.  The stomach and small bowel appear normal.  The appendix and uterus have been removed.  No lytic or sclerotic bony lesion is identified with lumbar degenerative change appearing worst at L3-4 noted.  IMPRESSION:  1.  No acute finding or finding to explain the patient's symptoms.  2.  Bilateral adrenal adenomas, unchanged. 3.  Mild diverticulosis without diverticulitis. 4.  Mild fatty infiltration of the liver.   Original Report Authenticated By: Holley Dexter, M.D.    MDM  No diagnosis found.  No acute abdomen.   Rectal exam positive for occult blood.   CT scan shows no acute findings.   Patient is on a PPI.   She has primary care followup.  Discharge meds Percocet and Phenergan 25 mg      I personally performed the services described in this documentation, which was scribed in my presence. The recorded information has been reviewed and is accurate.    Sharon Hutching, MD 06/04/13 2043

## 2013-06-04 NOTE — ED Notes (Signed)
Abdominal pain with nausea and vomiting, black tarry stools

## 2013-06-04 NOTE — ED Notes (Signed)
Requesting nausea medication. MD aware. Orders obtained.

## 2013-06-04 NOTE — ED Notes (Signed)
Request something to drink. Pt made aware she is unable to eat or drink until she is eval by EDP

## 2013-06-10 ENCOUNTER — Telehealth: Payer: Self-pay | Admitting: *Deleted

## 2013-06-10 NOTE — Telephone Encounter (Signed)
Called and informed pt.  

## 2013-06-10 NOTE — Telephone Encounter (Signed)
Pt called and said she is still hurting really bad. I asked her had she took meds, she said she had taken her regular meds, but not pain meds. I told her she might want to try the pain med, that is what it was given for, and if she doesn't get relief or worsens she should go to the ED.

## 2013-06-10 NOTE — Telephone Encounter (Signed)
PLEASE CALL PT. SHE SHOULD FOLLOW A FULL LIQUID DIET TODAY. SHE SHOULD GO TO TAKE HER PAIN MEDS AND USE PHENERGAN A NEEDED. SHE SHOULD COMPLETE HER EGD TOMORROW.

## 2013-06-10 NOTE — Telephone Encounter (Signed)
Pt called stating she is feeling bad, wanted to know if she needs to go to the ER, pt states she is having EGD tomorrow. Please advise [phone]

## 2013-06-10 NOTE — Telephone Encounter (Signed)
Called pt. She said her stomach is hurting really bad this morning , her entire abdomen. She said she went to ED a few days ago and they gave her Percocet and Phenergan. She has not taken any meds this AM because her stomach hurts so bad, and she hasn't been able to eat anything this AM. She had a crab salad and activia yesterday. Please advise!

## 2013-06-11 ENCOUNTER — Encounter (HOSPITAL_COMMUNITY): Payer: Self-pay | Admitting: *Deleted

## 2013-06-11 ENCOUNTER — Ambulatory Visit (HOSPITAL_COMMUNITY)
Admission: RE | Admit: 2013-06-11 | Discharge: 2013-06-11 | Disposition: A | Discharge status: Home / Self Care | Payer: Medicare Other | Source: Ambulatory Visit | Attending: Gastroenterology | Admitting: Gastroenterology

## 2013-06-11 ENCOUNTER — Encounter (HOSPITAL_COMMUNITY)
Admission: RE | Disposition: A | Discharge status: Home / Self Care | Payer: Self-pay | Source: Ambulatory Visit | Attending: Gastroenterology

## 2013-06-11 DIAGNOSIS — Z791 Long term (current) use of non-steroidal anti-inflammatories (NSAID): Secondary | ICD-10-CM

## 2013-06-11 DIAGNOSIS — K259 Gastric ulcer, unspecified as acute or chronic, without hemorrhage or perforation: Secondary | ICD-10-CM | POA: Insufficient documentation

## 2013-06-11 DIAGNOSIS — C169 Malignant neoplasm of stomach, unspecified: Secondary | ICD-10-CM | POA: Insufficient documentation

## 2013-06-11 DIAGNOSIS — K2970 Gastritis, unspecified, without bleeding: Secondary | ICD-10-CM

## 2013-06-11 DIAGNOSIS — I10 Essential (primary) hypertension: Secondary | ICD-10-CM | POA: Insufficient documentation

## 2013-06-11 DIAGNOSIS — K2990 Gastroduodenitis, unspecified, without bleeding: Secondary | ICD-10-CM

## 2013-06-11 DIAGNOSIS — R1013 Epigastric pain: Secondary | ICD-10-CM

## 2013-06-11 HISTORY — PX: ESOPHAGOGASTRODUODENOSCOPY: SHX5428

## 2013-06-11 SURGERY — EGD (ESOPHAGOGASTRODUODENOSCOPY)
Anesthesia: Moderate Sedation

## 2013-06-11 MED ORDER — SODIUM CHLORIDE 0.9 % IV SOLN
INTRAVENOUS | Status: DC
  Administered 2013-06-11: 10:00:00 via INTRAVENOUS

## 2013-06-11 MED ORDER — MIDAZOLAM HCL 5 MG/5ML IJ SOLN
INTRAMUSCULAR | Status: DC | PRN
  Administered 2013-06-11 (×2): 2 mg via INTRAVENOUS

## 2013-06-11 MED ORDER — MIDAZOLAM HCL 5 MG/5ML IJ SOLN
INTRAMUSCULAR | Status: AC
  Filled 2013-06-11: qty 10

## 2013-06-11 MED ORDER — MEPERIDINE HCL 100 MG/ML IJ SOLN
INTRAMUSCULAR | Status: DC | PRN
  Administered 2013-06-11 (×2): 25 mg via INTRAVENOUS

## 2013-06-11 MED ORDER — SIMETHICONE 40 MG/0.6ML PO SUSP
ORAL | Status: DC | PRN
  Administered 2013-06-11: 11:00:00

## 2013-06-11 MED ORDER — MEPERIDINE HCL 100 MG/ML IJ SOLN
INTRAMUSCULAR | Status: AC
  Filled 2013-06-11: qty 1

## 2013-06-11 NOTE — H&P (View-Only) (Signed)
Subjective:    Patient ID: [name], female    DOB: 05/17/1941, 72 y.o.   MRN: [id number]  HILL,GERALD K, MD  HPI LOST HER HUSBAND 20 YRS IN MAR 2014. BOWELS BEEN OUT WHACK SINCE BEFORE AND AFTER HE PASSES. CARAFATE DID NOT HELP. NOT TAKING A PROBIOTIC. IF SHE DRINKS WATER IT HURTS. MUST STRAIN TO BREAK WIND. BMs: EVERY AM AFTER AM CEREAL. MAY HAVE BM 1-2 TIMES THROUGHOUT THE DAY BUT NOT A WHOLE LOT. GETS COLD SOMETIME. TAKING BC POWDER FOR SHOULDER BLADE PAIN. HAD SEVERE BURNING IN HER STOMACH. DRINKING A WEIGHT LOSS. LIKES SPINACH AND HAD DARK STOOL. NO WATERY STOOLS. MAY HAVE #5 LATER ON IN THE DAY. STRAINS TO GET POO OUT. BREAKING WIND MAY BE DIFFICULT. CAN HAVE A BAD TASTE IN HER MOUTH: EVERY DAY AND IT'S LIKE THAT NOW. NO ABX IN 2014. CRAMPY ABD PAIN THAT'S PRETTY MUCH THERE ALL THE TIME. BETTER WITH FOOD. FEELS BLOATED ALL THE TIME. WAS EATING ACTIVIA AND STOPPED. STARTED SMOKING AGAIN.  PT DENIES FEVER, CHILLS, BRBPR, nausea, vomiting, melena, diarrhea, constipation, problems swallowing, OR heartburn or indigestion.  Past Medical History  Diagnosis Date  . Hypertension   . Paroxysmal SVT (supraventricular tachycardia)   . Hypercholesteremia   . GERD (gastroesophageal reflux disease)   . PUD (peptic ulcer disease)   . DVT (deep venous thrombosis) 2007  . Adrenal adenoma     left  . Thyroid nodule     left  . H. pylori infection   . Diverticulosis   . 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 Diarrhea     recurrent  . Arthritis   . Hiatal hernia    Past Surgical History  Procedure Laterality Date  . Total knee arthroplasty  bilateral knee  . Hip  replacement  left  . Appendectomy    . Cholecystectomy    . Abdominal hysterectomy    . Breast lumpectomy      left  . Ablation      of SVT  . Cardiac catheterization  2009    normal coronary arteries  . Colonoscopy  09/13/2009    SLF:diverticula/moderate internal hemorrhoids  .  Esophagogastroduodenoscopy  09/13/2009    SLF:normal/   No Known Allergies  Current Outpatient Prescriptions  Medication Sig Dispense Refill  . albuterol (PROVENTIL HFA;VENTOLIN HFA) 108 (90 BASE) MCG/ACT inhaler Inhale 1-2 puffs into the lungs every 6 (six) hours as needed for wheezing.    . aspirin EC 81 MG tablet Take 81 mg by mouth every morning.     . CARAFATE 1 GM/10ML suspension Take 1 g by mouth 2 (two) times daily.  DIDN'T HELP   .      . dicyclomine (BENTYL) 20 MG tablet BID    . diltiazem (TIAZAC) 360 MG 24 hr capsule Take 360 mg by mouth every morning.     . lovastatin (MEVACOR) 40 MG tablet Take 2 tablets (80 mg total) by mouth at bedtime.    . metoprolol (TOPROL-XL) 50 MG 24 hr tablet Take 50 mg by mouth every morning.     . pantoprazole (PROTONIX) 40 MG tablet Take 40 mg by mouth 2 (two) times daily.      . TAZTIA XT 360 MG 24 hr capsule TAKE 1 CAPSULE BY MOUTH DAILY.    . aspirin-sod bicarb-citric acid (ALKA-SELTZER) 325 MG TBEF Take 325 mg by mouth every 6 (six) hours as needed. NOT TAKING THAT   .   azithromycin (ZITHROMAX Z-PAK) 250 MG tablet One tab po QD (initial dose given in the ED)    . chlordiazePOXIDE (LIBRIUM) 10 MG capsule Take 10 mg by mouth at bedtime. For 15 days    .      .      . ondansetron (ZOFRAN) 4 MG tablet Take 1 tablet (4 mg total) by mouth every 8 (eight) hours as needed for nausea.    .       Review of Systems     Objective:   Physical Exam  Vitals reviewed. Constitutional: She is oriented to person, place, and time. She appears well-nourished. No distress.  HENT:  Head: Normocephalic and atraumatic.  Mouth/Throat: Oropharynx is clear and moist. No oropharyngeal exudate.  Eyes: Pupils are equal, round, and reactive to light. No scleral icterus.  Neck: Normal range of motion. Neck supple.  Cardiovascular: Normal rate, regular rhythm and normal heart sounds.   Pulmonary/Chest: Effort normal and breath sounds normal. No respiratory distress.   Abdominal: Soft. Bowel sounds are normal. She exhibits no distension. There is tenderness.  MILD TTP IN THE EPIGASTRIUM    Musculoskeletal: Normal range of motion. She exhibits no edema.  Lymphadenopathy:    She has no cervical adenopathy.  Neurological: She is alert and oriented to person, place, and time.  NO FOCAL DEFICITS   Psychiatric: She has a normal mood and affect.    History   Social History  . Marital Status: Married    Spouse Name: N/A    Number of Children: N/A  . Years of Education: N/A   Social History Main Topics  . Smoking status: Current Some Day Smoker  . Smokeless tobacco: Not on file     Comment: Quit smoking x 3 weeks  . Alcohol Use: Yes     Comment: occasionally  . Drug Use: No  .     Social History Narrative   HUSBAND PASSES MAR 2014. EATS OUT A LOT.           Assessment & Plan:   

## 2013-06-11 NOTE — Interval H&P Note (Signed)
History and Physical Interval Note:  06/11/2013 11:10 AM  [name]  has presented today for surgery, with the diagnosis of DYSPEPSIA  AND NSAID USE  The various methods of treatment have been discussed with the patient and family. After consideration of risks, benefits and other options for treatment, the patient has consented to  Procedure(s) with comments: ESOPHAGOGASTRODUODENOSCOPY (EGD) (N/A) - 2:15-moved to 1045 [name] notified pt as a surgical intervention .  The patient's history has been reviewed, patient examined, no change in status, stable for surgery.  I have reviewed the patient's chart and labs.  Questions were answered to the patient's satisfaction.     [employer]

## 2013-06-11 NOTE — Op Note (Signed)
[facility] [address] [place], [address]   ENDOSCOPY PROCEDURE REPORT  PATIENT: [name], [name]  MR#: [id number] BIRTHDATE: [birth year] , 72  yrs. old GENDER: Female  ENDOSCOPIST: Jonette Eva, MD REFERRED QI:ONGEXB Hill, M.D.  PROCEDURE DATE: 06/11/2013 PROCEDURE:   EGD w/ biopsy  INDICATIONS:Epigastric pain. I REVIWED CT AUG 29: MILDLY WALL THICKENED GASTRIC WALL MEDICATIONS: Demerol 50 mg IV, Versed, and Versed 4 mg IV TOPICAL ANESTHETIC:   Cetacaine Spray  DESCRIPTION OF PROCEDURE:     Physical exam was performed.  Informed consent was obtained from the patient after explaining the benefits, risks, and alternatives to the procedure.  The patient was connected to the monitor and placed in the left lateral position.  Continuous oxygen was provided by nasal cannula and IV medicine administered through an indwelling cannula.  After administration of sedation, the patients esophagus was intubated and the EG-[id number] ([id number])  endoscope was advanced under direct visualization to the second portion of the duodenum.  The scope was removed slowly by carefully examining the color, texture, anatomy, and integrity of the mucosa on the way out.  The patient was recovered in endoscopy and discharged home in satisfactory condition.   ESOPHAGUS: The mucosa of the esophagus appeared normal.   Large gastric ulcer beginning in teh cardia and extending to the body. biopsie sobtained in the periphery(6) and central(2) areas with jumbo forceps.   STOMACH: Mild non-erosive gastritis (inflammation) was found in the gastric antrum.  Multiple biopsies were performed. DUODENUM: The duodenal mucosa showed no abnormalities in the bulb and second portion of the duodenum. COMPLICATIONS:   None  ENDOSCOPIC IMPRESSION: 1.   ABDOMINAL PAIN DUE TO Large gastric ulcer  RECOMMENDATIONS: CONTINUE PROTONIX.  TAKE 30 MINUTES PRIOR TO MEALS TWICE DAILY. STRICTLY AVOID ASPIRIN, BC/GOODY  POWDERS, IBUPROFEN/MOTRIN, OR NAPROXEN/ALEVE. FOLLOW A LOW FAT DIET. BIOPSY WILL BE BACK IN 7 DAYS. FOLLOW UP IN DEC 2014.   REPEAT EXAM:   _______________________________ Rosalie DoctorJonette Eva, MD 06/11/2013 5:28 PM

## 2013-06-14 ENCOUNTER — Telehealth: Payer: Self-pay | Admitting: *Deleted

## 2013-06-14 NOTE — Telephone Encounter (Signed)
Called and told pt her biopsy result will be available in about 7 days.

## 2013-06-14 NOTE — Telephone Encounter (Signed)
Pt called to see if Dr. Darrick Penna needs follow up on her about her test, the EGD she had done Friday, pt wanted to know if she need to go back to Dr. Loleta Chance or come here, after her results. Please advise [phone]

## 2013-06-15 ENCOUNTER — Other Ambulatory Visit: Payer: Self-pay | Admitting: Gastroenterology

## 2013-06-15 ENCOUNTER — Telehealth: Payer: Self-pay

## 2013-06-15 ENCOUNTER — Telehealth: Payer: Self-pay | Admitting: Internal Medicine

## 2013-06-15 ENCOUNTER — Encounter (HOSPITAL_COMMUNITY): Payer: Self-pay | Admitting: Gastroenterology

## 2013-06-15 DIAGNOSIS — C169 Malignant neoplasm of stomach, unspecified: Secondary | ICD-10-CM

## 2013-06-15 NOTE — Telephone Encounter (Signed)
Dr. Jena Gauss,  Please advise in Dr. Lisbeth Renshaw absence: She is due back at the office tomorrow  Pt's path report from EGD done on 06/11/2013:  1. 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

## 2013-06-15 NOTE — Telephone Encounter (Signed)
Referral has been made to [facility] and they will contact [name] to schedule date & time

## 2013-06-15 NOTE — Telephone Encounter (Signed)
The pathologist called the office looking for Dr. Darrick Penna. 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 I called Dr. Loleta Chance. However, he is out until later in the week. I did discuss these results with Dr. Renaye Rakers.  I then called [name] and informed her that the biopsies of her stomach revealed cancer and she will need to be seen by the oncologist and possibly other specialists. Dr. Parke Simmers asked that she be seen by the folks at the cancer Center here in [place].   We'll expedite that referral.  Interestingly, I note that her stomach was interpreted to be normal on recent CT, however,  gastric wall was noted to be thickened upon Dr. Darrick Penna.  Further recommendations per Dr. Darrick Penna

## 2013-06-15 NOTE — Telephone Encounter (Signed)
See telephone note.

## 2013-06-16 ENCOUNTER — Emergency Department (HOSPITAL_COMMUNITY): Payer: Medicare Other

## 2013-06-16 ENCOUNTER — Encounter (HOSPITAL_COMMUNITY): Payer: Self-pay

## 2013-06-16 ENCOUNTER — Observation Stay (HOSPITAL_COMMUNITY)
Admission: EM | Admit: 2013-06-16 | Discharge: 2013-06-17 | Disposition: A | Discharge status: Home / Self Care | Payer: Medicare Other | Attending: Internal Medicine | Admitting: Internal Medicine

## 2013-06-16 DIAGNOSIS — K259 Gastric ulcer, unspecified as acute or chronic, without hemorrhage or perforation: Principal | ICD-10-CM | POA: Insufficient documentation

## 2013-06-16 DIAGNOSIS — E041 Nontoxic single thyroid nodule: Secondary | ICD-10-CM | POA: Insufficient documentation

## 2013-06-16 DIAGNOSIS — M129 Arthropathy, unspecified: Secondary | ICD-10-CM | POA: Insufficient documentation

## 2013-06-16 DIAGNOSIS — E785 Hyperlipidemia, unspecified: Secondary | ICD-10-CM | POA: Diagnosis present

## 2013-06-16 DIAGNOSIS — K3189 Other diseases of stomach and duodenum: Secondary | ICD-10-CM | POA: Insufficient documentation

## 2013-06-16 DIAGNOSIS — Z96659 Presence of unspecified artificial knee joint: Secondary | ICD-10-CM | POA: Insufficient documentation

## 2013-06-16 DIAGNOSIS — R079 Chest pain, unspecified: Secondary | ICD-10-CM | POA: Diagnosis present

## 2013-06-16 DIAGNOSIS — Z96649 Presence of unspecified artificial hip joint: Secondary | ICD-10-CM | POA: Insufficient documentation

## 2013-06-16 DIAGNOSIS — R634 Abnormal weight loss: Secondary | ICD-10-CM

## 2013-06-16 DIAGNOSIS — I517 Cardiomegaly: Secondary | ICD-10-CM

## 2013-06-16 DIAGNOSIS — R1013 Epigastric pain: Secondary | ICD-10-CM | POA: Diagnosis present

## 2013-06-16 DIAGNOSIS — K5730 Diverticulosis of large intestine without perforation or abscess without bleeding: Secondary | ICD-10-CM | POA: Insufficient documentation

## 2013-06-16 DIAGNOSIS — K219 Gastro-esophageal reflux disease without esophagitis: Secondary | ICD-10-CM | POA: Insufficient documentation

## 2013-06-16 DIAGNOSIS — Z87891 Personal history of nicotine dependence: Secondary | ICD-10-CM | POA: Insufficient documentation

## 2013-06-16 DIAGNOSIS — E663 Overweight: Secondary | ICD-10-CM | POA: Diagnosis present

## 2013-06-16 DIAGNOSIS — C169 Malignant neoplasm of stomach, unspecified: Secondary | ICD-10-CM | POA: Diagnosis present

## 2013-06-16 DIAGNOSIS — F17200 Nicotine dependence, unspecified, uncomplicated: Secondary | ICD-10-CM | POA: Diagnosis present

## 2013-06-16 DIAGNOSIS — I10 Essential (primary) hypertension: Secondary | ICD-10-CM | POA: Diagnosis present

## 2013-06-16 DIAGNOSIS — Z8711 Personal history of peptic ulcer disease: Secondary | ICD-10-CM

## 2013-06-16 DIAGNOSIS — Z86718 Personal history of other venous thrombosis and embolism: Secondary | ICD-10-CM | POA: Insufficient documentation

## 2013-06-16 DIAGNOSIS — E7800 Pure hypercholesterolemia, unspecified: Secondary | ICD-10-CM | POA: Insufficient documentation

## 2013-06-16 DIAGNOSIS — E876 Hypokalemia: Secondary | ICD-10-CM | POA: Insufficient documentation

## 2013-06-16 DIAGNOSIS — Z79899 Other long term (current) drug therapy: Secondary | ICD-10-CM | POA: Insufficient documentation

## 2013-06-16 LAB — CBC WITH DIFFERENTIAL/PLATELET
Basophils Absolute: 0 10*3/uL (ref 0.0–0.1)
HCT: 36.9 % (ref 36.0–46.0)
Lymphocytes Relative: 26 % (ref 12–46)
Monocytes Absolute: 0.6 10*3/uL (ref 0.1–1.0)
Neutro Abs: 4.8 10*3/uL (ref 1.7–7.7)
Platelets: 396 10*3/uL (ref 150–400)
RBC: 4.69 MIL/uL (ref 3.87–5.11)
RDW: 15 % (ref 11.5–15.5)
WBC: 7.4 10*3/uL (ref 4.0–10.5)

## 2013-06-16 LAB — BASIC METABOLIC PANEL
Chloride: 99 mEq/L (ref 96–112)
Creatinine, Ser: 0.82 mg/dL (ref 0.50–1.10)
GFR calc Af Amer: 80 mL/min — ABNORMAL LOW (ref >90)
Potassium: 3.4 mEq/L — ABNORMAL LOW (ref 3.5–5.1)
Sodium: 138 mEq/L (ref 135–145)

## 2013-06-16 LAB — TROPONIN I
Troponin I: <0.3 ng/mL (ref <0.30)
Troponin I: <0.3 ng/mL (ref <0.30)
Troponin I: <0.3 ng/mL (ref <0.30)

## 2013-06-16 MED ORDER — ALBUTEROL SULFATE HFA 108 (90 BASE) MCG/ACT IN AERS: 1.0000 | INHALATION_SPRAY | Freq: Four times a day (QID) | RESPIRATORY_TRACT | Status: DC | PRN

## 2013-06-16 MED ORDER — ALPRAZOLAM 0.25 MG PO TABS: 0.2500 mg | ORAL_TABLET | Freq: Every evening | ORAL | Status: DC | PRN

## 2013-06-16 MED ORDER — ENOXAPARIN SODIUM 40 MG/0.4ML ~~LOC~~ SOLN
40.0000 mg | SUBCUTANEOUS | Status: DC
  Administered 2013-06-16 – 2013-06-17 (×2): 40 mg via SUBCUTANEOUS
  Filled 2013-06-16: qty 0.4

## 2013-06-16 MED ORDER — SODIUM CHLORIDE 0.9 % IJ SOLN
3.0000 mL | Freq: Two times a day (BID) | INTRAMUSCULAR | Status: DC
  Administered 2013-06-16 – 2013-06-17 (×2): 3 mL via INTRAVENOUS

## 2013-06-16 MED ORDER — SIMVASTATIN 20 MG PO TABS: 40.0000 mg | ORAL_TABLET | Freq: Every day | ORAL | Status: DC

## 2013-06-16 MED ORDER — ONDANSETRON HCL 4 MG/2ML IJ SOLN
4.0000 mg | Freq: Once | INTRAMUSCULAR | Status: AC
  Administered 2013-06-16: 4 mg via INTRAVENOUS
  Filled 2013-06-16: qty 2

## 2013-06-16 MED ORDER — MORPHINE SULFATE 2 MG/ML IJ SOLN
2.0000 mg | INTRAMUSCULAR | Status: DC | PRN
  Administered 2013-06-16 (×4): 2 mg via INTRAVENOUS
  Filled 2013-06-16 (×4): qty 1

## 2013-06-16 MED ORDER — MORPHINE SULFATE 4 MG/ML IJ SOLN
4.0000 mg | Freq: Once | INTRAMUSCULAR | Status: AC
  Administered 2013-06-16: 4 mg via INTRAVENOUS
  Filled 2013-06-16: qty 1

## 2013-06-16 MED ORDER — OXYCODONE-ACETAMINOPHEN 5-325 MG PO TABS
1.0000 | ORAL_TABLET | ORAL | Status: DC | PRN
  Administered 2013-06-16 – 2013-06-17 (×5): 1 via ORAL
  Filled 2013-06-16 (×5): qty 1

## 2013-06-16 MED ORDER — METOPROLOL SUCCINATE ER 50 MG PO TB24
50.0000 mg | ORAL_TABLET | ORAL | Status: DC
  Administered 2013-06-16 – 2013-06-17 (×2): 50 mg via ORAL
  Filled 2013-06-16 (×2): qty 1

## 2013-06-16 MED ORDER — ONDANSETRON HCL 4 MG/2ML IJ SOLN
4.0000 mg | Freq: Four times a day (QID) | INTRAMUSCULAR | Status: DC | PRN
  Administered 2013-06-16 – 2013-06-17 (×3): 4 mg via INTRAVENOUS
  Filled 2013-06-16: qty 2

## 2013-06-16 MED ORDER — POTASSIUM CHLORIDE CRYS ER 20 MEQ PO TBCR
40.0000 meq | EXTENDED_RELEASE_TABLET | Freq: Once | ORAL | Status: AC
  Administered 2013-06-16: 40 meq via ORAL
  Filled 2013-06-16: qty 2

## 2013-06-16 MED ORDER — PANTOPRAZOLE SODIUM 40 MG IV SOLR
40.0000 mg | Freq: Two times a day (BID) | INTRAVENOUS | Status: DC
  Administered 2013-06-16 – 2013-06-17 (×3): 40 mg via INTRAVENOUS
  Filled 2013-06-16 (×2): qty 40

## 2013-06-16 MED ORDER — PROMETHAZINE HCL 12.5 MG PO TABS
25.0000 mg | ORAL_TABLET | Freq: Four times a day (QID) | ORAL | Status: DC | PRN
  Administered 2013-06-16 – 2013-06-17 (×4): 25 mg via ORAL
  Filled 2013-06-16: qty 2
  Filled 2013-06-16: qty 1
  Filled 2013-06-16: qty 2
  Filled 2013-06-16 (×3): qty 1

## 2013-06-16 MED ORDER — DILTIAZEM HCL ER COATED BEADS 180 MG PO CP24
360.0000 mg | ORAL_CAPSULE | ORAL | Status: DC
  Administered 2013-06-16 – 2013-06-17 (×2): 360 mg via ORAL
  Filled 2013-06-16 (×2): qty 2

## 2013-06-16 MED ORDER — DICYCLOMINE HCL 10 MG PO CAPS
20.0000 mg | ORAL_CAPSULE | Freq: Two times a day (BID) | ORAL | Status: DC
  Administered 2013-06-16 – 2013-06-17 (×2): 20 mg via ORAL
  Filled 2013-06-16 (×2): qty 2

## 2013-06-16 MED ORDER — ATORVASTATIN CALCIUM 20 MG PO TABS
20.0000 mg | ORAL_TABLET | Freq: Every day | ORAL | Status: DC
  Administered 2013-06-16: 20 mg via ORAL
  Filled 2013-06-16: qty 1

## 2013-06-16 MED ORDER — ONDANSETRON HCL 4 MG PO TABS: 4.0000 mg | ORAL_TABLET | Freq: Four times a day (QID) | ORAL | Status: DC | PRN

## 2013-06-16 MED ORDER — NITROGLYCERIN 0.4 MG SL SUBL
0.4000 mg | SUBLINGUAL_TABLET | SUBLINGUAL | Status: AC | PRN
  Administered 2013-06-16 (×3): 0.4 mg via SUBLINGUAL
  Filled 2013-06-16: qty 25

## 2013-06-16 MED ORDER — ONDANSETRON HCL 4 MG/2ML IJ SOLN
4.0000 mg | Freq: Four times a day (QID) | INTRAMUSCULAR | Status: DC | PRN
  Filled 2013-06-16 (×2): qty 2

## 2013-06-16 MED ORDER — MORPHINE SULFATE 2 MG/ML IJ SOLN
1.0000 mg | INTRAMUSCULAR | Status: DC | PRN
  Administered 2013-06-17: 1 mg via INTRAVENOUS
  Filled 2013-06-16: qty 1

## 2013-06-16 MED ORDER — SUCRALFATE 1 G PO TABS
1.0000 g | ORAL_TABLET | Freq: Three times a day (TID) | ORAL | Status: DC
  Administered 2013-06-16 – 2013-06-17 (×4): 1 g via ORAL
  Filled 2013-06-16 (×4): qty 1

## 2013-06-16 NOTE — ED Notes (Signed)
Having chest tightness started earlier tonight in the center of my chest. Also felt nauseated like I was going to vomit per pt.

## 2013-06-16 NOTE — H&P (Addendum)
[facility] History and Physical  [name]:[id number] DOB: 12-06-1940 DOA: 06/16/2013  Referring physician: ED PCP: Evlyn Courier, MD   Chief Complaint: Chest pain off and on for several weeks  HPI:  89 year or obese female with history of hypertension, dyslipidemia, active smoker who was recently seen by GI for ongoing abdominal pain and dark stools for which she had an EGD done recently showing large gastric ulcer. 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 She has been for 2 outpatient oncology. Patient reports that she has off and on pain in her epigastric area 6-8 weeks associated with nausea and the pain radiating to the substernal area. Denies radiation of the pain to her arms or jaw. Denies hematemesis or melena,  shortness of breath, headache, fever, chills, orthopnea, PND, vomiting, bowel or urinary symptoms. She reports that she has quit smoking recently and also quit drinking alcohol. Has not noticed any change in her weight but does have a poor appetite.  Review of Systems:  Constitutional: Denies fever, chills, diaphoresis, positive for appetite change and fatigue.  HEENT: Denies photophobia, eye pain, redness, hearing loss, ear pain, congestion, sore throat, rhinorrhea, sneezing, mouth sores, trouble swallowing, neck pain, neck stiffness and tinnitus.   Respiratory: Denies SOB, DOE, cough, chest tightness,  and wheezing.   Cardiovascular:  chest pain, denies palpitations and leg swelling.  Gastrointestinal: positive for for nausea,abdominal pain, Denies vomiting, diarrhea, constipation, blood in stool and abdominal distention.  Genitourinary: Denies dysuria, urgency, frequency, hematuria, flank pain and difficulty urinating.  Musculoskeletal: Denies myalgias, back pain, joint swelling, arthralgias and gait problem.  Skin: Denies pallor, rash and wound.  Neurological: Denies dizziness, seizures, syncope, weakness,  light-headedness, numbness and headaches.  Psychiatric/Behavioral: Denies  mood changes, confusion, nervousness, sleep disturbance and agitation   Past Medical History  Diagnosis Date  . Hypertension   . Paroxysmal SVT (supraventricular tachycardia)   . Hypercholesteremia   . GERD (gastroesophageal reflux disease)   . PUD (peptic ulcer disease)   . DVT (deep venous thrombosis) 2007  . Adrenal adenoma     left  . Thyroid nodule     left  . H. pylori infection   . Diverticulosis   . 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 Diarrhea     recurrent  . Arthritis   . Hiatal hernia    Past Surgical History  Procedure Laterality Date  . Total knee arthroplasty  bilateral knee  . Hip  replacement  left  . Appendectomy    . Cholecystectomy    . Abdominal hysterectomy    . Breast lumpectomy      left  . Ablation      of SVT  . Cardiac catheterization  2009    normal coronary arteries  . Colonoscopy  09/13/2009    WJX:[id number]/HYQMVHQI internal hemorrhoids  . Esophagogastroduodenoscopy  09/13/2009    ONG:EXBMWU/  . Esophagogastroduodenoscopy N/A 06/11/2013    Procedure: ESOPHAGOGASTRODUODENOSCOPY (EGD);  Surgeon: West Bali, MD;  Location: [facility];  Service: Endoscopy;  Laterality: N/A;  2:15-moved to 54 [name] notified pt   Social History:  reports that she has been smoking.  She does not have any smokeless tobacco history on file. She reports that  drinks alcohol. She reports that she does not use illicit drugs.  No Known Allergies  Family History  Problem Relation Age of Onset  . Colon cancer Neg Hx  Prior to Admission medications   Medication Sig Start Date End Date Taking? Authorizing Provider  albuterol (PROVENTIL HFA;VENTOLIN HFA) 108 (90 BASE) MCG/ACT inhaler Inhale 1-2 puffs into the lungs every 6 (six) hours as needed for wheezing. 10/05/12  Yes Richard Hyacinth Meeker, PA-C  ALPRAZolam Prudy Feeler) 0.25 MG tablet Take 0.25 mg  by mouth at bedtime as needed for sleep.   Yes Historical Provider, MD  dicyclomine (BENTYL) 20 MG tablet Take 20 mg by mouth 2 (two) times daily at 8 am and 10 pm.   Yes Historical Provider, MD  diltiazem (TIAZAC) 360 MG 24 hr capsule Take 360 mg by mouth every morning.  05/01/12  Yes Gaylord Shih, MD  lovastatin (MEVACOR) 40 MG tablet Take 2 tablets (80 mg total) by mouth at bedtime. 11/11/12  Yes Gaylord Shih, MD  metoprolol (TOPROL-XL) 50 MG 24 hr tablet Take 50 mg by mouth every morning.    Yes Historical Provider, MD  oxyCODONE-acetaminophen (PERCOCET) 5-325 MG per tablet Take 1 tablet by mouth every 4 (four) hours as needed for pain. 06/04/13  Yes [name], MD  pantoprazole (PROTONIX) 40 MG tablet Take 40 mg by mouth 2 (two) times daily.     Yes Historical Provider, MD  promethazine (PHENERGAN) 25 MG tablet Take 1 tablet (25 mg total) by mouth every 6 (six) hours as needed for nausea. 06/04/13  Yes [name], MD  Probiotic Product (PROBIOTIC FORMULA PO) Take 1 tablet by mouth every morning.    Historical Provider, MD    Physical Exam:  Filed Vitals:   06/16/13 0350 06/16/13 0400 06/16/13 0600 06/16/13 0617  BP: 119/77 118/67 135/73 156/99  Pulse: 95 95 75 71  Temp:    98.6 F (37 C)  TempSrc:    Oral  Resp: 19 17 17 18   Height:    5\' 6"  (1.676 m)  Weight:    92.7 kg (204 lb 5.9 oz)  SpO2: 97% 96% 94% 98%    Constitutional: Vital signs reviewed.  Patient is an elderly female lying in bed in no good distress Head: Normocephalic and atraumatic Mouth: no erythema or exudates, MMM Eyes: PERRL, EOMI, conjunctivae normal, No scleral icterus.  Neck: Supple, No mass, thyromegaly, or carotid bruit present.  Cardiovascular: RRR, S1 normal, S2 normal, no MRG, pulses symmetric and intact bilaterally Pulmonary/Chest: CTAB, no wheezes, rales, or rhonchi Abdominal: Soft, non-distended, bowel sounds are normal, tender to palpation over epigastric and left upper quadrant area , no masses,  organomegaly, or guarding present.  Musculoskeletal: No joint deformities, erythema, or stiffness, ROM full and no nontender Ext: no edema and no cyanosis Neurological: A&O x3, non focal  Labs on Admission:  Basic Metabolic Panel:  Recent Labs Lab 06/16/13 0240  NA 138  K 3.4*  CL 99  CO2 29  GLUCOSE 125*  BUN 6  CREATININE 0.82  CALCIUM 10.1   Liver Function Tests: No results found for this basename: AST, ALT, ALKPHOS, BILITOT, PROT, ALBUMIN,  in the last 168 hours No results found for this basename: LIPASE, AMYLASE,  in the last 168 hours No results found for this basename: AMMONIA,  in the last 168 hours CBC:  Recent Labs Lab 06/16/13 0240  WBC 7.4  NEUTROABS 4.8  HGB 12.2  HCT 36.9  MCV 78.7  PLT 396   Cardiac Enzymes:  Recent Labs Lab 06/16/13 0240  TROPONINI <0.30   BNP: No components found with this basename: POCBNP,  CBG: No results found for this basename: GLUCAP,  in the last 168 hours  Radiological Exams on Admission: Dg Chest Port 1 View  06/16/2013   *RADIOLOGY REPORT*  Clinical Data: Chest congestion.  Chest pain.  Smoker.  PORTABLE CHEST - 1 VIEW  Comparison: 10/05/2012  Findings: The heart size and pulmonary vascularity are normal. The lungs appear clear and expanded without focal air space disease or consolidation. No blunting of the costophrenic angles.  No pneumothorax.  Mediastinal contours appear intact.  No significant change since previous study.  IMPRESSION: No evidence of active pulmonary disease.   Original Report Authenticated By: Burman Nieves, M.D.    EKG: Normal Sinus rhythm, no ST-T changes  Assessment/Plan Principal Problem:   Chest pain Appears to be gastric in nature with underlying gastric ulcer and adenocarcinoma of the stomach seen recently. With cycle cardiac enzymes he did initial troponin and EKG unremarkable a pediatric a little pain a 2-D echo as well. She is on aspirin given her gastric ulcer. I would continue her  on when necessary morphine for pain. IV Protonix twice a day and carafate.  Active Problems:   Adenocarcinoma of stomach and gastric ulcer As seen on recent EGD with biopsy. She has been referred to outpatient oncology. I will get an oncology consult while she is here. Recent CT scan of the abdomen was unremarkable.  hb stable. Continue protonix and carafate.  Hypertension Resume home medications  Dyslipidemia Will place on simvastatin  Tobacco and alcohol use Patient reports that she has quit smoking and alcohol use since her recent EGD.  Hypokalemia Replenish KCl  Diet: Clear liquids DVT prophylaxis: Subcutaneous Lovenox  Code Status: Full code Family Communication: Son at bedside Disposition Plan: home Once stable  Eddie North [facility] Pager [phone]  If 7PM-7AM, please contact night-coverage [email or web address] Password [email or web address] 06/16/2013, 9:12 AM  Total time spent admission: 55 minutes

## 2013-06-16 NOTE — ED Notes (Signed)
MD at bedside. 

## 2013-06-16 NOTE — ED Provider Notes (Signed)
CSN: [id number]     Arrival date & time 06/16/13  0224 History   First MD Initiated Contact with Patient 06/16/13 [phone]     Chief Complaint  Patient presents with  . Chest Pain   (Consider location/radiation/quality/duration/timing/severity/associated sxs/prior Treatment) HPI.... tightness in anterior chest for approximately one week, worse earlier tonight with associated dyspnea, diaphoresis, nausea. Nothing makes symptoms better or worse. Severity is moderate. Cardiac risk factors include hypertension, hypercholesterolemia, cigarette smoking, obesity.   Recent diagnosis of gastric carcinoma via EGD  Past Medical History  Diagnosis Date  . Hypertension   . Paroxysmal SVT (supraventricular tachycardia)   . Hypercholesteremia   . GERD (gastroesophageal reflux disease)   . PUD (peptic ulcer disease)   . DVT (deep venous thrombosis) 2007  . Adrenal adenoma     left  . Thyroid nodule     left  . H. pylori infection   . Diverticulosis   . 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 Diarrhea     recurrent  . Arthritis   . Hiatal hernia    Past Surgical History  Procedure Laterality Date  . Total knee arthroplasty  bilateral knee  . Hip  replacement  left  . Appendectomy    . Cholecystectomy    . Abdominal hysterectomy    . Breast lumpectomy      left  . Ablation      of SVT  . Cardiac catheterization  2009    normal coronary arteries  . Colonoscopy  09/13/2009    JXB:[id number] internal hemorrhoids  . Esophagogastroduodenoscopy  09/13/2009    NGE:XBMWUX/  . Esophagogastroduodenoscopy N/A 06/11/2013    Procedure: ESOPHAGOGASTRODUODENOSCOPY (EGD);  Surgeon: West Bali, MD;  Location: [facility];  Service: Endoscopy;  Laterality: N/A;  2:15-moved to 1045 [name] notified pt   Family History  Problem Relation Age of Onset  . Colon cancer Neg Hx    History  Substance Use Topics  . Smoking status: Current Every Day Smoker --  0.25 packs/day for 15 years  . Smokeless tobacco: Not on file     Comment: Quit smoking x 3 weeks  . Alcohol Use: Yes     Comment: occasionally   OB History   Grav Para Term Preterm Abortions TAB SAB Ect Mult Living                 Review of Systems  All other systems reviewed and are negative.    Allergies  Review of patient's allergies indicates no known allergies.  Home Medications   Current Outpatient Rx  Name  Route  Sig  Dispense  Refill  . albuterol (PROVENTIL HFA;VENTOLIN HFA) 108 (90 BASE) MCG/ACT inhaler   Inhalation   Inhale 1-2 puffs into the lungs every 6 (six) hours as needed for wheezing.   1 Inhaler   0   . ALPRAZolam (XANAX) 0.25 MG tablet   Oral   Take 0.25 mg by mouth at bedtime as needed for sleep.         Marland Kitchen dicyclomine (BENTYL) 20 MG tablet   Oral   Take 20 mg by mouth 2 (two) times daily at 8 am and 10 pm.         . diltiazem (TIAZAC) 360 MG 24 hr capsule   Oral   Take 360 mg by mouth every morning.          . lovastatin (MEVACOR) 40 MG tablet   Oral  Take 2 tablets (80 mg total) by mouth at bedtime.   60 tablet   11   . metoprolol (TOPROL-XL) 50 MG 24 hr tablet   Oral   Take 50 mg by mouth every morning.          Marland Kitchen oxyCODONE-acetaminophen (PERCOCET) 5-325 MG per tablet   Oral   Take 1 tablet by mouth every 4 (four) hours as needed for pain.   20 tablet   0   . pantoprazole (PROTONIX) 40 MG tablet   Oral   Take 40 mg by mouth 2 (two) times daily.           . promethazine (PHENERGAN) 25 MG tablet   Oral   Take 1 tablet (25 mg total) by mouth every 6 (six) hours as needed for nausea.   20 tablet   0   . Probiotic Product (PROBIOTIC FORMULA PO)   Oral   Take 1 tablet by mouth every morning.          BP 150/112  Pulse 85  Temp(Src) 98.5 F (36.9 C) (Oral)  Resp 18  Ht 5\' 6"  (1.676 m)  Wt 203 lb (92.08 kg)  BMI 32.78 kg/m2  SpO2 100% Physical Exam  Nursing note and vitals reviewed. Constitutional: She is  oriented to person, place, and time. She appears well-developed and well-nourished.  HENT:  Head: Normocephalic and atraumatic.  Eyes: Conjunctivae and EOM are normal. Pupils are equal, round, and reactive to light.  Neck: Normal range of motion. Neck supple.  Cardiovascular: Normal rate, regular rhythm and normal heart sounds.   Pulmonary/Chest: Effort normal and breath sounds normal.  Abdominal: Soft. Bowel sounds are normal.  Musculoskeletal: Normal range of motion.  Neurological: She is alert and oriented to person, place, and time.  Skin: Skin is warm and dry.  Psychiatric: She has a normal mood and affect.    ED Course  Procedures (including critical care time) Labs Review Labs Reviewed  BASIC METABOLIC PANEL - Abnormal; Notable for the following:    Potassium 3.4 (*)    Glucose, Bld 125 (*)    GFR calc non Af Amer 69 (*)    GFR calc Af Amer 80 (*)    All other components within normal limits  CBC WITH DIFFERENTIAL  TROPONIN I  Dg Chest Port 1 View  06/16/2013   *RADIOLOGY REPORT*  Clinical Data: Chest congestion.  Chest pain.  Smoker.  PORTABLE CHEST - 1 VIEW  Comparison: 10/05/2012  Findings: The heart size and pulmonary vascularity are normal. The lungs appear clear and expanded without focal air space disease or consolidation. No blunting of the costophrenic angles.  No pneumothorax.  Mediastinal contours appear intact.  No significant change since previous study.  IMPRESSION: No evidence of active pulmonary disease.   Original Report Authenticated By: Burman Nieves, M.D.   Imaging Review No results found.  Date: 06/16/2013  Rate: 89  Rhythm: normal sinus rhythm  QRS Axis: normal  Intervals: normal  ST/T Wave abnormalities: normal  Conduction Disutrbances: none  Narrative Interpretation: unremarkable    MDM  No diagnosis found.  History suspicious for cardiac etiology.  Multiple risk factors.  EKG and Trop neg.  Nitroglycerin helps pain.  Did not give ASA  secondary to recent dx of gastric carcinoma   [name], MD 06/16/13 [phone]

## 2013-06-16 NOTE — Consult Note (Signed)
Patient History and Physical   [name] [id number] 08-Aug-1941 72 y.o. 06/16/2013  Referring MD: Eddie North, MD.  Reason for consultation: Gastric cancer  HPI:  [name] is a 72 year old woman who tell me that she has had abdominal pain for years which got worse over Sharon last 3 months.  Was on Carafate at some point with some relief. She also states that  her Stool has been black on and off over Sharon last 1 years. She denies coffea groung emesis.  She tell me that she has had about 10 ib in Sharon last 6 months  with some fatigue. She was noted on EGD done on 06/11/2013 for epigastric pain to have a large gastric ulcer. Pathology showed; 1. 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 2. 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 - NO INTESTINAL METAPLASIA, DYSPLASIA, OR MALIGNANCY IDENTIFIED. Repeat in 09/23/2009 showed partially healed gastric ulcer and biopsy was nondiagnostic for malignancy.  She was admitted her on 06/16/72 " chest pain on and off for several weeks" On close questioning she admitted to heartburn-like sensation in Sharon center of Sharon chest and upper abodominal pain and denies exertional and dyspnea or angina.  She did state that she has had on and off nausea.  She is not been able to tolerate orally well due to Sharon abdominal  'discomfort'.  She states that she is getting some relief from pain medications.  She had many family members her were visiting her in Sharon room.    PMH: Past Medical History  Diagnosis Date  . Hypertension   . Paroxysmal SVT (supraventricular tachycardia)   . Hypercholesteremia   . GERD (gastroesophageal reflux disease)   . PUD (peptic ulcer disease)   . DVT (deep venous  thrombosis) 2007  . Adrenal adenoma     left  . Thyroid nodule     left  . H. pylori infection   . Diverticulosis   . 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 Diarrhea     recurrent  . Arthritis   . Hiatal hernia     Past Surgical History  Procedure Laterality Date  . Total knee arthroplasty  bilateral knee  . Hip  replacement  left  . Appendectomy    . Cholecystectomy    . Abdominal hysterectomy    . Breast lumpectomy      left  . Ablation      of SVT  . Cardiac catheterization  2009    normal coronary arteries  . Colonoscopy  09/13/2009    WUJ:[id number] internal hemorrhoids  . Esophagogastroduodenoscopy  09/13/2009    HQI:ONGEXB/  . Esophagogastroduodenoscopy N/A 06/11/2013    Procedure: ESOPHAGOGASTRODUODENOSCOPY (EGD);  Surgeon: West Bali, MD;  Location: [facility];  Service: Endoscopy;  Laterality: N/A;  2:15-moved to 1045 [name] notified pt    Allergies: No Known Allergies  Medications: Current facility-administered medications:albuterol (PROVENTIL HFA;VENTOLIN HFA) 108 (90 BASE) MCG/ACT inhaler 1-2 puff, 1-2 puff, Inhalation, Q6H PRN, Nishant Dhungel, MD;  ALPRAZolam (XANAX) tablet 0.25 mg, 0.25 mg, Oral, QHS PRN, Nishant Dhungel, MD;  atorvastatin (LIPITOR) tablet 20 mg, 20 mg, Oral, q1800, Nishant Dhungel, MD;  dicyclomine (BENTYL) capsule 20 mg, 20 mg, Oral, BID AC & HS, Nishant Dhungel, MD diltiazem (CARDIZEM  CD) 24 hr capsule 360 mg, 360 mg, Oral, BH-q7a, Nishant Dhungel, MD, 360 mg at 06/16/13 0930;  enoxaparin (LOVENOX) injection 40 mg, 40 mg, Subcutaneous, Q24H, Nishant Dhungel, MD, 40 mg at 06/16/13 4098;  metoprolol succinate (TOPROL-XL) 24 hr tablet 50 mg, 50 mg, Oral, BH-q7a, Nishant Dhungel, MD, 50 mg at 06/16/13 0930;  morphine 2 MG/ML injection 1 mg, 1 mg, Intravenous, Q4H PRN, Nishant Dhungel, MD morphine 2 MG/ML injection 2 mg, 2 mg, Intravenous, Q4H PRN, Erick Blinks, MD, 2 mg at 06/16/13 1145;   ondansetron (ZOFRAN) injection 4 mg, 4 mg, Intravenous, Q6H PRN, Erick Blinks, MD;  ondansetron (ZOFRAN) injection 4 mg, 4 mg, Intravenous, Q6H PRN, Nishant Dhungel, MD, 4 mg at 06/16/13 1145;  ondansetron (ZOFRAN) tablet 4 mg, 4 mg, Oral, Q6H PRN, Nishant Dhungel, MD oxyCODONE-acetaminophen (PERCOCET/ROXICET) 5-325 MG per tablet 1 tablet, 1 tablet, Oral, Q4H PRN, Nishant Dhungel, MD, 1 tablet at 06/16/13 1354;  pantoprazole (PROTONIX) injection 40 mg, 40 mg, Intravenous, Q12H, Nishant Dhungel, MD, 40 mg at 06/16/13 0922;  promethazine (PHENERGAN) tablet 25 mg, 25 mg, Oral, Q6H PRN, Nishant Dhungel, MD, 25 mg at 06/16/13 0930;  sodium chloride 0.9 % injection 3 mL, 3 mL, Intravenous, Q12H, Nishant Dhungel, MD sucralfate (CARAFATE) tablet 1 g, 1 g, Oral, TID WC & HS, Nishant Dhungel, MD, 1 g at 06/16/13 1142   Social History:   reports that she has been smoking.  She does not have any smokeless tobacco history on file. She reports that  drinks alcohol. She reports that she does not use illicit drugs. 1 every 3 days. Been smoking for > 20 years.  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 Lives alone. Spouse died in 2013-01-02.  Family History: Family History  Problem Relation Age of Onset  . Colon cancer Neg Hx   Mother died form leukemia. Breast cancer in Sharon sister at 2.  Review of Systems: Occasional nauseas or vomiting.    Physical Exam: Blood pressure 123/66, pulse 56, temperature 98.3 F (36.8 C), temperature source Oral, resp. rate 18, height 5\' 6"  (1.676 m), weight 204 lb 5.9 oz (92.7 kg), SpO2 97.00%. GENERAL: No distress, obese.  SKIN:  No rashes or significant lesions , no ecchymosis or petechia or rash. HEAD: Normocephalic, No masses, lesions, tenderness or abnormalities  EYES: Conjunctiva are pink , non-injected and no jaundice. ENT: External ears normal ,lips, buccal mucosa, and tongue normal and mucous membranes are moist . LYMPH: No palpable lymphadenopathy,  In Sharon neck  supraclavicular area or axilla. LUNGS: Clear to auscultation , no crackles or wheezes HEART: regular rate & rhythm, no murmurs, no gallops, S1 normal and S2 normal  ABDOMEN: Abdomen soft, by obtaining Sharon left Sharon upper part of Sharon abdomen. no masses or organomegaly and no hepatosplenomegaly palpable.  EXTREMITIES: No edema, no skin discoloration or tenderness. NEURO: Alert & oriented , no focal motor deficits.     Lab Results: Lab Results  Component Value Date   WBC 7.4 06/16/2013   HGB 12.2 06/16/2013   HCT 36.9 06/16/2013   MCV 78.7 06/16/2013   PLT 396 06/16/2013   @LASTCHEMISTRY @    Radiological Studies: Ct Abdomen Pelvis W Contrast  06/04/2013   *RADIOLOGY REPORT*  Clinical Data: Epigastric pain.  CT ABDOMEN AND PELVIS WITH CONTRAST  Technique:  Multidetector CT imaging of Sharon abdomen and pelvis was performed following Sharon standard protocol during bolus administration of intravenous contrast.  Contrast: OMNIPAQUE IOHEXOL 300 MG/ML  SOLN  Comparison: CT abdomen  and pelvis 09/30/2012.  Findings: Sharon lung bases are clear.  No pleural or pericardial effusion.  Sharon patient is status post cholecystectomy.  There is mild appearing fatty infiltration of Sharon liver.  No focal liver lesion is identified.  Sharon spleen, pancreas and right kidney appear normal. Sharon patient has low attenuating adrenal lesions bilaterally, larger on Sharon left, compatible with adenomas, unchanged.  There is an unchanged left renal cyst. There are some scattered colonic diverticula without diverticulitis.  Sharon stomach and small bowel appear normal.  Sharon appendix and uterus have been removed.  No lytic or sclerotic bony lesion is identified with lumbar degenerative change appearing worst at L3-4 noted.  IMPRESSION:  1.  No acute finding or finding to explain Sharon patient's symptoms. 2.  Bilateral adrenal adenomas, unchanged. 3.  Mild diverticulosis without diverticulitis. 4.  Mild fatty infiltration of Sharon liver.    Original Report Authenticated By: Holley Dexter, M.D.   Dg Chest Port 1 View  06/16/2013   *RADIOLOGY REPORT*  Clinical Data: Chest congestion.  Chest pain.  Smoker.  PORTABLE CHEST - 1 VIEW  Comparison: 10/05/2012  Findings: Sharon heart size and pulmonary vascularity are normal. Sharon lungs appear clear and expanded without focal air space disease or consolidation. No blunting of Sharon costophrenic angles.  No pneumothorax.  Mediastinal contours appear intact.  No significant change since previous study.  IMPRESSION: No evidence of active pulmonary disease.   Original Report Authenticated By: Burman Nieves, M.D.   Dg Abd 2 Views  06/02/2013   *RADIOLOGY REPORT*  Clinical Data: Abdominal pain and bloating.  Dyspepsia.  ABDOMEN - 2 VIEW  Comparison: 09/30/2012.  Findings: Nonobstructive bowel gas pattern is present. Cholecystectomy clips are present in Sharon right upper quadrant.  No dilated loops of large or small bowel.  Gaseous distention of Sharon sigmoid colon.  Left total hip arthroplasty incidentally noted. Lumbar spondylosis and scoliosis.  IMPRESSION: No acute abnormality.  Nonobstructive bowel gas pattern. Cholecystectomy.   Original Report Authenticated By: Andreas Newport, M.D.      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  She will need staging workup.  I feel that Sharon 'chest pain ' is likely due to her gastric ulcer which is being managed medically at this time. I had a very long discussion with Sharon patient and her family, and we explained to them that treatment options will be discussed in detail after her staging workup is complete .  This could range from surgery alone for very early stage disease  To surgery plus chemotherapy/radiation for locally advanced disease and  palliative chemotherapy only for metastatic disease.  Recommendations: 1. We agree with medical management as determined appropriate by Dr. Gonzella Lex 2. We'll follow Sharon patient as an outpatient on  discharge. 3. We'll try and schedule her PET scan to be done next week hopefully see her in 2 weeks' time if she is discharged this week.     All questions were satisfactorily answered.She knows to call if she has any concern.  I spent 50% of Sharon time was spent counseling Sharon patient face to face. Sharon total time spent in Sharon appointment was 60 minutes.   Sherral Hammers, MD FACP. Hematology/Oncology.   Thank you Dr. Gonzella Lex for Sharon opportunity to be part of this patient's care.  Marland Kitchen

## 2013-06-16 NOTE — Progress Notes (Signed)
*  PRELIMINARY RESULTS* Echocardiogram 2D Echocardiogram has been performed.  Branna Cortina 06/16/2013, 10:24 AM

## 2013-06-16 NOTE — Care Management Note (Unsigned)
    Page 1 of 1   06/16/2013     1:41:49 PM   CARE MANAGEMENT NOTE 06/16/2013  Patient:  [name], [name]   Account Number:  [id number]  Date Initiated:  06/16/2013  Documentation initiated by:  Rosemary Holms  Subjective/Objective Assessment:   Pt states she lives alone. Son at pt's bedside. She fills poorly and CM will follow up later     Action/Plan:   Anticipated DC Date:     Anticipated DC Plan:           Choice offered to / List presented to:             Status of service:  In process, will continue to follow Medicare Important Message given?   (If response is "NO", the following Medicare IM given date fields will be blank) Date Medicare IM given:   Date Additional Medicare IM given:    Discharge Disposition:    Per UR Regulation:    If discussed at Long Length of Stay Meetings, dates discussed:    Comments:  06/16/13 Rosemary Holms RN BSN CM

## 2013-06-17 ENCOUNTER — Telehealth: Payer: Self-pay | Admitting: Gastroenterology

## 2013-06-17 ENCOUNTER — Other Ambulatory Visit (HOSPITAL_COMMUNITY): Payer: Self-pay | Admitting: Oncology

## 2013-06-17 DIAGNOSIS — C169 Malignant neoplasm of stomach, unspecified: Secondary | ICD-10-CM

## 2013-06-17 DIAGNOSIS — K3189 Other diseases of stomach and duodenum: Secondary | ICD-10-CM

## 2013-06-17 MED ORDER — OXYCODONE-ACETAMINOPHEN 5-325 MG PO TABS: 1.0000 | ORAL_TABLET | Freq: Four times a day (QID) | ORAL | Status: DC | PRN

## 2013-06-17 MED ORDER — SUCRALFATE 1 G PO TABS: 1.0000 g | ORAL_TABLET | Freq: Three times a day (TID) | ORAL | Status: DC

## 2013-06-17 MED ORDER — PROMETHAZINE HCL 25 MG PO TABS: 25.0000 mg | ORAL_TABLET | Freq: Four times a day (QID) | ORAL | Status: DC | PRN

## 2013-06-17 NOTE — Telephone Encounter (Signed)
REVIEWED.  

## 2013-06-17 NOTE — Discharge Summary (Signed)
Physician Discharge Summary  [name] HYQ:[id number] DOB: 09-Jul-1941 DOA: 06/16/2013  PCP: Evlyn Courier, MD  Admit date: 06/16/2013 Discharge date: 06/17/2013  Time spent: 40  minutes  Recommendations for Outpatient Follow-up:  1. Home with outpt PET scan on 06/28/2013 2. Follow up with PCP and oncology in 2 week  Discharge Diagnoses:  Principal Problem:   Chest pain likely secondary to PUD and gastric adenocarcinoma  Active Problems:  gastric ulcer   Adenocarcinoma of stomach   HTN   dyslipidemia   Dyspepsia   Discharge Condition: fair  Diet recommendation: regular  Filed Weights   06/16/13 0235 06/16/13 0617  Weight: 92.08 kg (203 lb) 92.7 kg (204 lb 5.9 oz)    History of present illness:  Please refer to admission H&P for details, but in brief, 72 year or obese female with history of hypertension, dyslipidemia, active smoker who was recently seen by GI for ongoing abdominal pain and dark stools for which she had an EGD done recently showing large gastric ulcer. 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 She was referred to  outpatient oncology. Patient reports that she has off and on pain in her epigastric area 6-8 weeks associated with nausea and the pain radiating to the substernal area. Denies radiation of the pain to her arms or jaw. Denies hematemesis or melena, shortness of breath, headache, fever, chills, orthopnea, PND, vomiting, bowel or urinary symptoms. She reports that she has quit smoking recently and also quit drinking alcohol. Has not noticed any change in her weight but does have a poor appetite.   Hospital Course:  Chest pain  Appears to be gastric in nature with underlying gastric ulcer and adenocarcinoma of the stomach seen recently. Serial troponins negative and EKG unremarkable. Placed on pain medications and bid IV PPI. Adde sucralfate. 2D echo was unremarkable.   Active Problems:  Adenocarcinoma of  stomach and gastric ulcer  As seen on recent EGD with biopsy. She has been referred to outpatient oncology. I will get an oncology consult while she is here. Recent CT scan of the abdomen was unremarkable.  hb stable. Continue protonix and carafate.  Pain currently improved. Seen by oncology and recommend PET scan as outpt and follow up with them.  Hypertension  Resume home medications    Tobacco and alcohol use  Patient reports that she has quit smoking and alcohol use since her recent EGD.   Hypokalemia  Replenished KCl      Consultations:  Oncology ( Dr Sharia Reeve)  Discharge Exam: Filed Vitals:   06/17/13 0541  BP: 105/71  Pulse: 59  Temp: 98.7 F (37.1 C)  Resp: 20     Patient is an elderly female lying in bed in no good distress  HEENT: no pallor, moist oral mucosa Cardiovascular: RRR, S1 normal, S2 normal, no MRG, pulses symmetric and intact bilaterally  Pulmonary/Chest: CTAB, no wheezes, rales, or rhonchi  Abdominal: Soft, non-distended, bowel sounds are normal, tender to palpation over epigastric and left upper quadrant area , no masses, organomegaly, or guarding present.  Ext: no edema  Neurological: A&O x3, non focal   Discharge Instructions   Future Appointments Provider Department Dept Phone   06/24/2013 9:00 AM [facility] Covering Provider [facility] [phone]   06/28/2013 10:00 AM [facility] [phone]   Pt should arrive15 minutes prior to scheduled appt time. Please inform patient that exam will take a minimum of 1  1/2 hours. Patient to be NPO 6 hours prior to exam  and should not take any insulin the day of exam.   06/30/2013 11:30 AM [facility] Covering Provider [facility] [phone]       Medication List         ALPRAZolam 0.25 MG tablet  Commonly known as:  XANAX  Take 0.25 mg by mouth at bedtime as needed for sleep.     dicyclomine 20 MG tablet  Commonly known as:   BENTYL  Take 20 mg by mouth 2 (two) times daily at 8 am and 10 pm.     diltiazem 360 MG 24 hr capsule  Commonly known as:  TIAZAC  Take 360 mg by mouth every morning.     fish oil-omega-3 fatty acids 1000 MG capsule  Take 1 g by mouth daily.     lovastatin 40 MG tablet  Commonly known as:  MEVACOR  Take 2 tablets (80 mg total) by mouth at bedtime.     metoprolol succinate 50 MG 24 hr tablet  Commonly known as:  TOPROL-XL  Take 50 mg by mouth every morning.     oxyCODONE-acetaminophen 5-325 MG per tablet  Commonly known as:  PERCOCET/ROXICET  Take 1 tablet by mouth every 6 (six) hours as needed for pain.     pantoprazole 40 MG tablet  Commonly known as:  PROTONIX  Take 40 mg by mouth 2 (two) times daily.     PROBIOTIC FORMULA PO  Take 1 tablet by mouth every morning.     promethazine 25 MG tablet  Commonly known as:  PHENERGAN  Take 1 tablet (25 mg total) by mouth every 6 (six) hours as needed for nausea.     sucralfate 1 G tablet  Commonly known as:  CARAFATE  Take 1 tablet (1 g total) by mouth 4 (four) times daily -  with meals and at bedtime.       No Known Allergies     Follow-up Information   Follow up with HILL,GERALD K, MD In 1 week.   Specialty:  Family Medicine   Contact information:   [address] [place] [address] [phone]       Follow up with Erline Hau, C, MD. Schedule an appointment as soon as possible for a visit in 1 week.   Specialty:  Hematology and Oncology   Contact information:   [address] [place] [address] [phone]        The results of significant diagnostics from this hospitalization (including imaging, microbiology, ancillary and laboratory) are listed below for reference.    Significant Diagnostic Studies: Ct Abdomen Pelvis W Contrast  06/04/2013   *RADIOLOGY REPORT*  Clinical Data: Epigastric pain.  CT ABDOMEN AND PELVIS WITH CONTRAST  Technique:  Multidetector CT imaging of the  abdomen and pelvis was performed following the standard protocol during bolus administration of intravenous contrast.  Contrast: OMNIPAQUE IOHEXOL 300 MG/ML  SOLN  Comparison: CT abdomen and pelvis 09/30/2012.  Findings: The lung bases are clear.  No pleural or pericardial effusion.  The patient is status post cholecystectomy.  There is mild appearing fatty infiltration of the liver.  No focal liver lesion is identified.  The spleen, pancreas and right kidney appear normal. The patient has low attenuating adrenal lesions bilaterally, larger on the left, compatible with adenomas, unchanged.  There is an unchanged left renal cyst. There are some scattered colonic diverticula without diverticulitis.  The stomach  and small bowel appear normal.  The appendix and uterus have been removed.  No lytic or sclerotic bony lesion is identified with lumbar degenerative change appearing worst at L3-4 noted.  IMPRESSION:  1.  No acute finding or finding to explain the patient's symptoms. 2.  Bilateral adrenal adenomas, unchanged. 3.  Mild diverticulosis without diverticulitis. 4.  Mild fatty infiltration of the liver.   Original Report Authenticated By: Holley Dexter, M.D.   Dg Chest Port 1 View  06/16/2013   *RADIOLOGY REPORT*  Clinical Data: Chest congestion.  Chest pain.  Smoker.  PORTABLE CHEST - 1 VIEW  Comparison: 10/05/2012  Findings: The heart size and pulmonary vascularity are normal. The lungs appear clear and expanded without focal air space disease or consolidation. No blunting of the costophrenic angles.  No pneumothorax.  Mediastinal contours appear intact.  No significant change since previous study.  IMPRESSION: No evidence of active pulmonary disease.   Original Report Authenticated By: Burman Nieves, M.D.   Dg Abd 2 Views  06/02/2013   *RADIOLOGY REPORT*  Clinical Data: Abdominal pain and bloating.  Dyspepsia.  ABDOMEN - 2 VIEW  Comparison: 09/30/2012.  Findings: Nonobstructive bowel gas pattern  is present. Cholecystectomy clips are present in the right upper quadrant.  No dilated loops of large or small bowel.  Gaseous distention of the sigmoid colon.  Left total hip arthroplasty incidentally noted. Lumbar spondylosis and scoliosis.  IMPRESSION: No acute abnormality.  Nonobstructive bowel gas pattern. Cholecystectomy.   Original Report Authenticated By: Andreas Newport, M.D.    Microbiology: No results found for this or any previous visit (from the past 240 hour(s)).   Labs: Basic Metabolic Panel:  Recent Labs Lab 06/16/13 0240  NA 138  K 3.4*  CL 99  CO2 29  GLUCOSE 125*  BUN 6  CREATININE 0.82  CALCIUM 10.1   Liver Function Tests: No results found for this basename: AST, ALT, ALKPHOS, BILITOT, PROT, ALBUMIN,  in the last 168 hours No results found for this basename: LIPASE, AMYLASE,  in the last 168 hours No results found for this basename: AMMONIA,  in the last 168 hours CBC:  Recent Labs Lab 06/16/13 0240  WBC 7.4  NEUTROABS 4.8  HGB 12.2  HCT 36.9  MCV 78.7  PLT 396   Cardiac Enzymes:  Recent Labs Lab 06/16/13 0240 06/16/13 0844 06/16/13 1431 06/16/13 2020  TROPONINI <0.30 <0.30 <0.30 <0.30   BNP: BNP (last 3 results) No results found for this basename: PROBNP,  in the last 8760 hours CBG: No results found for this basename: GLUCAP,  in the last 168 hours     Signed:  Johann Santone  [facility] 06/17/2013, 10:31 AM

## 2013-06-17 NOTE — Telephone Encounter (Signed)
I spoke to Sharon Coast, PA. We did not order PET scan so we will not try to move it up. Tom at the cancer center ordered it. I called [name] and informed family was requesting PET scan ASAP and she said it will not happen. So I told her I will have family call their office if they have further questions about this, and she said OK.   I returned the call to family, and pt answered the phone. She said she is scheduled for next week on the 18th and seemed to be ok with it. She said her son had called [place]. I told her to inform him if he has questions related to the appt for the PET scan he should call the cancer center, and she OK.

## 2013-06-17 NOTE — Telephone Encounter (Signed)
Family member called this afternoon asking for RMR. I told him RMR was not in the office and I would take a message. Family member said that patient was being scheduled for a PET scan regarding stomach cancer and he said he managed to get it moved up from 9/22 to 9/18. He wants the doctor to do something where patient can have this done ASAP if not today. He said it didn't matter where it was scheduled as long as it got scheduled sooner than 9/18 which is next week. After looking at the patient's chart it has the SF is her doctor, but the family member is requesting that RMR do something regarding this. Please advise and call [phone]

## 2013-06-17 NOTE — Telephone Encounter (Addendum)
REVIEWED.   CALLED PT TO DISCUSS. LVM-SHE MAY CALL ME AT [phone].

## 2013-06-17 NOTE — Telephone Encounter (Signed)
RMR gave pt results d/t SLF being on PAL that day. This is an SL[facility] pt. Routing to DS.

## 2013-06-24 ENCOUNTER — Encounter (HOSPITAL_COMMUNITY): Payer: Self-pay

## 2013-06-24 ENCOUNTER — Encounter (HOSPITAL_COMMUNITY)
Admission: RE | Admit: 2013-06-24 | Discharge: 2013-06-24 | Disposition: A | Discharge status: Home / Self Care | Payer: Medicare Other | Source: Ambulatory Visit | Attending: Oncology | Admitting: Oncology

## 2013-06-24 ENCOUNTER — Telehealth (HOSPITAL_COMMUNITY): Payer: Self-pay | Admitting: Hematology and Oncology

## 2013-06-24 ENCOUNTER — Encounter (HOSPITAL_COMMUNITY): Payer: Medicare Other | Attending: Hematology and Oncology

## 2013-06-24 VITALS — BP 116/76 | HR 107 | Temp 98.4°F | Resp 20 | Ht 66.0 in | Wt 198.0 lb

## 2013-06-24 DIAGNOSIS — D3501 Benign neoplasm of right adrenal gland: Secondary | ICD-10-CM

## 2013-06-24 DIAGNOSIS — C169 Malignant neoplasm of stomach, unspecified: Secondary | ICD-10-CM | POA: Insufficient documentation

## 2013-06-24 DIAGNOSIS — D3500 Benign neoplasm of unspecified adrenal gland: Secondary | ICD-10-CM | POA: Insufficient documentation

## 2013-06-24 MED ORDER — PROCHLORPERAZINE MALEATE 10 MG PO TABS: 10.0000 mg | ORAL_TABLET | Freq: Four times a day (QID) | ORAL | Status: DC

## 2013-06-24 MED ORDER — FLUDEOXYGLUCOSE F - 18 (FDG) INJECTION
19.3000 | Freq: Once | INTRAVENOUS | Status: AC | PRN
  Administered 2013-06-24: 19.3 via INTRAVENOUS

## 2013-06-24 MED ORDER — OXYCODONE HCL 5 MG PO TABS: 10.0000 mg | ORAL_TABLET | ORAL | Status: DC | PRN

## 2013-06-24 MED ORDER — OXYCODONE-ACETAMINOPHEN 5-325 MG PO TABS: 1.0000 | ORAL_TABLET | Freq: Four times a day (QID) | ORAL | Status: DC | PRN

## 2013-06-24 MED ORDER — METOCLOPRAMIDE HCL 10 MG PO TABS: 10.0000 mg | ORAL_TABLET | Freq: Four times a day (QID) | ORAL | Status: DC

## 2013-06-24 NOTE — Progress Notes (Signed)
UR Chart Review Completed  

## 2013-06-24 NOTE — Patient Instructions (Signed)
.  [facility] Discharge Instructions  RECOMMENDATIONS MADE BY THE CONSULTANT AND ANY TEST RESULTS WILL BE SENT TO YOUR REFERRING PHYSICIAN.  EXAM FINDINGS BY THE PHYSICIAN TODAY AND SIGNS OR SYMPTOMS TO REPORT TO CLINIC OR PRIMARY PHYSICIAN: Exam and findings as discussed by Dr. Zigmund Daniel.  MEDICATIONS PRESCRIBED:  Prescriptions given  INSTRUCTIONS/FOLLOW-UP: We will call you with pet scan report Will call you with appt with Dr. Donell Beers who will do port and stomach surgery See [place] on the 24th as scheduled. Thank you for choosing [facility] to provide your oncology and hematology care.  To afford each patient quality time with our providers, please arrive at least 15 minutes before your scheduled appointment time.  With your help, our goal is to use those 15 minutes to complete the necessary work-up to ensure our physicians have the information they need to help with your evaluation and healthcare recommendations.    Effective January 1st, 2014, we ask that you re-schedule your appointment with our physicians should you arrive 10 or more minutes late for your appointment.  We strive to give you quality time with our providers, and arriving late affects you and other patients whose appointments are after yours.    Again, thank you for choosing [facility].  Our hope is that these requests will decrease the amount of time that you wait before being seen by our physicians.       _____________________________________________________________  Should you have questions after your visit to [facility] [place], please contact our office at [phone] between the hours of 8:30 a.m. and 5:00 p.m.  Voicemails left after 4:30 p.m. will not be returned until the following business day.  For prescription refill requests, have your pharmacy contact our office with your prescription refill request.

## 2013-06-24 NOTE — Addendum Note (Signed)
Addended by: Corena Herter D on: 06/24/2013 01:44 PM   Modules accepted: Orders

## 2013-06-24 NOTE — Progress Notes (Signed)
[facility] NEW PATIENT EVALUATION   Name: [name] Date: 06/24/2013 MRN: [id number] DOB: September 27, 1941  PCP: Evlyn Courier, MD   REFERRING PHYSICIAN: Mirna Mires, MD CONSULTING: Jonette Eva, MD                             Marilynne Halsted, MD REASON FOR REFERRAL: Newly diagnosed gastric adenocarcinoma    HISTORY OF PRESENT ILLNESS:[name] is a 72 y.o. female who is sent for evaluation of recently diagnosed gastric carcinoma.  She been experiencing abdominal pain for the last 3 months and had an extensive workup which revealed a diagnosis of a gastric ulcer which was malignant. Pathologic evaluation revealed a poorly differentiated adenocarcinoma, HER-2/neu negative. She's had weight loss of approximately 30 pounds over the past 3 months with intermittent black stools and recent constipation due to opioid use. She's had abdominal discomfort as well with nausea without hematemesis. She also denies any lower tenderness swelling, cough, headache, skin rash, or significant fever but with night sweats. She is taking promethazine for nausea which is producing considerable drowsiness. She is here today for discussion of treatment plan.Marland Kitchen   PAST MEDICAL HISTORY:  has a past medical history of Hypertension; Paroxysmal SVT (supraventricular tachycardia); Hypercholesteremia; GERD (gastroesophageal reflux disease); PUD (peptic ulcer disease); DVT (deep venous thrombosis) (2007); Adrenal adenoma; Thyroid nodule; H. pylori infection; Diverticulosis; Recurrent abdominal pain; Diarrhea; Arthritis; and Hiatal hernia.     PAST SURGICAL HISTORY: Past Surgical History  Procedure Laterality Date  . Total knee arthroplasty  bilateral knee  . Hip  replacement  left  . Appendectomy    . Cholecystectomy    . Abdominal hysterectomy    . Breast lumpectomy      left  . Ablation      of SVT  . Cardiac catheterization  2009    normal coronary arteries  . Colonoscopy  09/13/2009   WUJ:[id number]/AOZHYQMV internal hemorrhoids  . Esophagogastroduodenoscopy  09/13/2009    HQI:ONGEXB/  . Esophagogastroduodenoscopy N/A 06/11/2013    Procedure: ESOPHAGOGASTRODUODENOSCOPY (EGD);  Surgeon: West Bali, MD;  [facility];  Service: Endoscopy;  Laterality: N/A;  2:15-moved to 1045 [name] notified pt     CURRENT MEDICATIONS: has a current medication list which includes the following prescription(s): alprazolam, dicyclomine, diltiazem, fish oil-omega-3 fatty acids, lovastatin, metoprolol succinate, oxycodone-acetaminophen, pantoprazole, probiotic product, promethazine, and sucralfate.   ALLERGIES: Review of patient's allergies indicates no known allergies.   SOCIAL HISTORY:  reports that she has been smoking.  She does not have any smokeless tobacco history on file. She reports that  drinks alcohol. She reports that she does not use illicit drugs.   FAMILY HISTORY: family history is negative for Colon cancer.   LABORATORY DATA:   Admission on 06/16/2013, Discharged on 06/17/2013  Component Date Value Range Status  . Sodium 06/16/2013 138  135 - 145 mEq/L Final  . Potassium 06/16/2013 3.4* 3.5 - 5.1 mEq/L Final  . Chloride 06/16/2013 99  96 - 112 mEq/L Final  . CO2 06/16/2013 29  19 - 32 mEq/L Final  . Glucose, Bld 06/16/2013 125* 70 - 99 mg/dL Final  . BUN 28/41/3244 6  6 - 23 mg/dL Final  . Creatinine, Ser 06/16/2013 0.82  0.50 - 1.10 mg/dL Final  . Calcium 10/09/7251 10.1  8.4 - 10.5 mg/dL Final  . GFR calc non Af Amer 06/16/2013 69* >90 mL/min Final  . GFR calc Af Denyse Dago 06/16/2013  80* >90 mL/min Final   Comment: (NOTE)                          The eGFR has been calculated using the CKD EPI equation.                          This calculation has not been validated in all clinical situations.                          eGFR's persistently <90 mL/min signify possible Chronic Kidney                          Disease.  . WBC 06/16/2013 7.4  4.0 - 10.5 K/uL  Final  . RBC 06/16/2013 4.69  3.87 - 5.11 MIL/uL Final  . Hemoglobin 06/16/2013 12.2  12.0 - 15.0 g/dL Final  . HCT 16/07/9603 36.9  36.0 - 46.0 % Final  . MCV 06/16/2013 78.7  78.0 - 100.0 fL Final  . MCH 06/16/2013 26.0  26.0 - 34.0 pg Final  . MCHC 06/16/2013 33.1  30.0 - 36.0 g/dL Final  . RDW 54/06/8118 15.0  11.5 - 15.5 % Final  . Platelets 06/16/2013 396  150 - 400 K/uL Final  . Neutrophils Relative % 06/16/2013 65  43 - 77 % Final  . Neutro Abs 06/16/2013 4.8  1.7 - 7.7 K/uL Final  . Lymphocytes Relative 06/16/2013 26  12 - 46 % Final  . Lymphs Abs 06/16/2013 1.9  0.7 - 4.0 K/uL Final  . Monocytes Relative 06/16/2013 8  3 - 12 % Final  . Monocytes Absolute 06/16/2013 0.6  0.1 - 1.0 K/uL Final  . Eosinophils Relative 06/16/2013 1  0 - 5 % Final  . Eosinophils Absolute 06/16/2013 0.1  0.0 - 0.7 K/uL Final  . Basophils Relative 06/16/2013 0  0 - 1 % Final  . Basophils Absolute 06/16/2013 0.0  0.0 - 0.1 K/uL Final  . Troponin I 06/16/2013 <0.30  <0.30 ng/mL Final   Comment:                                 Due to the release kinetics of cTnI,                          a negative result within the first hours                          of the onset of symptoms does not rule out                          myocardial infarction with certainty.                          If myocardial infarction is still suspected,                          repeat the test at appropriate intervals.  . Troponin I 06/16/2013 <0.30  <0.30 ng/mL Final   Comment:  Due to the release kinetics of cTnI,                          a negative result within the first hours                          of the onset of symptoms does not rule out                          myocardial infarction with certainty.                          If myocardial infarction is still suspected,                          repeat the test at appropriate intervals.  . Troponin I 06/16/2013 <0.30  <0.30 ng/mL Final    Comment:                                 Due to the release kinetics of cTnI,                          a negative result within the first hours                          of the onset of symptoms does not rule out                          myocardial infarction with certainty.                          If myocardial infarction is still suspected,                          repeat the test at appropriate intervals.  . Troponin I 06/16/2013 <0.30  <0.30 ng/mL Final   Comment:                                 Due to the release kinetics of cTnI,                          a negative result within the first hours                          of the onset of symptoms does not rule out                          myocardial infarction with certainty.                          If myocardial infarction is still suspected,                          repeat the test at appropriate intervals.     RADIOGRAPHY: Ct Abdomen Pelvis W Contrast  06/04/2013   *RADIOLOGY REPORT*  Clinical Data: Epigastric pain.  CT ABDOMEN AND PELVIS WITH CONTRAST  Technique:  Multidetector CT imaging of the abdomen and pelvis was performed following the standard protocol during bolus administration of intravenous contrast.  Contrast: OMNIPAQUE IOHEXOL 300 MG/ML  SOLN  Comparison: CT abdomen and pelvis 09/30/2012.  Findings: The lung bases are clear.  No pleural or pericardial effusion.  The patient is status post cholecystectomy.  There is mild appearing fatty infiltration of the liver.  No focal liver lesion is identified.  The spleen, pancreas and right kidney appear normal. The patient has low attenuating adrenal lesions bilaterally, larger on the left, compatible with adenomas, unchanged.  There is an unchanged left renal cyst. There are some scattered colonic diverticula without diverticulitis.  The stomach and small bowel appear normal.  The appendix and uterus have been removed.  No lytic or sclerotic bony lesion is identified with  lumbar degenerative change appearing worst at L3-4 noted.  IMPRESSION:  1.  No acute finding or finding to explain the patient's symptoms. 2.  Bilateral adrenal adenomas, unchanged. 3.  Mild diverticulosis without diverticulitis. 4.  Mild fatty infiltration of the liver.   Original Report Authenticated By: Holley Dexter, M.D.   Dg Chest Port 1 View  06/16/2013   *RADIOLOGY REPORT*  Clinical Data: Chest congestion.  Chest pain.  Smoker.  PORTABLE CHEST - 1 VIEW  Comparison: 10/05/2012  Findings: The heart size and pulmonary vascularity are normal. The lungs appear clear and expanded without focal air space disease or consolidation. No blunting of the costophrenic angles.  No pneumothorax.  Mediastinal contours appear intact.  No significant change since previous study.  IMPRESSION: No evidence of active pulmonary disease.   Original Report Authenticated By: Burman Nieves, M.D.   Dg Abd 2 Views  06/02/2013   *RADIOLOGY REPORT*  Clinical Data: Abdominal pain and bloating.  Dyspepsia.  ABDOMEN - 2 VIEW  Comparison: 09/30/2012.  Findings: Nonobstructive bowel gas pattern is present. Cholecystectomy clips are present in the right upper quadrant.  No dilated loops of large or small bowel.  Gaseous distention of the sigmoid colon.  Left total hip arthroplasty incidentally noted. Lumbar spondylosis and scoliosis.  IMPRESSION: No acute abnormality.  Nonobstructive bowel gas pattern. Cholecystectomy.   Original Report Authenticated By: Andreas Newport, M.D.        REVIEW OF SYSTEMS:  Constitutional: Denies fevers, chills or abnormal weight loss Eyes: Denies blurriness of vision Ears, nose, mouth, throat, and face: Denies mucositis or sore throat Respiratory: Denies cough, dyspnea or wheezes Cardiovascular: Denies palpitation, chest discomfort or lower extremity swelling Gastrointestinal:  See history of present illness Skin: Denies abnormal skin rashes Lymphatics: Denies new lymphadenopathy or easy  bruising Neurological:Denies numbness, tingling or new weaknesses Behavioral/Psych: Mood is stable, no new changes  All other systems were reviewed with the patient and are negative.  PHYSICAL EXAM:  height is 5\' 6"  (1.676 m) and weight is 198 lb (89.812 kg). Her oral temperature is 98.4 F (36.9 C). Her blood pressure is 116/76 and her pulse is 107. Her respiration is 20.    GENERAL:alert, no distress and comfortable the morbidly obese. SKIN: skin color, texture, turgor are normal, no rashes or significant lesions EYES: normal, Conjunctiva are pink and non-injected, sclera clear OROPHARYNX:no exudate, no erythema and lips, buccal mucosa, and tongue normal  NECK: supple, thyroid normal size, non-tender, without nodularity LYMPH:  no palpable lymphadenopathy in the cervical, axillary or inguinal LUNGS: clear to auscultation and percussion with normal breathing effort  HEART: regular rate & rhythm and no murmurs and no lower extremity edema ABDOMEN:abdomen soft, non-tender and normal bowel sounds Musculoskeletal:no cyanosis of digits and no clubbing  NEURO: alert & oriented x 3 with fluent speech, no focal motor/sensory deficits. No asterixis   IMPRESSION: #1. Poorly differentiated adenocarcinoma of the stomach with a normal CT scan. #2. Persistent nausea with gastroparesis. #3. Hypertension, controlled. #4. Diverticulosis, quiescent. #5 fatty liver. #6. Left adrenal adenoma #7. History of H. pylori gastritis in the past. #8. Degenerative joint disease, status post bilateral knee replacements and left hip replacement. #9 thyroid nodule, cystic. #10. Gastroesophageal reflux disease.  PLAN:  #1. PET/CT scan ordered for later today at [facility]. #2. Surgical consult with Dr. Donell Beers for consideration of primary resection and for insertion of MediPort. #3. Followup on 06/30/2013 for discussion of treatment strategy. Alternatives would include preoperative chemotherapy,  preoperative chemoradiotherapy, or primary resection. #4. Prescriptions were given for metoclopramide 10 mg 4 times a day along with prochlorperazine 10 mg 4 times a day before meals and at bedtime along with oxycodone 10 mg up to every 2-4 hours as needed for pain. #5. The patient was accompanied by a close friend who is in agreement with this strategy as is the patient.   Maurilio Lovely, MD 06/24/2013 9:09 AM

## 2013-06-24 NOTE — Telephone Encounter (Signed)
Patient notified of appt with Dr. Donell Beers at [facility] at on 9/22 at 0815 am.

## 2013-06-25 ENCOUNTER — Telehealth (HOSPITAL_COMMUNITY): Payer: Self-pay | Admitting: Hematology and Oncology

## 2013-06-25 NOTE — Telephone Encounter (Signed)
Spoke with patient at 8:40 AM and discussed the results of her PET scan. She has an appointment with Dr. Donell Beers on 06/28/2013 she was told that laparoscopy may be involved in her staging. MediPort will also be inserted. The decision will be made as to whether or not to proceed to definitive surgery primarily are to treat first with neoadjuvant chemotherapy. The patient less than understanding of this strategy.

## 2013-06-28 ENCOUNTER — Ambulatory Visit (HOSPITAL_COMMUNITY): Payer: Medicare Other

## 2013-06-28 ENCOUNTER — Other Ambulatory Visit (INDEPENDENT_AMBULATORY_CARE_PROVIDER_SITE_OTHER): Payer: Self-pay | Admitting: General Surgery

## 2013-06-28 ENCOUNTER — Encounter (INDEPENDENT_AMBULATORY_CARE_PROVIDER_SITE_OTHER): Payer: Self-pay | Admitting: General Surgery

## 2013-06-28 ENCOUNTER — Telehealth (INDEPENDENT_AMBULATORY_CARE_PROVIDER_SITE_OTHER): Payer: Self-pay

## 2013-06-28 ENCOUNTER — Encounter (HOSPITAL_COMMUNITY): Payer: Medicare Other

## 2013-06-28 ENCOUNTER — Ambulatory Visit (INDEPENDENT_AMBULATORY_CARE_PROVIDER_SITE_OTHER): Payer: Medicare Other | Admitting: General Surgery

## 2013-06-28 ENCOUNTER — Telehealth (HOSPITAL_COMMUNITY): Payer: Self-pay | Admitting: Dietician

## 2013-06-28 VITALS — BP 134/80 | HR 92 | Temp 97.4°F | Resp 14 | Ht 66.0 in | Wt 194.6 lb

## 2013-06-28 DIAGNOSIS — C169 Malignant neoplasm of stomach, unspecified: Secondary | ICD-10-CM

## 2013-06-28 DIAGNOSIS — C801 Malignant (primary) neoplasm, unspecified: Secondary | ICD-10-CM

## 2013-06-28 NOTE — Progress Notes (Signed)
Chief Complaint  Patient presents with  . New Evaluation    ev newly dx adenocarcinoma    HISTORY: Patient is a 72 year old female who presents with malaise, weight loss, decreased appetite, and left upper quadrant abdominal pain.  She has lost around 30 pounds. She describes her weight loss as a result of multiple factors. She states that her appetite is decreased, her taste buds are not functioning properly, and she has pain and feels bad after she eats.    Past Medical History  Diagnosis Date  . Hypertension   . Paroxysmal SVT (supraventricular tachycardia)   . Hypercholesteremia   . GERD (gastroesophageal reflux disease)   . PUD (peptic ulcer disease)   . DVT (deep venous thrombosis) 2007  . Adrenal adenoma     left  . Thyroid nodule     left  . H. pylori infection   . Diverticulosis   . 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 Diarrhea     recurrent  . Arthritis   . Hiatal hernia   . Cancer     adenocarcinoma    Past Surgical History  Procedure Laterality Date  . Total knee arthroplasty  bilateral knee  . Hip  replacement  left  . Appendectomy    . Cholecystectomy    . Abdominal hysterectomy    . Breast lumpectomy      left  . Ablation      of SVT  . Cardiac catheterization  2009    normal coronary arteries  . Colonoscopy  09/13/2009    WGN:[id number]/IONGEXBM internal hemorrhoids  . Esophagogastroduodenoscopy  09/13/2009    WUX:LKGMWN/  . Esophagogastroduodenoscopy N/A 06/11/2013    Procedure: ESOPHAGOGASTRODUODENOSCOPY (EGD);  Surgeon: West Bali, MD;  Location: [facility];  Service: Endoscopy;  Laterality: N/A;  2:15-moved to 1045 [name] notified pt    Current Outpatient Prescriptions  Medication Sig Dispense Refill  . ALPRAZolam (XANAX) 0.25 MG tablet Take 0.25 mg by mouth at bedtime as needed for sleep.      Marland Kitchen dicyclomine (BENTYL) 20 MG tablet Take 20 mg by mouth 2 (two) times daily at 8 am and 10 pm.      .  diltiazem (TIAZAC) 360 MG 24 hr capsule Take 360 mg by mouth every morning.       . fish oil-omega-3 fatty acids 1000 MG capsule Take 1 g by mouth daily.      . Linaclotide (LINZESS) 145 MCG CAPS capsule Take 145 mcg by mouth daily.      Marland Kitchen lovastatin (MEVACOR) 40 MG tablet Take 2 tablets (80 mg total) by mouth at bedtime.  60 tablet  11  . metoCLOPramide (REGLAN) 10 MG tablet Take 1 tablet (10 mg total) by mouth 4 (four) times daily.  100 tablet  3  . metoprolol (TOPROL-XL) 50 MG 24 hr tablet Take 50 mg by mouth every morning.       Marland Kitchen oxyCODONE (OXY IR/ROXICODONE) 5 MG immediate release tablet Take 2 tablets (10 mg total) by mouth every 2 (two) hours as needed for pain.  120 tablet  0  . pantoprazole (PROTONIX) 40 MG tablet Take 40 mg by mouth 2 (two) times daily.        . Probiotic Product (PROBIOTIC FORMULA PO) Take 1 tablet by mouth every morning.      . prochlorperazine (COMPAZINE) 10 MG tablet Take 1 tablet (10 mg total) by mouth every 6 (six) hours.  100 tablet  3  . sucralfate (CARAFATE) 1 G tablet Take 1 tablet (1 g total) by mouth 4 (four) times daily -  with meals and at bedtime.  60 tablet  2   No current facility-administered medications for this visit.     No Known Allergies   Family History  Problem Relation Age of Onset  . Colon cancer Neg Hx   . Cancer Mother     leukemia  . Cancer Sister     breast  . Heart disease Brother   . Heart disease Sister      History   Social History  . Marital Status: Widowed    Spouse Name: N/A    Number of Children: N/A  . Years of Education: N/A   Social History Main Topics  . Smoking status: Current Every Day Smoker -- 0.25 packs/day for 15 years  . Smokeless tobacco: Never Used     Comment: Quit smoking x 3 weeks  . Alcohol Use: Yes     Comment: occasionally  . Drug Use: No  . Sexual Activity: None   Other Topics Concern  . None   Social History Narrative   HUSBAND PASSES MAR 2014. EATS OUT A LOT.     REVIEW OF  SYSTEMS - PERTINENT POSITIVES ONLY: 12 point review of systems negative other than HPI and PMH except for chills, weight loss, nasal congestion, cough, chest pain, palpitations, abdominal pain and distention, vomiting, headaches, weakness.  EXAM: Filed Vitals:   06/28/13 0835  BP: 134/80  Pulse: 92  Temp: 97.4 F (36.3 C)  Resp: 14   Filed Weights   06/28/13 0835  Weight: 194 lb 9.6 oz (88.27 kg)     Gen:  No acute distress.  Well nourished and well groomed.   Neurological: Alert and oriented to person, place, and time. Coordination normal.  Head: Normocephalic and atraumatic.  Eyes: Conjunctivae are normal. Pupils are equal, round, and reactive to light. No scleral icterus.  Neck: Normal range of motion. Neck supple. No tracheal deviation or thyromegaly present.  Cardiovascular: Normal rate, regular rhythm, normal heart sounds and intact distal pulses.  Exam reveals no gallop and no friction rub.  No murmur heard. Respiratory: Effort normal.  No respiratory distress. No chest wall tenderness. Breath sounds normal.  No wheezes, rales or rhonchi.  GI: Soft. Bowel sounds are normal. The abdomen is soft.  It is mildly distended.  She has significant LUQ tenderness.   There is no rebound and no guarding.  Musculoskeletal: Normal range of motion. Extremities are nontender.  Lymphadenopathy: No cervical, preauricular, postauricular or axillary adenopathy is present Skin: Skin is warm and dry. No rash noted. No diaphoresis. No erythema. No pallor. No clubbing, cyanosis, or edema.   Psychiatric: Normal mood and affect. Behavior is normal. Judgment and thought content normal.    LABORATORY RESULTS: Available labs are reviewed  Mild hypokalemia, mild hyperglycemia.  Cr. 0.82, HCT 36.9, other labs essentially normal.   Pathology 1. 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 2. Stomach, biopsy, gastric ulcer - INVASIVE POORLY  DIFFERENTIATED ADENOCARCINOMA WITH SIGNET RING CELL FEATURES.  RADIOLOGY RESULTS: See E-Chart or I-Site for most recent results.  Images and reports are reviewed. PET IMPRESSION:  Abnormal F D G uptake associated with concentric mass-like  thickening of the gastric cardia/greater curvature, compatible with  the given diagnosis of adenocarcinoma. No FDG avid metastatic  disease within the neck, chest, abdomen, or pelvis.    ASSESSMENT AND  PLAN: Adenocarcinoma of stomach Patient is a 72 year old female with new diagnosis of gastric cancer. This appears to be a fairly sizable mass based on the imaging available from her endoscopy.  Given the size and the proximity to the GE junction, I recommend neoadjuvant treatment prior to surgery.  Also, given the size of the tumor she is increased likelihood of micrometastatic disease.   She also requires referral to nutrition. She has lost 30 pounds and needs to increase her protein stores prior to proximal versus total gastrectomy.  I stressed to the patient that doing surgery does not make her immediately for better in terms of her pain and issues with appetite and nausea. There is a significant recovery period rom surgery and this can really inhibit proceeding with chemotherapy afterwards.  I briefly discussed anatomy and surgical options. I also discussed that if metastatic disease was located, that she would not be a surgical candidate in any event.  I have sent a message to her oncologist at [facility] regarding this plan. I will see her back toward the end of chemotherapy to schedule surgery.  30 min spent in counseling.       Maudry Diego MD Surgical Oncology, General and Endocrine Surgery [facility]      Visit Diagnoses: 1. Adenocarcinoma of stomach     Primary Care Physician: Evlyn Courier, MD

## 2013-06-28 NOTE — Telephone Encounter (Signed)
Received message from [place] at [facility] left at [phone].

## 2013-06-28 NOTE — Patient Instructions (Signed)
Go see dietitian at [facility]    Recommend chemo first prior to surgery.  We need you to try to maintain your weight.   Add Ensure or ensure plus 4 cans per day.    Eat protein!!!  I will see you back in several months.

## 2013-06-28 NOTE — Telephone Encounter (Signed)
Called back at 1051. Pt needs nutrition consult.

## 2013-06-28 NOTE — Telephone Encounter (Signed)
LMOV for nutritionist at [facility] to call [place] back.

## 2013-06-28 NOTE — Telephone Encounter (Signed)
Called pt at 1529. Appointment scheduled for 07/06/13 at 0900.

## 2013-06-28 NOTE — Assessment & Plan Note (Signed)
Patient is a 72 year old female with new diagnosis of gastric cancer. This appears to be a fairly sizable mass based on the imaging available from her endoscopy.  Given the size and the proximity to the GE junction, I recommend neoadjuvant treatment prior to surgery.  Also, given the size of the tumor she is increased likelihood of micrometastatic disease.   She also requires referral to nutrition. She has lost 30 pounds and needs to increase her protein stores prior to proximal versus total gastrectomy.  I stressed to the patient that doing surgery does not make her immediately for better in terms of her pain and issues with appetite and nausea. There is a significant recovery period rom surgery and this can really inhibit proceeding with chemotherapy afterwards.  I briefly discussed anatomy and surgical options. I also discussed that if metastatic disease was located, that she would not be a surgical candidate in any event.  I have sent a message to her oncologist at [facility] regarding this plan. I will see her back toward the end of chemotherapy to schedule surgery.  30 min spent in counseling.

## 2013-06-30 ENCOUNTER — Encounter (HOSPITAL_BASED_OUTPATIENT_CLINIC_OR_DEPARTMENT_OTHER): Payer: Medicare Other

## 2013-06-30 ENCOUNTER — Encounter (HOSPITAL_COMMUNITY): Payer: Self-pay | Admitting: *Deleted

## 2013-06-30 ENCOUNTER — Encounter (HOSPITAL_COMMUNITY): Payer: Self-pay

## 2013-06-30 VITALS — BP 89/62 | HR 89 | Temp 98.6°F | Resp 16 | Wt 196.2 lb

## 2013-06-30 DIAGNOSIS — D3500 Benign neoplasm of unspecified adrenal gland: Secondary | ICD-10-CM

## 2013-06-30 DIAGNOSIS — I10 Essential (primary) hypertension: Secondary | ICD-10-CM

## 2013-06-30 DIAGNOSIS — D3501 Benign neoplasm of right adrenal gland: Secondary | ICD-10-CM

## 2013-06-30 DIAGNOSIS — R000 Tachycardia, unspecified: Secondary | ICD-10-CM

## 2013-06-30 DIAGNOSIS — C169 Malignant neoplasm of stomach, unspecified: Secondary | ICD-10-CM

## 2013-06-30 LAB — VITAMIN B12: Vitamin B-12: 1093 pg/mL — ABNORMAL HIGH (ref 211–911)

## 2013-06-30 LAB — IRON AND TIBC
Iron: 20 ug/dL — ABNORMAL LOW (ref 42–135)
Saturation Ratios: 6 % — ABNORMAL LOW (ref 20–55)
UIBC: 295 ug/dL (ref 125–400)

## 2013-06-30 LAB — DIFFERENTIAL
Basophils Absolute: 0 10*3/uL (ref 0.0–0.1)
Basophils Relative: 0 % (ref 0–1)
Eosinophils Relative: 0 % (ref 0–5)
Lymphocytes Relative: 16 % (ref 12–46)
Lymphs Abs: 1.3 10*3/uL (ref 0.7–4.0)
Monocytes Absolute: 0.7 10*3/uL (ref 0.1–1.0)
Neutro Abs: 6.3 10*3/uL (ref 1.7–7.7)

## 2013-06-30 LAB — CBC
HCT: 34.1 % — ABNORMAL LOW (ref 36.0–46.0)
Hemoglobin: 11.1 g/dL — ABNORMAL LOW (ref 12.0–15.0)
MCV: 77.9 fL — ABNORMAL LOW (ref 78.0–100.0)
RBC: 4.38 MIL/uL (ref 3.87–5.11)
RDW: 15.1 % (ref 11.5–15.5)
WBC: 8.3 10*3/uL (ref 4.0–10.5)

## 2013-06-30 LAB — FERRITIN: Ferritin: 62 ng/mL (ref 10–291)

## 2013-06-30 MED ORDER — DEXAMETHASONE 4 MG PO TABS: ORAL_TABLET | ORAL | Status: DC

## 2013-06-30 MED ORDER — LIDOCAINE-PRILOCAINE 2.5-2.5 % EX CREA: TOPICAL_CREAM | CUTANEOUS | Status: DC

## 2013-06-30 NOTE — Patient Instructions (Addendum)
[facility] Discharge Instructions  RECOMMENDATIONS MADE BY THE CONSULTANT AND ANY TEST RESULTS WILL BE SENT TO YOUR REFERRING PHYSICIAN.  EXAM FINDINGS BY THE PHYSICIAN TODAY AND SIGNS OR SYMPTOMS TO REPORT TO CLINIC OR PRIMARY PHYSICIAN: Exam and findings as discussed by Dr. Zigmund Daniel.  Will do a study of your heart and if every thing is ok we will get you started on chemotherapy after your port is placed in October.  The chemotherapy agents that we plan to give you are Epirubicin, Cisplatin and 5 Fluorouracil.  MEDICATIONS PRESCRIBED:  none  INSTRUCTIONS/FOLLOW-UP: Chemotherapy teaching on 9/29 at 12 noon - you will meet with Rosana Berger. MUGA on 9/29 at 1:30 am.  Report to xray at 1:15pm You will see the MD on the day that you get chemotherapy.   Thank you for choosing [facility] to provide your oncology and hematology care.  To afford each patient quality time with our providers, please arrive at least 15 minutes before your scheduled appointment time.  With your help, our goal is to use those 15 minutes to complete the necessary work-up to ensure our physicians have the information they need to help with your evaluation and healthcare recommendations.    Effective January 1st, 2014, we ask that you re-schedule your appointment with our physicians should you arrive 10 or more minutes late for your appointment.  We strive to give you quality time with our providers, and arriving late affects you and other patients whose appointments are after yours.    Again, thank you for choosing [facility].  Our hope is that these requests will decrease the amount of time that you wait before being seen by our physicians.       _____________________________________________________________  Should you have questions after your visit to [facility]: Lambert Rhonda W, please contact our office at [phone] between the hours of 8:30 a.m. and 5:00 p.m.   Voicemails left after 4:30 p.m. will not be returned until the following business day.  For prescription refill requests, have your pharmacy contact our office with your prescription refill request.

## 2013-06-30 NOTE — Progress Notes (Signed)
[facility] OFFICE PROGRESS NOTE  Sharon Courier, [name] [address] [place] [address] [place] [address]  DIAGNOSIS: Adenocarcinoma of stomach - Plan: NM Cardiac Muga Rest, CBC, Differential, Gamma GT, CEA, Cancer antigen 19-9, Ferritin, Iron and TIBC, Vitamin B12  Benign neoplasm of adrenal gland, right - Plan: CBC, Differential, Gamma GT, CEA, Cancer antigen 19-9, Ferritin, Iron and TIBC, Vitamin B12  Chief Complaint  Patient presents with  . Gastric Cancer    CURRENT THERAPY: None at this time. Treatment planning strategy being developed.  INTERVAL HISTORY: [name] 72 y.o. female returns for discussion and consideration of management of primary gastric cancer. Patient was seen by Dr. Donell Beers who feels that neoadjuvant chemotherapy would be useful prior to surgery. Currently the patient feels much better than she did last week with no further nausea. Her mouth is dry and she is somewhat anorexia and has lost about 8 pounds. She denies any vomiting, diarrhea, constipation, melena, hematochezia, hematuria, vaginal bleeding, cough, or wheezing. She is only taking 2 oxycodone tablets daily for pain. She continues on metoclopramide 4 times a day to control nausea.   MEDICAL HISTORY: Past Medical History  Diagnosis Date  . Hypertension   . Paroxysmal SVT (supraventricular tachycardia)   . Hypercholesteremia   . GERD (gastroesophageal reflux disease)   . PUD (peptic ulcer disease)   . DVT (deep venous thrombosis) 2007  . Adrenal adenoma     left  . Thyroid nodule     left  . H. pylori infection   . Diverticulosis   . 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 Diarrhea     recurrent  . Arthritis   . Hiatal hernia   . Cancer     adenocarcinoma    INTERIM HISTORY: has BENIGN NEOPLASM OF ADRENAL GLAND; CYST OF THYROID; DYSLIPIDEMIA; OVERWEIGHT/OBESITY; TOBACCO USER; HYPERTENSION; PSVT; DVT; GERD; GASTRIC ULCER, HX OF; COLONIC  POLYPS, HX OF; Dyspepsia; Adenocarcinoma of stomach; Chest pain; and Hypertension on her problem list.    ALLERGIES:  has No Known Allergies.  MEDICATIONS: has a current medication list which includes the following prescription(s): alprazolam, dicyclomine, diltiazem, docusate calcium, fish oil-omega-3 fatty acids, linaclotide, lovastatin, metoclopramide, metoprolol succinate, oxycodone, pantoprazole, probiotic product, prochlorperazine, and sucralfate.  SURGICAL HISTORY:  Past Surgical History  Procedure Laterality Date  . Total knee arthroplasty  bilateral knee  . Hip  replacement  left  . Appendectomy    . Cholecystectomy    . Abdominal hysterectomy    . Breast lumpectomy      left  . Ablation      of SVT  . Cardiac catheterization  2009    normal coronary arteries  . Colonoscopy  09/13/2009    XBM:[id number] internal hemorrhoids  . Esophagogastroduodenoscopy  09/13/2009    IHK:[id number]/  . Esophagogastroduodenoscopy N/A 06/11/2013    Procedure: ESOPHAGOGASTRODUODENOSCOPY (EGD);  Surgeon: West Bali, [name];  Location: [facility] SUITE;  Service: Endoscopy;  Laterality: N/A;  2:15-moved to 1045 [name] notified pt    FAMILY HISTORY: family history includes Cancer in her mother and sister; Heart disease in her brother and sister. There is no history of Colon cancer.  SOCIAL HISTORY:  reports that she has been smoking.  She has never used smokeless tobacco. She reports that  drinks alcohol. She reports that she does not use illicit drugs.  REVIEW OF SYSTEMS:  Other than that discussed above is noncontributory.  PHYSICAL EXAMINATION: ECOG PERFORMANCE STATUS:  1 - Symptomatic but completely ambulatory  Blood pressure 89/62, pulse 89, temperature 98.6 F (37 C), temperature source Oral, resp. rate 16, weight 196 lb 3.2 oz (88.996 kg).  GENERAL:alert, no distress and comfortable. Early obese. SKIN: skin color, texture, turgor are normal, no rashes or significant lesions EYES:  normal, Conjunctiva are pink and non-injected, sclera clear OROPHARYNX:no exudate, no erythema and lips, buccal mucosa, and tongue normal  NECK: supple, thyroid normal size, non-tender, without nodularity CHEST: Normal AP diameter with no breast masses. LYMPH:  no palpable lymphadenopathy in the cervical, axillary or inguinal LUNGS: clear to auscultation and percussion with normal breathing effort HEART: regular rate & rhythm and no murmurs and no lower extremity edema ABDOMEN:abdomen soft, non-tender and normal bowel sounds. No masses appreciated.. Musculoskeletal:no cyanosis of digits and no clubbing  NEURO: alert & oriented x 3 with fluent speech, no focal motor/sensory deficits   LABORATORY DATA: Hospital Outpatient Visit on 06/24/2013  Component Date Value Range Status  . Glucose-Capillary 06/24/2013 114* 70 - 99 mg/dL Final  Admission on 29/56/2130, Discharged on 06/17/2013  Component Date Value Range Status  . Sodium 06/16/2013 138  135 - 145 mEq/L Final  . Potassium 06/16/2013 3.4* 3.5 - 5.1 mEq/L Final  . Chloride 06/16/2013 99  96 - 112 mEq/L Final  . CO2 06/16/2013 29  19 - 32 mEq/L Final  . Glucose, Bld 06/16/2013 125* 70 - 99 mg/dL Final  . BUN 86/57/8469 6  6 - 23 mg/dL Final  . Creatinine, Ser 06/16/2013 0.82  0.50 - 1.10 mg/dL Final  . Calcium 62/95/2841 10.1  8.4 - 10.5 mg/dL Final  . GFR calc non Af Amer 06/16/2013 69* >90 mL/min Final  . GFR calc Af Amer 06/16/2013 80* >90 mL/min Final   Comment: (NOTE)                          The eGFR has been calculated using the CKD EPI equation.                          This calculation has not been validated in all clinical situations.                          eGFR's persistently <90 mL/min signify possible Chronic Kidney                          Disease.  . WBC 06/16/2013 7.4  4.0 - 10.5 K/uL Final  . RBC 06/16/2013 4.69  3.87 - 5.11 MIL/uL Final  . Hemoglobin 06/16/2013 12.2  12.0 - 15.0 g/dL Final  . HCT 32/44/0102 36.9   36.0 - 46.0 % Final  . MCV 06/16/2013 78.7  78.0 - 100.0 fL Final  . MCH 06/16/2013 26.0  26.0 - 34.0 pg Final  . MCHC 06/16/2013 33.1  30.0 - 36.0 g/dL Final  . RDW 72/53/6644 15.0  11.5 - 15.5 % Final  . Platelets 06/16/2013 396  150 - 400 K/uL Final  . Neutrophils Relative % 06/16/2013 65  43 - 77 % Final  . Neutro Abs 06/16/2013 4.8  1.7 - 7.7 K/uL Final  . Lymphocytes Relative 06/16/2013 26  12 - 46 % Final  . Lymphs Abs 06/16/2013 1.9  0.7 - 4.0 K/uL Final  . Monocytes Relative 06/16/2013 8  3 - 12 % Final  . Monocytes Absolute  06/16/2013 0.6  0.1 - 1.0 K/uL Final  . Eosinophils Relative 06/16/2013 1  0 - 5 % Final  . Eosinophils Absolute 06/16/2013 0.1  0.0 - 0.7 K/uL Final  . Basophils Relative 06/16/2013 0  0 - 1 % Final  . Basophils Absolute 06/16/2013 0.0  0.0 - 0.1 K/uL Final  . Troponin I 06/16/2013 <0.30  <0.30 ng/mL Final   Comment:                                 Due to the release kinetics of cTnI,                          a negative result within the first hours                          of the onset of symptoms does not rule out                          myocardial infarction with certainty.                          If myocardial infarction is still suspected,                          repeat the test at appropriate intervals.  . Troponin I 06/16/2013 <0.30  <0.30 ng/mL Final   Comment:                                 Due to the release kinetics of cTnI,                          a negative result within the first hours                          of the onset of symptoms does not rule out                          myocardial infarction with certainty.                          If myocardial infarction is still suspected,                          repeat the test at appropriate intervals.  . Troponin I 06/16/2013 <0.30  <0.30 ng/mL Final   Comment:                                 Due to the release kinetics of cTnI,                          a negative result within the  first hours                          of the onset of symptoms does not rule out  myocardial infarction with certainty.                          If myocardial infarction is still suspected,                          repeat the test at appropriate intervals.  . Troponin I 06/16/2013 <0.30  <0.30 ng/mL Final   Comment:                                 Due to the release kinetics of cTnI,                          a negative result within the first hours                          of the onset of symptoms does not rule out                          myocardial infarction with certainty.                          If myocardial infarction is still suspected,                          repeat the test at appropriate intervals.  Admission on 06/04/2013, Discharged on 06/04/2013  Component Date Value Range Status  . Sodium 06/04/2013 137  135 - 145 mEq/L Final  . Potassium 06/04/2013 4.5  3.5 - 5.1 mEq/L Final  . Chloride 06/04/2013 99  96 - 112 mEq/L Final  . CO2 06/04/2013 28  19 - 32 mEq/L Final  . Glucose, Bld 06/04/2013 97  70 - 99 mg/dL Final  . BUN 16/07/9603 9  6 - 23 mg/dL Final  . Creatinine, Ser 06/04/2013 0.80  0.50 - 1.10 mg/dL Final  . Calcium 54/06/8118 9.9  8.4 - 10.5 mg/dL Final  . Total Protein 06/04/2013 7.3  6.0 - 8.3 g/dL Final  . Albumin 14/78/2956 3.6  3.5 - 5.2 g/dL Final  . AST 21/30/8657 19  0 - 37 U/L Final  . ALT 06/04/2013 19  0 - 35 U/L Final  . Alkaline Phosphatase 06/04/2013 54  39 - 117 U/L Final  . Total Bilirubin 06/04/2013 0.3  0.3 - 1.2 mg/dL Final  . GFR calc non Af Amer 06/04/2013 71* >90 mL/min Final  . GFR calc Af Amer 06/04/2013 83* >90 mL/min Final   Comment: (NOTE)                          The eGFR has been calculated using the CKD EPI equation.                          This calculation has not been validated in all clinical situations.                          eGFR's persistently <90 mL/min signify possible Chronic Kidney  Disease.  . WBC 06/04/2013 5.6  4.0 - 10.5 K/uL Final  . RBC 06/04/2013 4.76  3.87 - 5.11 MIL/uL Final  . Hemoglobin 06/04/2013 12.3  12.0 - 15.0 g/dL Final  . HCT 16/07/9603 37.8  36.0 - 46.0 % Final  . MCV 06/04/2013 79.4  78.0 - 100.0 fL Final  . MCH 06/04/2013 25.8* 26.0 - 34.0 pg Final  . MCHC 06/04/2013 32.5  30.0 - 36.0 g/dL Final  . RDW 54/06/8118 14.5  11.5 - 15.5 % Final  . Platelets 06/04/2013 339  150 - 400 K/uL Final  . Neutrophils Relative % 06/04/2013 58  43 - 77 % Final  . Neutro Abs 06/04/2013 3.3  1.7 - 7.7 K/uL Final  . Lymphocytes Relative 06/04/2013 34  12 - 46 % Final  . Lymphs Abs 06/04/2013 1.9  0.7 - 4.0 K/uL Final  . Monocytes Relative 06/04/2013 7  3 - 12 % Final  . Monocytes Absolute 06/04/2013 0.4  0.1 - 1.0 K/uL Final  . Eosinophils Relative 06/04/2013 1  0 - 5 % Final  . Eosinophils Absolute 06/04/2013 0.0  0.0 - 0.7 K/uL Final  . Basophils Relative 06/04/2013 0  0 - 1 % Final  . Basophils Absolute 06/04/2013 0.0  0.0 - 0.1 K/uL Final  . Lipase 06/04/2013 30  11 - 59 U/L Final  . Color, Urine 06/04/2013 YELLOW  YELLOW Final  . APPearance 06/04/2013 CLEAR  CLEAR Final  . Specific Gravity, Urine 06/04/2013 <1.005* 1.005 - 1.030 Final  . pH 06/04/2013 6.0  5.0 - 8.0 Final  . Glucose, UA 06/04/2013 NEGATIVE  NEGATIVE mg/dL Final  . Hgb urine dipstick 06/04/2013 NEGATIVE  NEGATIVE Final  . Bilirubin Urine 06/04/2013 NEGATIVE  NEGATIVE Final  . Ketones, ur 06/04/2013 NEGATIVE  NEGATIVE mg/dL Final  . Protein, ur 14/78/2956 NEGATIVE  NEGATIVE mg/dL Final  . Urobilinogen, UA 06/04/2013 0.2  0.0 - 1.0 mg/dL Final  . Nitrite 21/30/8657 NEGATIVE  NEGATIVE Final  . Leukocytes, UA 06/04/2013 NEGATIVE  NEGATIVE Final   MICROSCOPIC NOT DONE ON URINES WITH NEGATIVE PROTEIN, BLOOD, LEUKOCYTES, NITRITE, OR GLUCOSE <1000 mg/dL.  Marland Kitchen Fecal Occult Bld 06/04/2013 POSITIVE* NEGATIVE Final     Urinalysis    Component Value Date/Time   COLORURINE YELLOW  06/04/2013 1656   APPEARANCEUR CLEAR 06/04/2013 1656   LABSPEC <1.005* 06/04/2013 1656   PHURINE 6.0 06/04/2013 1656   GLUCOSEU NEGATIVE 06/04/2013 1656   HGBUR NEGATIVE 06/04/2013 1656   BILIRUBINUR NEGATIVE 06/04/2013 1656   KETONESUR NEGATIVE 06/04/2013 1656   PROTEINUR NEGATIVE 06/04/2013 1656   UROBILINOGEN 0.2 06/04/2013 1656   NITRITE NEGATIVE 06/04/2013 1656   LEUKOCYTESUR NEGATIVE 06/04/2013 1656    RADIOGRAPHIC STUDIES: Ct Abdomen Pelvis W Contrast  06/04/2013   *RADIOLOGY REPORT*  Clinical Data: Epigastric pain.  CT ABDOMEN AND PELVIS WITH CONTRAST  Technique:  Multidetector CT imaging of the abdomen and pelvis was performed following the standard protocol during bolus administration of intravenous contrast.  Contrast: OMNIPAQUE IOHEXOL 300 MG/ML  SOLN  Comparison: CT abdomen and pelvis 09/30/2012.  Findings: The lung bases are clear.  No pleural or pericardial effusion.  The patient is status post cholecystectomy.  There is mild appearing fatty infiltration of the liver.  No focal liver lesion is identified.  The spleen, pancreas and right kidney appear normal. The patient has low attenuating adrenal lesions bilaterally, larger on the left, compatible with adenomas, unchanged.  There is an unchanged left renal cyst. There are some scattered colonic diverticula  without diverticulitis.  The stomach and small bowel appear normal.  The appendix and uterus have been removed.  No lytic or sclerotic bony lesion is identified with lumbar degenerative change appearing worst at L3-4 noted.  IMPRESSION:  1.  No acute finding or finding to explain the patient's symptoms. 2.  Bilateral adrenal adenomas, unchanged. 3.  Mild diverticulosis without diverticulitis. 4.  Mild fatty infiltration of the liver.   Original Report Authenticated By: Holley Dexter, M.D.   Nm Pet Image Initial (pi) Skull Base To Thigh  06/24/2013   *RADIOLOGY REPORT*  Clinical Data:  Initial treatment strategy for gastric  adenocarcinoma diagnosed at recent endoscopy.  NUCLEAR MEDICINE PET WHOLE BODY  Fasting Blood Glucose:  114  Technique:  19.3 mCi F-18 FDG was injected intravenously. CT data was obtained and used for attenuation correction and anatomic localization only.  (This was not acquired as a diagnostic CT examination.) Additional exam technical data entered on technologist worksheet.  Comparison:  CT abdomen/pelvis 06/04/2013  Findings:  Head/Neck:   No hypermetabolic lymph nodes in the neck.  Chest:   No hypermetabolic mediastinal or hilar nodes.  No suspicious pulmonary nodules on the CT scan.  Abdomen/Pelvis:  There is concentric abnormal F D G uptake within the gastric cardia/greater curvature corresponding to diffuse mild wall thickening at this level on corresponding CT images, representative S U V max 9.3.  There is mild F D G uptake associated with bilateral adrenal adenomas, a representative S U V maximum within the left adrenal gland measuring 7.7.  Adenomas may demonstrate internal F D G uptake.  Skeleton:  Non mass-like uptake adjacent to the left total hip arthroplasty is noted, compatible with postsurgical change.  Physiologic uptake within bowel, liver, and renal collecting systems is noted.  There is mild marrow inhomogeneity with FDG uptake which may be due to certain medications or disc degenerative change.  No focal area of abnormal F D G uptake is seen to suggest osseous metastatic disease, although a subtle lesion could be obscured by the presence of marrow uptake.  Extremities:  No hypermetabolic activity to suggest metastasis.  IMPRESSION: Abnormal F D G uptake associated with concentric mass-like thickening of the gastric cardia/greater curvature, compatible with the given diagnosis of adenocarcinoma.  No FDG avid metastatic disease within the neck, chest, abdomen, or pelvis.  Bilateral adrenal adenomas are re-identified with mild internal F D G uptake, which may be a normal feature associated with  this entity.   Original Report Authenticated By: Christiana Pellant, M.D.   Dg Chest Port 1 View  06/16/2013   *RADIOLOGY REPORT*  Clinical Data: Chest congestion.  Chest pain.  Smoker.  PORTABLE CHEST - 1 VIEW  Comparison: 10/05/2012  Findings: The heart size and pulmonary vascularity are normal. The lungs appear clear and expanded without focal air space disease or consolidation. No blunting of the costophrenic angles.  No pneumothorax.  Mediastinal contours appear intact.  No significant change since previous study.  IMPRESSION: No evidence of active pulmonary disease.   Original Report Authenticated By: Burman Nieves, M.D.   Dg Abd 2 Views  06/02/2013   *RADIOLOGY REPORT*  Clinical Data: Abdominal pain and bloating.  Dyspepsia.  ABDOMEN - 2 VIEW  Comparison: 09/30/2012.  Findings: Nonobstructive bowel gas pattern is present. Cholecystectomy clips are present in the right upper quadrant.  No dilated loops of large or small bowel.  Gaseous distention of the sigmoid colon.  Left total hip arthroplasty incidentally noted. Lumbar spondylosis and scoliosis.  IMPRESSION: No acute abnormality.  Nonobstructive bowel gas pattern. Cholecystectomy.   Original Report Authenticated By: Andreas Newport, M.D.    ASSESSMENT: . #1. Gastric cancer without lymph node involvement, for neoadjuvant chemotherapy followed by surgery followed by additional chemotherapy. No plans for radiation treatment. #2. Adrenal adenoma, stable. #3. Hypertension, controlled. #4. Episodes of supraventricular tachycardia, stable.   PLAN: #1. Baseline MUGA scan. #2. MediPort insertion 07/06/2013. #3. Chemotherapy teaching. #4. Begin ECF chemotherapy on 07/11/2013 along with his visit. #5. The patient is in agreement with this strategy.   All questions were answered. The patient knows to call the clinic with any problems, questions or concerns. We can certainly see the patient much sooner if necessary.  I spent 30 minutes counseling the  patient face to face. The total time spent in the appointment was 25 minutes.    Maurilio Lovely, [name] 06/30/2013 12:11 PM

## 2013-06-30 NOTE — Patient Instructions (Addendum)
[facility]   CHEMOTHERAPY INSTRUCTIONS   Epirubicin - can cause bone marrow suppression (lowers white blood cells (fight infection), lowers red blood cells (make up your blood), lowers platelets (help blood to clot). Nausea/vomiting, fatigue, mouth sores, diarrhea, cardiotoxity (this is why we will have 2D echoes performed on you periodically during treatment), hair loss, and the drug will turn your urine RED for a few voids after receiving it (urine should turn back to yellow to clear).  Cisplatin - this medication is hard on your kidneys. This is why we give you a large amount of fluids through your IV while you are here getting chemo. We also need you drinking 64 oz of fluid (preferably water/decaff fluids) 2 days prior to chemo and for up to 4-5 days after chemo. Drink more if you can. This will help keep your kidneys flushed. This can also cause acute and/or delayed nausea/vomiting. You must take your nausea meds as prescribed if you get nauseated. Do not wait until you start vomiting. This med can also cause peripheral neuropathy (numbness/tingling/burning in hands/fingers/feet/toes). Let us know if this develops so that we can monitor it and treat if necessary.  5FU: bone marrow suppression (low white blood cells - wbcs fight infection, low red blood cells - rbcs make up your blood, low platelets - this is what makes your blood clot), nausea/vomiting, diarrhea, mouth sores, hair loss, dry skin, ocular toxicities (increased tear production, sensitivity to light). You must wear sunscreen/sunglasses. Cover your skin when out in sunlight. You will get burned very easily.   You will wear a pump throughout all of the chemotherapy that you take with this drug 5FU infusing at a very slow rate. You will come to the Cancer Center once a week to have your port-a-cath needle removed and a new one inserted into your port. This is to decrease the risk of infection. A new bag  of 1fu will be administered every week.     POTENTIAL SIDE EFFECTS OF TREATMENT: Increased Susceptibility to Infection, Vomiting, Constipation, Red or Pink Urine (with Adriamycin), Hair Thinning, Changes in Character of Skin and Nails (brittleness, dryness,etc.), Bone Marrow Suppression, Abdominal Cramping, Complete Hair Loss, Nausea, Diarrhea, Sun Sensitivity and Mouth Sores   SELF IMAGE NEEDS AND REFERRALS MADE: Obtain hair accessories as soon as possible (wigs, scarves, turbans,caps,etc.)  Referral to Look Good, Feel Better consultant   EDUCATIONAL MATERIALS GIVEN AND REVIEWED: Chemotherapy and You Specific Instructions Sheets: Epirubicin, Cisplatin, 5FU, port-a-cath, EMLA cream, Reglan/metoclopramide, Compazine/prochlorperazine, Dexamethasone   SELF CARE ACTIVITIES WHILE ON CHEMOTHERAPY: Increase your fluid intake 48 hours prior to treatment and drink at least 2 quarts per day after treatment., No alcohol intake., No aspirin or other medications unless approved by your oncologist., Eat foods that are light and easy to digest., Eat foods at cold or room temperature., No fried, fatty, or spicy foods immediately before or after treatment., Have teeth cleaned professionally before starting treatment. Keep dentures and partial plates clean., Use soft toothbrush and do not use mouthwashes that contain alcohol. Biotene is a good mouthwash that is available at most pharmacies or may be ordered by calling [phone]., Use warm salt water gargles (1 teaspoon salt per 1 quart warm water) before and after meals and at bedtime. Or you may rinse with 2 tablespoons of three -percent hydrogen peroxide mixed in eight ounces of water., Always use sunscreen with SPF (Sun Protection Factor) of 30 or higher., Use your nausea medication as directed  to prevent nausea., Use your stool softener or laxative as directed to prevent constipation. and Use your anti-diarrheal medication as directed to stop  diarrhea.  Please wash your hands for at least 30 seconds using warm soapy water. Handwashing is the #1 way to prevent the spread of germs. Stay away from sick people or people who are getting over a cold. If you develop respiratory systems such as green/yellow mucus production or productive cough or persistent cough let us know and we will see if you need an antibiotic. It is a good idea to keep a pair of gloves on when going into grocery stores/[employer] to decrease your risk of coming into contact with germs on the carts, etc. Carry alcohol hand gel with you at all times and use it frequently if out in public. All foods need to be cooked thoroughly. No raw foods. No medium or undercooked meats, eggs. If your food is cooked medium well, it does not need to be hot pink or saturated with bloody liquid at all. Vegetables and fruits need to be washed/rinsed under the faucet with a dish detergent before being consumed. You can eat raw fruits and vegetables unless we tell you otherwise but it would be best if you cooked them or bought frozen. Do not eat off of salad bars or hot bars unless you really trust the cleanliness of the restaurant. If you need dental work, please let us know before you go for your appointment so that we can coordinate the best possible time for you in regards to your chemo regimen. You need to also let your dentist know that you are actively taking chemo. We may need to do labs prior to your dental appointment. We also want your bowels moving at least every other day. If this is not happening, we need to know so that we can get you on a bowel regimen to help you go.      MEDICATIONS: You have been given prescriptions for the following medications:  Dexamethasone 4mg  tablet. The day after chemo take 2 tabs in the am. Then for the next 2 days, take 2 tabs in the am and 2 tabs in the pm. Then Stop. Take with food. (this medication can make it difficult for you to sleep, make you  moody/emotional/irritable, or make you feel flushed in the face/neck/chest - these side effects will subside once you stop taking the drug).  Reglan/metoclopramide 10mg  tablet. Take 1 tablet four times a day.  Compazine/proclorperazine 10mg  tablet. Starting the day after chemo, take 1 tablet every 6 hours x 48 hours after chemo. Then may take 1 tablet every 6 hours if needed for nausea/vomiting. Refer to your calendar.   EMLA cream. Apply a quarter size amount to port site 1 hour prior to chemo. Do not rub in. Cover with plastic wrap.   Over-the-Counter Meds:  Colace - this is a stool softener. Take 100mg  capsule 2-6 times a day as needed. If you have to take more than 6 capsules of Colace a day call the Cancer Center.  Senna - this is a mild laxative used to treat mild constipation. May take up to 3 tablets @ bedtime if needed for constipation.   Milk of Magnesia - this is a laxative used to treat moderate to severe constipation. May take 2-4 tablespoons every 8 hours as needed. May increase to 8 tablespoons x 1 dose and if no bowel movement call the Cancer Center.  Imodium - this is for diarrhea. Take  2 tabs after 1st loose stool and then 1 tab after each loose stool until you go a total of 12 hours without a loose stool. Call Cancer Center if loose stools continue. (if it is the weekend - buy Imodium and start taking per these instructions; however, if it is the week day --call the Cancer Center and we will prescribe you Lomotil for your diarrhea).   SYMPTOMS TO REPORT AS SOON AS POSSIBLE AFTER TREATMENT:  FEVER GREATER THAN 100.5 F  CHILLS WITH OR WITHOUT FEVER  NAUSEA AND VOMITING THAT IS NOT CONTROLLED WITH YOUR NAUSEA MEDICATION  UNUSUAL SHORTNESS OF BREATH  UNUSUAL BRUISING OR BLEEDING  TENDERNESS IN MOUTH AND THROAT WITH OR WITHOUT PRESENCE OF ULCERS  URINARY PROBLEMS  BOWEL PROBLEMS  UNUSUAL RASH    Wear comfortable clothing and clothing appropriate for easy access  to any Portacath or PICC line. Let us know if there is anything that we can do to make your therapy better!      I have been informed and understand all of the instructions given to me and have received a copy. I have been instructed to call the clinic [phone] or my family physician as soon as possible for continued medical care, if indicated. I do not have any more questions at this time but understand that I may call the Cancer Center or the Patient Navigator at [phone] during office hours should I have questions or need assistance in obtaining follow-up care.      _________________________________________      _______________     __________ Signature of Patient or Authorized Representative        Date                            Time      _________________________________________ Nurse's Signature      Epirubicin injection What is this medicine? EPIRUBICIN (ep i ROO bi sin) is a chemotherapy drug. This medicine is used to treat breast cancer. This medicine may be used for other purposes; ask your health care provider or pharmacist if you have questions. What should I tell my health care provider before I take this medicine? They need to know if you have any of these conditions: -blood disorders -heart disease, recent heart attack -infection (especially a virus infection such as chickenpox, cold sores, or herpes) -irregular heartbeat -kidney disease -liver disease -recent or ongoing radiation therapy -an unusual or allergic reaction to epirubicin, other chemotherapy agents, other medicines, foods, dyes, or preservatives -pregnant or trying to get pregnant -breast-feeding How should I use this medicine? This drug is given as an infusion into a vein. It is administered in a hospital or clinic by a specially trained health care professional. If you have pain, swelling, burning or any unusual feeling around the site of your injection, tell your health care  professional right away. Talk to your pediatrician regarding the use of this medicine in children. Special care may be needed. Overdosage: If you think you have taken too much of this medicine contact a poison control center or emergency room at once. NOTE: This medicine is only for you. Do not share this medicine with others. What if I miss a dose? It is important not to miss your dose. Call your doctor or health care professional if you are unable to keep an appointment. What may interact with this medicine? Do not take this medicine with any of the following  medications: -cisapride -droperidol -halofantrine -pimozide This medicine may also interact with the following medications: -chloroquine -chlorpromazine -clarithromycin -cimetidine -cyclosporine -erythromycin -medicines for blood pressure like amlodipine, felodipine, nifedipine -medicines for depression, anxiety, or psychotic disturbances -medicines for irregular heart beat like amiodarone, bepridil, dofetilide, encainide, flecainide, propafenone, quinidine -medicines for nausea, vomiting like dolasetron, ondansetron, palonosetron -medicines to increase blood counts like filgrastim, pegfilgrastim, sargramostim -methadone -methotrexate -pentamidine -vaccines Talk to your doctor or health care professional before taking any of these medicines: -acetaminophen -aspirin -ibuprofen -ketoprofen -naproxen This list may not describe all possible interactions. Give your health care provider a list of all the medicines, herbs, non-prescription drugs, or dietary supplements you use. Also tell them if you smoke, drink alcohol, or use illegal drugs. Some items may interact with your medicine. What should I watch for while using this medicine? Your condition will be monitored carefully while you are receiving this medicine. You will need important blood work done while you are taking this medicine. This drug may make you feel generally  unwell. This is not uncommon, as chemotherapy can affect healthy cells as well as cancer cells. Report any side effects. Continue your course of treatment even though you feel ill unless your doctor tells you to stop. Your urine may turn red for a few days after your dose. This is not blood. If your urine is dark or brown, call your doctor. In some cases, you may be given additional medicines to help with side effects. Follow all directions for their use. Call your doctor or health care professional for advice if you get a fever, chills or sore throat, or other symptoms of a cold or flu. Do not treat yourself. This drug decreases your body's ability to fight infections. Try to avoid being around people who are sick. This medicine may increase your risk to bruise or bleed. Call your doctor or health care professional if you notice any unusual bleeding. Be careful brushing and flossing your teeth or using a toothpick because you may get an infection or bleed more easily. If you have any dental work done, tell your dentist you are receiving this medicine. Avoid taking products that contain aspirin, acetaminophen, ibuprofen, naproxen, or ketoprofen unless instructed by your doctor. These medicines may hide a fever. Men and women of childbearing age should use effective birth control methods while using taking this medicine. Do not become pregnant while taking this medicine. There is a potential for serious side effects to an unborn child. Talk to your health care professional or pharmacist for more information. Do not breast-feed an infant while taking this medicine. What side effects may I notice from receiving this medicine? Side effects that you should report to your doctor or health care professional as soon as possible: -allergic reactions like skin rash, itching or hives, swelling of the face, lips, or tongue -low blood counts - this medicine may decrease the number of white blood cells, red blood cells  and platelets. You may be at increased risk for infections and bleeding. -signs of infection - fever or chills, cough, sore throat, pain or difficulty passing urine -signs of decreased platelets or bleeding - bruising, pinpoint red spots on the skin, black, tarry stools, blood in the urine -signs of decreased red blood cells - unusually weak or tired, fainting spells, lightheadedness -breathing problems -chest pain -gout pain -fast, irregular heartbeat -mouth sores -pain, swelling, redness at site where injected -swelling of ankles, feet, or hands Side effects that usually do not require medical attention (  report to your doctor or health care professional if they continue or are bothersome): -changes in skin or nail color -diarrhea -hair loss -hot flashes, facial flushing -increased skin sensitivity to the sun -loss of appetite -nausea, vomiting -red colored urine -stomach upset This list may not describe all possible side effects. Call your doctor for medical advice about side effects. You may report side effects to FDA at 1-800-FDA-1088. Where should I keep my medicine? This drug is given in a hospital or clinic and will not be stored at home. NOTE: This sheet is a summary. It may not cover all possible information. If you have questions about this medicine, talk to your doctor, pharmacist, or health care provider.  2013, [employer]. (01/25/2008 5:21:25 PM) Cisplatin injection What is this medicine? CISPLATIN (SIS pla tin) is a chemotherapy drug. It targets fast dividing cells, like cancer cells, and causes these cells to die. This medicine is used to treat many types of cancer like bladder, ovarian, and testicular cancers. This medicine may be used for other purposes; ask your health care provider or pharmacist if you have questions. What should I tell my health care provider before I take this medicine? They need to know if you have any of these conditions: -blood  disorders -hearing problems -kidney disease -recent or ongoing radiation therapy -an unusual or allergic reaction to cisplatin, carboplatin, other chemotherapy, other medicines, foods, dyes, or preservatives -pregnant or trying to get pregnant -breast-feeding How should I use this medicine? This drug is given as an infusion into a vein. It is administered in a hospital or clinic by a specially trained health care professional. Talk to your pediatrician regarding the use of this medicine in children. Special care may be needed. Overdosage: If you think you have taken too much of this medicine contact a poison control center or emergency room at once. NOTE: This medicine is only for you. Do not share this medicine with others. What if I miss a dose? It is important not to miss a dose. Call your doctor or health care professional if you are unable to keep an appointment. What may interact with this medicine? -dofetilide -foscarnet -medicines for seizures -medicines to increase blood counts like filgrastim, pegfilgrastim, sargramostim -probenecid -pyridoxine used with altretamine -rituximab -some antibiotics like amikacin, gentamicin, neomycin, polymyxin B, streptomycin, tobramycin -sulfinpyrazone -vaccines -zalcitabine Talk to your doctor or health care professional before taking any of these medicines: -acetaminophen -aspirin -ibuprofen -ketoprofen -naproxen This list may not describe all possible interactions. Give your health care provider a list of all the medicines, herbs, non-prescription drugs, or dietary supplements you use. Also tell them if you smoke, drink alcohol, or use illegal drugs. Some items may interact with your medicine. What should I watch for while using this medicine? Your condition will be monitored carefully while you are receiving this medicine. You will need important blood work done while you are taking this medicine. This drug may make you feel generally  unwell. This is not uncommon, as chemotherapy can affect healthy cells as well as cancer cells. Report any side effects. Continue your course of treatment even though you feel ill unless your doctor tells you to stop. In some cases, you may be given additional medicines to help with side effects. Follow all directions for their use. Call your doctor or health care professional for advice if you get a fever, chills or sore throat, or other symptoms of a cold or flu. Do not treat yourself. This drug decreases your  body's ability to fight infections. Try to avoid being around people who are sick. This medicine may increase your risk to bruise or bleed. Call your doctor or health care professional if you notice any unusual bleeding. Be careful brushing and flossing your teeth or using a toothpick because you may get an infection or bleed more easily. If you have any dental work done, tell your dentist you are receiving this medicine. Avoid taking products that contain aspirin, acetaminophen, ibuprofen, naproxen, or ketoprofen unless instructed by your doctor. These medicines may hide a fever. Do not become pregnant while taking this medicine. Women should inform their doctor if they wish to become pregnant or think they might be pregnant. There is a potential for serious side effects to an unborn child. Talk to your health care professional or pharmacist for more information. Do not breast-feed an infant while taking this medicine. Drink fluids as directed while you are taking this medicine. This will help protect your kidneys. Call your doctor or health care professional if you get diarrhea. Do not treat yourself. What side effects may I notice from receiving this medicine? Side effects that you should report to your doctor or health care professional as soon as possible: -allergic reactions like skin rash, itching or hives, swelling of the face, lips, or tongue -signs of infection - fever or chills, cough,  sore throat, pain or difficulty passing urine -signs of decreased platelets or bleeding - bruising, pinpoint red spots on the skin, black, tarry stools, nosebleeds -signs of decreased red blood cells - unusually weak or tired, fainting spells, lightheadedness -breathing problems -changes in hearing -gout pain -low blood counts - This drug may decrease the number of white blood cells, red blood cells and platelets. You may be at increased risk for infections and bleeding. -nausea and vomiting -pain, swelling, redness or irritation at the injection site -pain, tingling, numbness in the hands or feet -problems with balance, movement -trouble passing urine or change in the amount of urine Side effects that usually do not require medical attention (report to your doctor or health care professional if they continue or are bothersome): -changes in vision -loss of appetite -metallic taste in the mouth or changes in taste This list may not describe all possible side effects. Call your doctor for medical advice about side effects. You may report side effects to FDA at 1-800-FDA-1088. Where should I keep my medicine? This drug is given in a hospital or clinic and will not be stored at home. NOTE: This sheet is a summary. It may not cover all possible information. If you have questions about this medicine, talk to your doctor, pharmacist, or health care provider.  2013, [employer]. (12/29/2007 2:40:54 PM) Fluorouracil, 5-FU injection What is this medicine? FLUOROURACIL, 5-FU (flure oh YOOR a sil) is a chemotherapy drug. It slows the growth of cancer cells. This medicine is used to treat many types of cancer like breast cancer, colon or rectal cancer, pancreatic cancer, and stomach cancer. This medicine may be used for other purposes; ask your health care provider or pharmacist if you have questions. What should I tell my health care provider before I take this medicine? They need to know if  you have any of these conditions: -blood disorders -dihydropyrimidine dehydrogenase (DPD) deficiency -infection (especially a virus infection such as chickenpox, cold sores, or herpes) -kidney disease -liver disease -malnourished, poor nutrition -recent or ongoing radiation therapy -an unusual or allergic reaction to fluorouracil, other chemotherapy, other medicines, foods, dyes,  or preservatives -pregnant or trying to get pregnant -breast-feeding How should I use this medicine? This drug is given as an infusion or injection into a vein. It is administered in a hospital or clinic by a specially trained health care professional. Talk to your pediatrician regarding the use of this medicine in children. Special care may be needed. Overdosage: If you think you have taken too much of this medicine contact a poison control center or emergency room at once. NOTE: This medicine is only for you. Do not share this medicine with others. What if I miss a dose? It is important not to miss your dose. Call your doctor or health care professional if you are unable to keep an appointment. What may interact with this medicine? -allopurinol -cimetidine -dapsone -digoxin -hydroxyurea -leucovorin -levamisole -medicines for seizures like ethotoin, fosphenytoin, phenytoin -medicines to increase blood counts like filgrastim, pegfilgrastim, sargramostim -medicines that treat or prevent blood clots like warfarin, enoxaparin, and dalteparin -methotrexate -metronidazole -pyrimethamine -some other chemotherapy drugs like busulfan, cisplatin, estramustine, vinblastine -trimethoprim -trimetrexate -vaccines Talk to your doctor or health care professional before taking any of these medicines: -acetaminophen -aspirin -ibuprofen -ketoprofen -naproxen This list may not describe all possible interactions. Give your health care provider a list of all the medicines, herbs, non-prescription drugs, or dietary  supplements you use. Also tell them if you smoke, drink alcohol, or use illegal drugs. Some items may interact with your medicine. What should I watch for while using this medicine? Visit your doctor for checks on your progress. This drug may make you feel generally unwell. This is not uncommon, as chemotherapy can affect healthy cells as well as cancer cells. Report any side effects. Continue your course of treatment even though you feel ill unless your doctor tells you to stop. In some cases, you may be given additional medicines to help with side effects. Follow all directions for their use. Call your doctor or health care professional for advice if you get a fever, chills or sore throat, or other symptoms of a cold or flu. Do not treat yourself. This drug decreases your body's ability to fight infections. Try to avoid being around people who are sick. This medicine may increase your risk to bruise or bleed. Call your doctor or health care professional if you notice any unusual bleeding. Be careful brushing and flossing your teeth or using a toothpick because you may get an infection or bleed more easily. If you have any dental work done, tell your dentist you are receiving this medicine. Avoid taking products that contain aspirin, acetaminophen, ibuprofen, naproxen, or ketoprofen unless instructed by your doctor. These medicines may hide a fever. Do not become pregnant while taking this medicine. Women should inform their doctor if they wish to become pregnant or think they might be pregnant. There is a potential for serious side effects to an unborn child. Talk to your health care professional or pharmacist for more information. Do not breast-feed an infant while taking this medicine. Men should inform their doctor if they wish to father a child. This medicine may lower sperm counts. Do not treat diarrhea with over the counter products. Contact your doctor if you have diarrhea that lasts more than 2  days or if it is severe and watery. This medicine can make you more sensitive to the sun. Keep out of the sun. If you cannot avoid being in the sun, wear protective clothing and use sunscreen. Do not use sun lamps or tanning beds/booths. What side  effects may I notice from receiving this medicine? Side effects that you should report to your doctor or health care professional as soon as possible: -allergic reactions like skin rash, itching or hives, swelling of the face, lips, or tongue -low blood counts - this medicine may decrease the number of white blood cells, red blood cells and platelets. You may be at increased risk for infections and bleeding. -signs of infection - fever or chills, cough, sore throat, pain or difficulty passing urine -signs of decreased platelets or bleeding - bruising, pinpoint red spots on the skin, black, tarry stools, blood in the urine -signs of decreased red blood cells - unusually weak or tired, fainting spells, lightheadedness -breathing problems -changes in vision -chest pain -mouth sores -nausea and vomiting -pain, swelling, redness at site where injected -pain, tingling, numbness in the hands or feet -redness, swelling, or sores on hands or feet -stomach pain -unusual bleeding Side effects that usually do not require medical attention (report to your doctor or health care professional if they continue or are bothersome): -changes in finger or toe nails -diarrhea -dry or itchy skin -hair loss -headache -loss of appetite -sensitivity of eyes to the light -stomach upset -unusually teary eyes This list may not describe all possible side effects. Call your doctor for medical advice about side effects. You may report side effects to FDA at 1-800-FDA-1088. Where should I keep my medicine? This drug is given in a hospital or clinic and will not be stored at home. NOTE: This sheet is a summary. It may not cover all possible information. If you have questions  about this medicine, talk to your doctor, pharmacist, or health care provider.  2013, [employer]. (01/27/2008 1:53:16 PM) Gastric Cancer  Gastric cancer is a tumor which starts as a growth in your stomach. Cancer is a group of many related diseases that begin in cells, the building blocks of the body. Normally, cells grow and divide to produce more cells only when the body needs them. Sometimes, cells keep dividing when new cells are not needed. These extra cells may form a mass of tissue called a growth or tumor. Tumors can be either benign (not cancerous) or malignant (cancerous). Cancer can begin in any organ or tissue of the body. The original tumor (where the tumor started out) is called the primary cancer and is usually named for where it begins.  Several types of cancer can occur in the stomach. Adenocarcinoma is the most common, accounting for about 95% of gastric tumors. Other cancer types include carcinoid tumors, lymphoma or gastrointestinal stromal cell tumors (GISTs).  CAUSES  Though the exact cause of gastric cancer is not known, there are several known risk factors:  Age over 72.  Female sex.  Race: more common in [place], Pacific Islander, Hispanic and African American people.  Diet high in smoked, salted or pickled foods.  Tobacco and alcohol use.  History of stomach surgery, chronic gastritis, gastric polyps or pernicious anemia.  Stomach infection with H. pylori bacteria (which also increases risk for ulcers).  Genetic factors including family history and blood type A. Note that very few people with risk factors actually develop gastric cancer. SYMPTOMS   Pain.  Loss of appetite.  Problems swallowing.  Nausea and vomiting.  Vomiting blood.  Abdominal pain.  Excessive gas or belching.  Weight loss.  General health problems. DIAGNOSIS  Your caregiver may suspect gastric cancer based on your symptoms and your physical exam. Further testing can  diagnose gastric  cancer. This may include looking for blood in your stool. Gastroscopy (looking at your stomach through an instrument like a thin flexible telescope; also called endoscopy) may also be done. Biopsies can be done if an abnormal growth is found. This is the removal of a small piece of tissue from your stomach if your caregiver notices abnormalities or growths there. The biopsy is looked at under a microscope by a specialist who can tell if cancer is present. If cancer is confirmed, other tests may be needed to see if the cancer has spread beyond the stomach. TREATMENT   Surgical removal of the stomach (gastrectomy) is the only curative treatment. Sometimes only part of the stomach needs to be removed, depending on location of the cancer. Gastrectomy can be done if the cancer is found before it has spread beyond the stomach.  Radiation therapy and chemotherapy may be helpful. Chemotherapy and radiation therapy given after surgery may improve cure rates or make you feel better.  Antibiotics are sometimes used for H.pylori infection.  Advanced techniques to remove or destroy cancer without surgery are being researched.  Feeding tubes or bypass surgery may help if food becomes blocked. The possibility of curing your gastric cancer depends on the type of tumor, the location of the tumor, and whether or not the tumor has spread beyond the stomach. If your cancer cannot be cured, treatment may slow the progression of the disease. Treatments are also available to address pain or other symptoms of cancer. HOME CARE INSTRUCTIONS   Your caregiver may prescribe a specific type of diet. If you have had surgery, then a dietician may help you with an eating plan. Avoid red meats, processed meats, and salty, smoked or pickled foods.  Take any prescribed medications as directed. Do not use more pain medication than directed.  You do not need to limit your activity unless instructed by your  caregiver.  Avoid alcohol and tobacco use.  Keep appointments for tests and with your caregiver or specialists. SEEK MEDICAL CARE IF:   You have problems eating.  You have problems tolerating your medications.  You continue to lose weight despite your treatments. SEEK IMMEDIATE MEDICAL CARE IF:   You have uncontrolled nausea, vomiting or diarrhea.  You have vomiting with blood or coffee-grounds type material.  You have had chemotherapy and you have a fever.  You have uncontrolled pain. Document Released: 06/27/2004 Document Revised: 12/16/2011 Document Reviewed: 10/04/2008 [facility] Patient Information 2014 [place], [place]. Implanted Port Instructions An implanted port is a central line that has a round shape and is placed under the skin. It is used for long-term IV (intravenous) access for:  Medicine.  Fluids.  Liquid nutrition, such as TPN (total parenteral nutrition).  Blood samples. Ports can be placed:  In the chest area just below the collarbone (this is the most common place.)  In the arms.  In the belly (abdomen) area.  In the legs. PARTS OF THE PORT A port has 2 main parts:  The reservoir. The reservoir is round, disc-shaped, and will be a small, raised area under your skin.  The reservoir is the part where a needle is inserted (accessed) to either give medicines or to draw blood.  The catheter. The catheter is a long, slender tube that extends from the reservoir. The catheter is placed into a large vein.  Medicine that is inserted into the reservoir goes into the catheter and then into the vein. INSERTION OF THE PORT  The port is surgically placed  in either an operating room or in a procedural area (interventional radiology).  Medicine may be given to help you relax during the procedure.  The skin where the port will be inserted is numbed (local anesthetic).  1 or 2 small cuts (incisions) will be made in the skin to insert the port.  The port can  be used after it has been inserted. INCISION SITE CARE  The incision site may have small adhesive strips on it. This helps keep the incision site closed. Sometimes, no adhesive strips are placed. Instead of adhesive strips, a special kind of surgical glue is used to keep the incision closed.  If adhesive strips were placed on the incision sites, do not take them off. They will fall off on their own.  The incision site may be sore for 1 to 2 days. Pain medicine can help.  Do not get the incision site wet. Bathe or shower as directed by your caregiver.  The incision site should heal in 5 to 7 days. A small scar may form after the incision has healed. ACCESSING THE PORT Special steps must be taken to access the port:  Before the port is accessed, a numbing cream can be placed on the skin. This helps numb the skin over the port site.  A sterile technique is used to access the port.  The port is accessed with a needle. Only "non-coring" port needles should be used to access the port. Once the port is accessed, a blood return should be checked. This helps ensure the port is in the vein and is not clogged (clotted).  If your caregiver believes your port should remain accessed, a clear (transparent) bandage will be placed over the needle site. The bandage and needle will need to be changed every week or as directed by your caregiver.  Keep the bandage covering the needle clean and dry. Do not get it wet. Follow your caregiver's instructions on how to take a shower or bath when the port is accessed.  If your port does not need to stay accessed, no bandage is needed over the port. FLUSHING THE PORT Flushing the port keeps it from getting clogged. How often the port is flushed depends on:  If a constant infusion is running. If a constant infusion is running, the port may not need to be flushed.  If intermittent medicines are given.  If the port is not being used. For intermittent  medicines:  The port will need to be flushed:  After medicines have been given.  After blood has been drawn.  As part of routine maintenance.  A port is normally flushed with:  Normal saline.  Heparin.  Follow your caregiver's advice on how often, how much, and the type of flush to use on your port. IMPORTANT PORT INFORMATION  Tell your caregiver if you are allergic to heparin.  After your port is placed, you will get a manufacturer's information card. The card has information about your port. Keep this card with you at all times.  There are many types of ports available. Know what kind of port you have.  In case of an emergency, it may be helpful to wear a medical alert bracelet. This can help alert health care workers that you have a port.  The port can stay in for as long as your caregiver believes it is necessary.  When it is time for the port to come out, surgery will be done to remove it. The surgery will be  similar to how the port was put in.  If you are in the hospital or clinic:  Your port will be taken care of and flushed by a nurse.  If you are at home:  A home health care nurse may give medicines and take care of the port.  You or a family member can get special training and directions for giving medicine and taking care of the port at home. SEEK IMMEDIATE MEDICAL CARE IF:   Your port does not flush or you are unable to get a blood return.  New drainage or pus is coming from the incision.  A bad smell is coming from the incision site.  You develop swelling or increased redness at the incision site.  You develop increased swelling or pain at the port site.  You develop swelling or pain in the surrounding skin near the port.  You have an oral temperature above 102 F (38.9 C), not controlled by medicine. MAKE SURE YOU:   Understand these instructions.  Will watch your condition.  Will get help right away if you are not doing well or get  worse. Document Released: 09/23/2005 Document Revised: 12/16/2011 Document Reviewed: 12/15/2008 [facility] Patient Information 2014 [place], [place]. Lidocaine; Prilocaine cream What is this medicine? LIDOCAINE; PRILOCAINE (LYE doe kane; PRIL oh kane) is a topical anesthetic that causes loss of feeling in the skin and surrounding tissues. It is used to numb the skin before procedures or injections. This medicine may be used for other purposes; ask your health care provider or pharmacist if you have questions. What should I tell my health care provider before I take this medicine? They need to know if you have any of these conditions: -glucose-6-phosphate deficiencies -heart disease -kidney or liver disease -methemoglobinemia -an unusual or allergic reaction to lidocaine, prilocaine, other medicines, foods, dyes, or preservatives -pregnant or trying to get pregnant -breast-feeding How should I use this medicine? This medicine is for external use only on the skin. Do not take by mouth. Follow the directions on the prescription label. Wash hands before and after use. Do not use more or leave in contact with the skin longer than directed. Do not apply to eyes or open wounds. It can cause irritation and blurred or temporary loss of vision. If this medicine comes in contact with your eyes, immediately rinse the eye with water. Do not touch or rub the eye. Contact your health care provider right away. Talk to your pediatrician regarding the use of this medicine in children. While this medicine may be prescribed for children for selected conditions, precautions do apply. Overdosage: If you think you have taken too much of this medicine contact a poison control center or emergency room at once. NOTE: This medicine is only for you. Do not share this medicine with others. What if I miss a dose? This medicine is usually only applied once prior to each procedure. It must be in contact with the skin for a  period of time for it to work. If you applied this medicine later than directed, tell your health care professional before starting the procedure. What may interact with this medicine? -acetaminophen -chloroquine -dapsone -medicines to control heart rhythm -nitrates like nitroglycerin and nitroprusside -other ointments, creams, or sprays that may contain anesthetic medicine -phenobarbital -phenytoin -quinine -sulfonamides like sulfacetamide, sulfamethoxazole, sulfasalazine and others This list may not describe all possible interactions. Give your health care provider a list of all the medicines, herbs, non-prescription drugs, or dietary supplements you use. Also  tell them if you smoke, drink alcohol, or use illegal drugs. Some items may interact with your medicine. What should I watch for while using this medicine? Be careful to avoid injury to the treated area while it is numb and you are not aware of pain. Avoid scratching, rubbing, or exposing the treated area to hot or cold temperatures until complete sensation has returned. The numb feeling will wear off a few hours after applying the cream. What side effects may I notice from receiving this medicine? Side effects that you should report to your doctor or health care professional as soon as possible: -blurred vision -chest pain -difficulty breathing -dizziness -drowsiness -fast or irregular heartbeat -skin rash or itching -swelling of your throat, lips, or face -trembling Side effects that usually do not require medical attention (report to your doctor or health care professional if they continue or are bothersome): -changes in ability to feel hot or cold -redness and swelling at the application site This list may not describe all possible side effects. Call your doctor for medical advice about side effects. You may report side effects to FDA at 1-800-FDA-1088. Where should I keep my medicine? Keep out of reach of children. Store  at room temperature between 15 and 30 degrees C (59 and 86 degrees F). Keep container tightly closed. Throw away any unused medicine after the expiration date. NOTE: This sheet is a summary. It may not cover all possible information. If you have questions about this medicine, talk to your doctor, pharmacist, or health care provider.  2013, [employer]. (03/28/2008 5:14:35 PM) Dexamethasone tablets What is this medicine? DEXAMETHASONE (dex a METH a sone) is a corticosteroid. It is commonly used to treat inflammation of the skin, joints, lungs, and other organs. Common conditions treated include asthma, allergies, and arthritis. It is also used for other conditions, such as blood disorders and diseases of the adrenal glands. This medicine may be used for other purposes; ask your health care provider or pharmacist if you have questions. What should I tell my health care provider before I take this medicine? They need to know if you have any of these conditions: -Cushing's syndrome -diabetes -glaucoma -heart problems or disease -high blood pressure -infection like herpes, measles, tuberculosis, or chickenpox -kidney disease -liver disease -mental problems -myasthenia gravis -osteoporosis -previous heart attack -seizures -stomach, ulcer or intestine disease including colitis and diverticulitis -thyroid problem -an unusual or allergic reaction to dexamethasone, corticosteroids, other medicines, lactose, foods, dyes, or preservatives -pregnant or trying to get pregnant -breast-feeding How should I use this medicine? Take this medicine by mouth with a drink of water. Follow the directions on the prescription label. Take it with food or milk to avoid stomach upset. If you are taking this medicine once a day, take it in the morning. Do not take more medicine than you are told to take. Do not suddenly stop taking your medicine because you may develop a severe reaction. Your doctor will  tell you how much medicine to take. If your doctor wants you to stop the medicine, the dose may be slowly lowered over time to avoid any side effects. Talk to your pediatrician regarding the use of this medicine in children. Special care may be needed. Patients over 84 years old may have a stronger reaction and need a smaller dose. Overdosage: If you think you have taken too much of this medicine contact a poison control center or emergency room at once. NOTE: This medicine is only for you. Do  not share this medicine with others. What if I miss a dose? If you miss a dose, take it as soon as you can. If it is almost time for your next dose, talk to your doctor or health care professional. You may need to miss a dose or take an extra dose. Do not take double or extra doses without advice. What may interact with this medicine? Do not take this medicine with any of the following medications: -mifepristone, RU-486 -vaccines This medicine may also interact with the following medications: -amphotericin B -antibiotics like clarithromycin, erythromycin, and troleandomycin -aspirin and aspirin-like drugs -barbiturates like phenobarbital -carbamazepine -cholestyramine -cholinesterase inhibitors like donepezil, galantamine, rivastigmine, and tacrine -cyclosporine -digoxin -diuretics -ephedrine -female hormones, like estrogens or progestins and birth control pills -indinavir -isoniazid -ketoconazole -medicines for diabetes -medicines that improve muscle tone or strength for conditions like myasthenia gravis -NSAIDs, medicines for pain and inflammation, like ibuprofen or naproxen -phenytoin -rifampin -thalidomide -warfarin This list may not describe all possible interactions. Give your health care provider a list of all the medicines, herbs, non-prescription drugs, or dietary supplements you use. Also tell them if you smoke, drink alcohol, or use illegal drugs. Some items may interact with your  medicine. What should I watch for while using this medicine? Visit your doctor or health care professional for regular checks on your progress. If you are taking this medicine over a prolonged period, carry an identification card with your name and address, the type and dose of your medicine, and your doctor's name and address. This medicine may increase your risk of getting an infection. Stay away from people who are sick. Tell your doctor or health care professional if you are around anyone with measles or chickenpox. If you are going to have surgery, tell your doctor or health care professional that you have taken this medicine within the last twelve months. Ask your doctor or health care professional about your diet. You may need to lower the amount of salt you eat. The medicine can increase your blood sugar. If you are a diabetic check with your doctor if you need help adjusting the dose of your diabetic medicine. What side effects may I notice from receiving this medicine? Side effects that you should report to your doctor or health care professional as soon as possible: -allergic reactions like skin rash, itching or hives, swelling of the face, lips, or tongue -changes in vision -fever, sore throat, sneezing, cough, or other signs of infection, wounds that will not heal -increased thirst -mental depression, mood swings, mistaken feelings of self importance or of being mistreated -pain in hips, back, ribs, arms, shoulders, or legs -redness, blistering, peeling or loosening of the skin, including inside the mouth -trouble passing urine or change in the amount of urine -swelling of feet or lower legs -unusual bleeding or bruising Side effects that usually do not require medical attention (report to your doctor or health care professional if they continue or are bothersome): -headache -nausea, vomiting -skin problems, acne, thin and shiny skin -weight gain This list may not describe all  possible side effects. Call your doctor for medical advice about side effects. You may report side effects to FDA at 1-800-FDA-1088. Where should I keep my medicine? Keep out of the reach of children. Store at room temperature between 20 and 25 degrees C (68 and 77 degrees F). Protect from light. Throw away any unused medicine after the expiration date. NOTE: This sheet is a summary. It may not cover all possible  information. If you have questions about this medicine, talk to your doctor, pharmacist, or health care provider.  2013, [employer]. (01/14/2008 2:02:13 PM) Metoclopramide tablets What is this medicine? METOCLOPRAMIDE (met oh kloe PRA mide) is used to treat the symptoms of gastroesophageal reflux disease (GERD) like heartburn. It is also used to treat people with slow emptying of the stomach and intestinal tract. This medicine may be used for other purposes; ask your health care provider or pharmacist if you have questions. What should I tell my health care provider before I take this medicine? They need to know if you have any of these conditions: -breast cancer -depression -diabetes -heart failure -high blood pressure -kidney disease -liver disease -Parkinson's disease or a movement disorder -pheochromocytoma -seizures -stomach obstruction, bleeding, or perforation -an unusual or allergic reaction to metoclopramide, procainamide, sulfites, other medicines, foods, dyes, or preservatives -pregnant or trying to get pregnant -breast-feeding How should I use this medicine? Take this medicine by mouth with a glass of water. Follow the directions on the prescription label. Take this medicine on an empty stomach, about 30 minutes before eating. Take your doses at regular intervals. Do not take your medicine more often than directed. Do not stop taking except on the advice of your doctor or health care professional. A special MedGuide will be given to you by the pharmacist  with each prescription and refill. Be sure to read this information carefully each time. Talk to your pediatrician regarding the use of this medicine in children. Special care may be needed. Overdosage: If you think you have taken too much of this medicine contact a poison control center or emergency room at once. NOTE: This medicine is only for you. Do not share this medicine with others. What if I miss a dose? If you miss a dose, take it as soon as you can. If it is almost time for your next dose, take only that dose. Do not take double or extra doses. What may interact with this medicine? -acetaminophen -cyclosporine -digoxin -medicines for blood pressure -medicines for diabetes, including insulin -medicines for hay fever and other allergies -medicines for depression, especially an Monoamine Oxidase Inhibitor (MAOI) -medicines for Parkinson's disease, like levodopa -medicines for sleep or for pain -tetracycline This list may not describe all possible interactions. Give your health care provider a list of all the medicines, herbs, non-prescription drugs, or dietary supplements you use. Also tell them if you smoke, drink alcohol, or use illegal drugs. Some items may interact with your medicine. What should I watch for while using this medicine? It may take a few weeks for your stomach condition to start to get better. However, do not take this medicine for longer than 12 weeks. The longer you take this medicine, and the more you take it, the greater your chances are of developing serious side effects. If you are an elderly patient, a female patient, or you have diabetes, you may be at an increased risk for side effects from this medicine. Contact your doctor immediately if you start having movements you cannot control such as lip smacking, rapid movements of the tongue, involuntary or uncontrollable movements of the eyes, head, arms and legs, or muscle twitches and spasms. Patients and their  families should watch out for worsening depression or thoughts of suicide. Also watch out for any sudden or severe changes in feelings such as feeling anxious, agitated, panicky, irritable, hostile, aggressive, impulsive, severely restless, overly excited and hyperactive, or not being able to sleep. If  this happens, especially at the beginning of treatment or after a change in dose, call your doctor. Do not treat yourself for high fever. Ask your doctor or health care professional for advice. You may get drowsy or dizzy. Do not drive, use machinery, or do anything that needs mental alertness until you know how this drug affects you. Do not stand or sit up quickly, especially if you are an older patient. This reduces the risk of dizzy or fainting spells. Alcohol can make you more drowsy and dizzy. Avoid alcoholic drinks. What side effects may I notice from receiving this medicine? Side effects that you should report to your doctor or health care professional as soon as possible: -allergic reactions like skin rash, itching or hives, swelling of the face, lips, or tongue -abnormal production of milk in females -breast enlargement in both males and females -change in the way you walk -difficulty moving, speaking or swallowing -drooling, lip smacking, or rapid movements of the tongue -excessive sweating -fever -involuntary or uncontrollable movements of the eyes, head, arms and legs -irregular heartbeat or palpitations -muscle twitches and spasms -unusually weak or tired Side effects that usually do not require medical attention (report to your doctor or health care professional if they continue or are bothersome): -change in sex drive or performance -depressed mood -diarrhea -difficulty sleeping -headache -menstrual changes -restless or nervous This list may not describe all possible side effects. Call your doctor for medical advice about side effects. You may report side effects to FDA at  1-800-FDA-1088. Where should I keep my medicine? Keep out of the reach of children. Store at room temperature between 20 and 25 degrees C (68 and 77 degrees F). Protect from light. Keep container tightly closed. Throw away any unused medicine after the expiration date. NOTE: This sheet is a summary. It may not cover all possible information. If you have questions about this medicine, talk to your doctor, pharmacist, or health care provider.  2013, Elsevier/Gold Standard. (01/21/2012 1:04:38 PM) Prochlorperazine tablets What is this medicine? PROCHLORPERAZINE (proe klor PER a zeen) helps to control severe nausea and vomiting. This medicine is also used to treat schizophrenia. It can also help patients who experience anxiety that is not due to psychological illness. This medicine may be used for other purposes; ask your health care provider or pharmacist if you have questions. What should I tell my health care provider before I take this medicine? They need to know if you have any of these conditions: -blood disorders or disease -dementia -liver disease or jaundice -Parkinson's disease -uncontrollable movement disorder -an unusual or allergic reaction to prochlorperazine, other medicines, foods, dyes, or preservatives -pregnant or trying to get pregnant -breast-feeding How should I use this medicine? Take this medicine by mouth with a glass of water. Follow the directions on the prescription label. Take your doses at regular intervals. Do not take your medicine more often than directed. Do not stop taking this medicine suddenly. This can cause nausea, vomiting, and dizziness. Ask your doctor or health care professional for advice. Talk to your pediatrician regarding the use of this medicine in children. Special care may be needed. While this drug may be prescribed for children as young as 2 years for selected conditions, precautions do apply. Overdosage: If you think you have taken too much of  this medicine contact a poison control center or emergency room at once. NOTE: This medicine is only for you. Do not share this medicine with others. What if I miss a  dose? If you miss a dose, take it as soon as you can. If it is almost time for your next dose, take only that dose. Do not take double or extra doses. What may interact with this medicine? Do not take this medicine with any of the following medications: -amoxapine -antidepressants like citalopram, escitalopram, fluoxetine, paroxetine, and sertraline -deferoxamine -dofetilide -maprotiline -tricyclic antidepressants like amitriptyline, clomipramine, imipramine, nortiptyline and others This medicine may also interact with the following medications: -lithium -medicines for pain -phenytoin -propranolol -warfarin This list may not describe all possible interactions. Give your health care provider a list of all the medicines, herbs, non-prescription drugs, or dietary supplements you use. Also tell them if you smoke, drink alcohol, or use illegal drugs. Some items may interact with your medicine. What should I watch for while using this medicine? Visit your doctor or health care professional for regular checks on your progress. You may get drowsy or dizzy. Do not drive, use machinery, or do anything that needs mental alertness until you know how this medicine affects you. Do not stand or sit up quickly, especially if you are an older patient. This reduces the risk of dizzy or fainting spells. Alcohol may interfere with the effect of this medicine. Avoid alcoholic drinks. This medicine can reduce the response of your body to heat or cold. Dress warm in cold weather and stay hydrated in hot weather. If possible, avoid extreme temperatures like saunas, hot tubs, very hot or cold showers, or activities that can cause dehydration such as vigorous exercise. This medicine can make you more sensitive to the sun. Keep out of the sun. If you  cannot avoid being in the sun, wear protective clothing and use sunscreen. Do not use sun lamps or tanning beds/booths. Your mouth may get dry. Chewing sugarless gum or sucking hard candy, and drinking plenty of water may help. Contact your doctor if the problem does not go away or is severe. What side effects may I notice from receiving this medicine? Side effects that you should report to your doctor or health care professional as soon as possible: -blurred vision -breast enlargement in men or women -breast milk in women who are not breast-feeding -chest pain, fast or irregular heartbeat -confusion, restlessness -dark yellow or brown urine -difficulty breathing or swallowing -dizziness or fainting spells -drooling, shaking, movement difficulty (shuffling walk) or rigidity -fever, chills, sore throat -involuntary or uncontrollable movements of the eyes, mouth, head, arms, and legs -seizures -stomach area pain -unusually weak or tired -unusual bleeding or bruising -yellowing of skin or eyes Side effects that usually do not require medical attention (report to your doctor or health care professional if they continue or are bothersome): -difficulty passing urine -difficulty sleeping -headache -sexual dysfunction -skin rash, or itching This list may not describe all possible side effects. Call your doctor for medical advice about side effects. You may report side effects to FDA at 1-800-FDA-1088. Where should I keep my medicine? Keep out of the reach of children. Store at room temperature between 15 and 30 degrees C (59 and 86 degrees F). Protect from light. Throw away any unused medicine after the expiration date. NOTE: This sheet is a summary. It may not cover all possible information. If you have questions about this medicine, talk to your doctor, pharmacist, or health care provider.  2013, Elsevier/Gold Standard. (02/11/2012 4:59:39 PM) MUGA Testing The medical term for the MUGA test  is Electrocardiographic Multi-Gated Acquisition Study with First Pass Radionuclide Angiography (MUGA). MEANING OF  TEST  The purpose of this test to check on how your heart's ventricles are working. It does this by looking at the muscle motion of the heart muscle while it is beating. The ventricles are the large muscular chambers in the lower part of your heart that pump blood to your lungs (right ventricle) and to the rest of your body (left ventricle). The test also checks the ejection fraction of the heart. This is the amount of blood that is being pushed out by the heart. The purpose of a MUGA study is to evaluate right and left ventricular wall motion and ejection fraction. There is no preparation for this test. NORMAL FINDINGS  Normal myocardial ejection fraction and coronary perfusion. Ranges for normal findings may vary among different laboratories and hospitals. You should always check with your doctor after having lab work or other tests done to discuss the meaning of your test results and whether your values are considered within normal limits. OBTAINING THE TEST RESULTS It is your responsibility to obtain your test results. Ask the lab or department performing the test when and how you will get your results. Document Released: 11/12/2005 Document Revised: 12/16/2011 Document Reviewed: 01/08/2007 [facility] Patient Information 2014 [place], [place].

## 2013-07-01 LAB — CANCER ANTIGEN 19-9: CA 19-9: 9.8 U/mL — ABNORMAL LOW (ref <35.0)

## 2013-07-05 ENCOUNTER — Encounter (HOSPITAL_BASED_OUTPATIENT_CLINIC_OR_DEPARTMENT_OTHER): Payer: Medicare Other

## 2013-07-05 ENCOUNTER — Encounter (HOSPITAL_COMMUNITY)
Admission: RE | Admit: 2013-07-05 | Discharge: 2013-07-05 | Disposition: A | Discharge status: Home / Self Care | Payer: Medicare Other | Source: Ambulatory Visit | Attending: Hematology and Oncology | Admitting: Hematology and Oncology

## 2013-07-05 ENCOUNTER — Other Ambulatory Visit (HOSPITAL_COMMUNITY): Payer: Self-pay | Admitting: Hematology and Oncology

## 2013-07-05 ENCOUNTER — Encounter (HOSPITAL_COMMUNITY): Payer: Self-pay

## 2013-07-05 VITALS — BP 139/93 | HR 87 | Temp 99.1°F | Resp 20

## 2013-07-05 DIAGNOSIS — C169 Malignant neoplasm of stomach, unspecified: Secondary | ICD-10-CM

## 2013-07-05 DIAGNOSIS — R300 Dysuria: Secondary | ICD-10-CM | POA: Insufficient documentation

## 2013-07-05 DIAGNOSIS — K219 Gastro-esophageal reflux disease without esophagitis: Secondary | ICD-10-CM

## 2013-07-05 DIAGNOSIS — E663 Overweight: Secondary | ICD-10-CM

## 2013-07-05 DIAGNOSIS — I10 Essential (primary) hypertension: Secondary | ICD-10-CM

## 2013-07-05 DIAGNOSIS — D3501 Benign neoplasm of right adrenal gland: Secondary | ICD-10-CM

## 2013-07-05 LAB — URINALYSIS, ROUTINE W REFLEX MICROSCOPIC
Bilirubin Urine: NEGATIVE
Glucose, UA: NEGATIVE mg/dL
Nitrite: NEGATIVE
Specific Gravity, Urine: 1.015 (ref 1.005–1.030)
Urobilinogen, UA: 0.2 mg/dL (ref 0.0–1.0)
pH: 7 (ref 5.0–8.0)

## 2013-07-05 MED ORDER — OXYCODONE HCL 5 MG PO TABS: 10.0000 mg | ORAL_TABLET | ORAL | Status: DC | PRN

## 2013-07-05 MED ORDER — HEPARIN SOD (PORK) LOCK FLUSH 100 UNIT/ML IV SOLN
INTRAVENOUS | Status: AC
  Filled 2013-07-05: qty 5

## 2013-07-05 MED ORDER — SODIUM PERTECHNETATE TC 99M INJECTION: 25.0000 | Freq: Once | INTRAVENOUS | Status: AC | PRN

## 2013-07-05 MED ORDER — TECHNETIUM TC 99M-LABELED RED BLOOD CELLS IV KIT
25.0000 | PACK | Freq: Once | INTRAVENOUS | Status: AC | PRN
  Administered 2013-07-05: 25 via INTRAVENOUS

## 2013-07-05 NOTE — Progress Notes (Signed)
Chemo teaching done and consent signed for Cisplatin/Epirubicin/69fu. Med and treatment calendar reviewed/given to patient. Paperwork completed and signed for [facility] and faxed. Pt to receive flu shot with 1st chemo tx on 07/12/13. Order for flu shot under signed & held.

## 2013-07-05 NOTE — Addendum Note (Signed)
Addended by: Oda Kilts on: 07/05/2013 02:57 PM   Modules accepted: Orders

## 2013-07-06 ENCOUNTER — Telehealth (HOSPITAL_COMMUNITY): Payer: Self-pay | Admitting: Dietician

## 2013-07-06 ENCOUNTER — Ambulatory Visit (HOSPITAL_COMMUNITY): Payer: Medicare Other

## 2013-07-06 ENCOUNTER — Encounter (HOSPITAL_COMMUNITY): Payer: Self-pay | Admitting: *Deleted

## 2013-07-06 ENCOUNTER — Ambulatory Visit (HOSPITAL_COMMUNITY)
Admission: RE | Admit: 2013-07-06 | Discharge: 2013-07-06 | Disposition: A | Discharge status: Home / Self Care | Payer: Medicare Other | Source: Ambulatory Visit | Attending: General Surgery | Admitting: General Surgery

## 2013-07-06 ENCOUNTER — Encounter (HOSPITAL_COMMUNITY): Payer: Self-pay | Admitting: Anesthesiology

## 2013-07-06 ENCOUNTER — Other Ambulatory Visit (HOSPITAL_COMMUNITY): Payer: Self-pay | Admitting: Oncology

## 2013-07-06 ENCOUNTER — Ambulatory Visit (HOSPITAL_COMMUNITY): Payer: Medicare Other | Admitting: Anesthesiology

## 2013-07-06 ENCOUNTER — Encounter (HOSPITAL_COMMUNITY)
Admission: RE | Disposition: A | Discharge status: Home / Self Care | Payer: Self-pay | Source: Ambulatory Visit | Attending: General Surgery

## 2013-07-06 DIAGNOSIS — C169 Malignant neoplasm of stomach, unspecified: Secondary | ICD-10-CM

## 2013-07-06 DIAGNOSIS — I471 Supraventricular tachycardia, unspecified: Secondary | ICD-10-CM | POA: Diagnosis present

## 2013-07-06 DIAGNOSIS — Z9089 Acquired absence of other organs: Secondary | ICD-10-CM | POA: Insufficient documentation

## 2013-07-06 DIAGNOSIS — Z806 Family history of leukemia: Secondary | ICD-10-CM

## 2013-07-06 DIAGNOSIS — I10 Essential (primary) hypertension: Secondary | ICD-10-CM | POA: Insufficient documentation

## 2013-07-06 DIAGNOSIS — K219 Gastro-esophageal reflux disease without esophagitis: Secondary | ICD-10-CM | POA: Insufficient documentation

## 2013-07-06 DIAGNOSIS — Z86718 Personal history of other venous thrombosis and embolism: Secondary | ICD-10-CM

## 2013-07-06 DIAGNOSIS — D630 Anemia in neoplastic disease: Secondary | ICD-10-CM | POA: Diagnosis present

## 2013-07-06 DIAGNOSIS — T82898A Other specified complication of vascular prosthetic devices, implants and grafts, initial encounter: Secondary | ICD-10-CM | POA: Diagnosis not present

## 2013-07-06 DIAGNOSIS — Z8249 Family history of ischemic heart disease and other diseases of the circulatory system: Secondary | ICD-10-CM

## 2013-07-06 DIAGNOSIS — D3501 Benign neoplasm of right adrenal gland: Secondary | ICD-10-CM

## 2013-07-06 DIAGNOSIS — E8809 Other disorders of plasma-protein metabolism, not elsewhere classified: Secondary | ICD-10-CM | POA: Diagnosis present

## 2013-07-06 DIAGNOSIS — M129 Arthropathy, unspecified: Secondary | ICD-10-CM | POA: Diagnosis present

## 2013-07-06 DIAGNOSIS — Z96659 Presence of unspecified artificial knee joint: Secondary | ICD-10-CM

## 2013-07-06 DIAGNOSIS — K668 Other specified disorders of peritoneum: Secondary | ICD-10-CM | POA: Diagnosis present

## 2013-07-06 DIAGNOSIS — E7800 Pure hypercholesterolemia, unspecified: Secondary | ICD-10-CM | POA: Insufficient documentation

## 2013-07-06 DIAGNOSIS — K659 Peritonitis, unspecified: Secondary | ICD-10-CM | POA: Diagnosis present

## 2013-07-06 DIAGNOSIS — K255 Chronic or unspecified gastric ulcer with perforation: Principal | ICD-10-CM | POA: Diagnosis present

## 2013-07-06 DIAGNOSIS — Z96649 Presence of unspecified artificial hip joint: Secondary | ICD-10-CM

## 2013-07-06 DIAGNOSIS — D62 Acute posthemorrhagic anemia: Secondary | ICD-10-CM | POA: Diagnosis present

## 2013-07-06 DIAGNOSIS — K3184 Gastroparesis: Secondary | ICD-10-CM | POA: Diagnosis present

## 2013-07-06 DIAGNOSIS — Z79899 Other long term (current) drug therapy: Secondary | ICD-10-CM | POA: Insufficient documentation

## 2013-07-06 DIAGNOSIS — D500 Iron deficiency anemia secondary to blood loss (chronic): Secondary | ICD-10-CM | POA: Diagnosis present

## 2013-07-06 DIAGNOSIS — Z01818 Encounter for other preprocedural examination: Secondary | ICD-10-CM | POA: Insufficient documentation

## 2013-07-06 DIAGNOSIS — F17200 Nicotine dependence, unspecified, uncomplicated: Secondary | ICD-10-CM | POA: Insufficient documentation

## 2013-07-06 DIAGNOSIS — I498 Other specified cardiac arrhythmias: Secondary | ICD-10-CM | POA: Diagnosis present

## 2013-07-06 DIAGNOSIS — C786 Secondary malignant neoplasm of retroperitoneum and peritoneum: Secondary | ICD-10-CM | POA: Diagnosis present

## 2013-07-06 DIAGNOSIS — Y849 Medical procedure, unspecified as the cause of abnormal reaction of the patient, or of later complication, without mention of misadventure at the time of the procedure: Secondary | ICD-10-CM | POA: Diagnosis not present

## 2013-07-06 HISTORY — PX: PORTACATH PLACEMENT: SHX2246

## 2013-07-06 LAB — URINALYSIS, ROUTINE W REFLEX MICROSCOPIC
Bilirubin Urine: NEGATIVE
Hgb urine dipstick: NEGATIVE
Protein, ur: NEGATIVE mg/dL
Specific Gravity, Urine: 1.019 (ref 1.005–1.030)
Urobilinogen, UA: 1 mg/dL (ref 0.0–1.0)

## 2013-07-06 LAB — URINE MICROSCOPIC-ADD ON

## 2013-07-06 SURGERY — INSERTION, TUNNELED CENTRAL VENOUS DEVICE, WITH PORT
Anesthesia: General | Site: Chest | Laterality: Left | Wound class: Clean

## 2013-07-06 MED ORDER — CEFAZOLIN SODIUM-DEXTROSE 2-3 GM-% IV SOLR
INTRAVENOUS | Status: AC
  Filled 2013-07-06: qty 50

## 2013-07-06 MED ORDER — FENTANYL CITRATE 0.05 MG/ML IJ SOLN: 25.0000 ug | INTRAMUSCULAR | Status: DC | PRN

## 2013-07-06 MED ORDER — PHENYLEPHRINE HCL 10 MG/ML IJ SOLN
INTRAMUSCULAR | Status: DC | PRN
  Administered 2013-07-06: 40 ug via INTRAVENOUS

## 2013-07-06 MED ORDER — BUPIVACAINE HCL (PF) 0.25 % IJ SOLN
INTRAMUSCULAR | Status: DC | PRN
  Administered 2013-07-06: 12.5 mL
  Administered 2013-07-06: 5 mL

## 2013-07-06 MED ORDER — HEPARIN SOD (PORK) LOCK FLUSH 100 UNIT/ML IV SOLN
INTRAVENOUS | Status: DC | PRN
  Administered 2013-07-06: 1000 [IU]

## 2013-07-06 MED ORDER — ONDANSETRON HCL 4 MG/2ML IJ SOLN: 4.0000 mg | Freq: Four times a day (QID) | INTRAMUSCULAR | Status: DC | PRN

## 2013-07-06 MED ORDER — PROMETHAZINE HCL 25 MG/ML IJ SOLN: 6.2500 mg | INTRAMUSCULAR | Status: DC | PRN

## 2013-07-06 MED ORDER — PROPOFOL 10 MG/ML IV BOLUS
INTRAVENOUS | Status: DC | PRN
  Administered 2013-07-06: 100 mg via INTRAVENOUS
  Administered 2013-07-06: 30 mg via INTRAVENOUS

## 2013-07-06 MED ORDER — SODIUM CHLORIDE 0.9 % IR SOLN
Freq: Once | Status: AC
  Administered 2013-07-06: 08:00:00
  Filled 2013-07-06: qty 1.2

## 2013-07-06 MED ORDER — ACETAMINOPHEN 325 MG PO TABS: 650.0000 mg | ORAL_TABLET | ORAL | Status: DC | PRN

## 2013-07-06 MED ORDER — ACETAMINOPHEN 650 MG RE SUPP
650.0000 mg | RECTAL | Status: DC | PRN
  Filled 2013-07-06: qty 1

## 2013-07-06 MED ORDER — LACTATED RINGERS IV SOLN
INTRAVENOUS | Status: DC | PRN
  Administered 2013-07-06: 07:00:00 via INTRAVENOUS

## 2013-07-06 MED ORDER — CHLORHEXIDINE GLUCONATE 4 % EX LIQD
1.0000 "application " | Freq: Once | CUTANEOUS | Status: DC
  Filled 2013-07-06: qty 15

## 2013-07-06 MED ORDER — HEPARIN SOD (PORK) LOCK FLUSH 100 UNIT/ML IV SOLN
INTRAVENOUS | Status: AC
  Filled 2013-07-06: qty 5

## 2013-07-06 MED ORDER — SODIUM CHLORIDE 0.9 % IV SOLN: 250.0000 mL | INTRAVENOUS | Status: DC | PRN

## 2013-07-06 MED ORDER — PROMETHAZINE HCL 25 MG/ML IJ SOLN
INTRAMUSCULAR | Status: AC
  Filled 2013-07-06: qty 1

## 2013-07-06 MED ORDER — FENTANYL CITRATE 0.05 MG/ML IJ SOLN
INTRAMUSCULAR | Status: DC | PRN
  Administered 2013-07-06: 25 ug via INTRAVENOUS

## 2013-07-06 MED ORDER — ONDANSETRON HCL 4 MG/2ML IJ SOLN
INTRAMUSCULAR | Status: DC | PRN
  Administered 2013-07-06: 4 mg via INTRAVENOUS

## 2013-07-06 MED ORDER — OXYCODONE HCL 5 MG PO TABS: 5.0000 mg | ORAL_TABLET | ORAL | Status: DC | PRN

## 2013-07-06 MED ORDER — DEXAMETHASONE SODIUM PHOSPHATE 10 MG/ML IJ SOLN
INTRAMUSCULAR | Status: DC | PRN
  Administered 2013-07-06: 10 mg via INTRAVENOUS

## 2013-07-06 MED ORDER — SODIUM CHLORIDE 0.9 % IJ SOLN: 3.0000 mL | INTRAMUSCULAR | Status: DC | PRN

## 2013-07-06 MED ORDER — CEFAZOLIN SODIUM-DEXTROSE 2-3 GM-% IV SOLR
2.0000 g | INTRAVENOUS | Status: AC
  Administered 2013-07-06: 2 g via INTRAVENOUS

## 2013-07-06 MED ORDER — LIDOCAINE-EPINEPHRINE (PF) 1 %-1:200000 IJ SOLN
INTRAMUSCULAR | Status: DC | PRN
  Administered 2013-07-06: 5 mL
  Administered 2013-07-06: 12.5 mL

## 2013-07-06 MED ORDER — LIDOCAINE HCL (CARDIAC) 20 MG/ML IV SOLN
INTRAVENOUS | Status: DC | PRN
  Administered 2013-07-06: 50 mg via INTRAVENOUS

## 2013-07-06 MED ORDER — SODIUM CHLORIDE 0.9 % IJ SOLN: 3.0000 mL | Freq: Two times a day (BID) | INTRAMUSCULAR | Status: DC

## 2013-07-06 SURGICAL SUPPLY — 42 items
ADH SKN CLS APL DERMABOND .7 (GAUZE/BANDAGES/DRESSINGS) ×1
BLADE HEX COATED 2.75 (ELECTRODE) ×2 IMPLANT
BLADE SURG 15 STRL LF DISP TIS (BLADE) ×1 IMPLANT
BLADE SURG 15 STRL SS (BLADE) ×2
BLADE SURG SZ11 CARB STEEL (BLADE) ×2 IMPLANT
CANISTER SUCTION 2500CC (MISCELLANEOUS) ×2 IMPLANT
CHLORAPREP W/TINT 26ML (MISCELLANEOUS) ×2 IMPLANT
CLOTH BEACON ORANGE TIMEOUT ST (SAFETY) ×2 IMPLANT
DECANTER SPIKE VIAL GLASS SM (MISCELLANEOUS) ×4 IMPLANT
DERMABOND ADVANCED (GAUZE/BANDAGES/DRESSINGS) ×1
DERMABOND ADVANCED .7 DNX12 (GAUZE/BANDAGES/DRESSINGS) ×1 IMPLANT
DRAPE C-ARM 42X120 X-RAY (DRAPES) ×2 IMPLANT
DRAPE PED LAPAROTOMY (DRAPES) ×2 IMPLANT
DRAPE UTILITY W/TAPE 26X15 (DRAPES) ×2 IMPLANT
DRSG TEGADERM 4X4.75 (GAUZE/BANDAGES/DRESSINGS) ×4 IMPLANT
DRSG TEGADERM 6X8 (GAUZE/BANDAGES/DRESSINGS) ×2 IMPLANT
ELECT REM PT RETURN 9FT ADLT (ELECTROSURGICAL) ×2
ELECTRODE REM PT RTRN 9FT ADLT (ELECTROSURGICAL) ×1 IMPLANT
GAUZE SPONGE 4X4 16PLY XRAY LF (GAUZE/BANDAGES/DRESSINGS) ×2 IMPLANT
GLOVE BIO SURGEON STRL SZ 6 (GLOVE) ×4 IMPLANT
GLOVE BIOGEL PI IND STRL 6.5 (GLOVE) ×1 IMPLANT
GLOVE BIOGEL PI IND STRL 7.0 (GLOVE) ×1 IMPLANT
GLOVE BIOGEL PI INDICATOR 6.5 (GLOVE) ×1
GLOVE BIOGEL PI INDICATOR 7.0 (GLOVE) ×1
GLOVE INDICATOR 6.5 STRL GRN (GLOVE) ×4 IMPLANT
GOWN PREVENTION PLUS XXLARGE (GOWN DISPOSABLE) ×2 IMPLANT
KIT BASIN OR (CUSTOM PROCEDURE TRAY) ×2 IMPLANT
KIT PORT POWER 8FR ISP CVUE (Catheter) ×2 IMPLANT
KIT POWER CATH 8FR (Catheter) IMPLANT
NEEDLE HYPO 22GX1.5 SAFETY (NEEDLE) ×2 IMPLANT
NEEDLE HYPO 25X1 1.5 SAFETY (NEEDLE) IMPLANT
PACK UNIVERSAL I (CUSTOM PROCEDURE TRAY) ×2 IMPLANT
PENCIL BUTTON HOLSTER BLD 10FT (ELECTRODE) ×2 IMPLANT
SPONGE GAUZE 4X4 12PLY (GAUZE/BANDAGES/DRESSINGS) ×2 IMPLANT
SUT MNCRL AB 4-0 PS2 18 (SUTURE) ×2 IMPLANT
SUT PROLENE 2 0 SH DA (SUTURE) ×4 IMPLANT
SUT VIC AB 3-0 SH 27 (SUTURE) ×4
SUT VIC AB 3-0 SH 27XBRD (SUTURE) ×2 IMPLANT
SYR BULB IRRIGATION 50ML (SYRINGE) IMPLANT
SYR CONTROL 10ML LL (SYRINGE) ×2 IMPLANT
TOWEL OR 17X26 10 PK STRL BLUE (TOWEL DISPOSABLE) ×2 IMPLANT
YANKAUER SUCT BULB TIP 10FT TU (MISCELLANEOUS) IMPLANT

## 2013-07-06 NOTE — Anesthesia Preprocedure Evaluation (Signed)
Anesthesia Evaluation  Patient identified by MRN, date of birth, ID band Patient awake    Reviewed: Allergy & Precautions, H&P , NPO status , Patient's Chart, lab work & pertinent test results  Airway Mallampati: II TM Distance: >3 FB Neck ROM: Full    Dental no notable dental hx.    Pulmonary Current Smoker,  breath sounds clear to auscultation  Pulmonary exam normal       Cardiovascular hypertension, + dysrhythmias Supra Ventricular Tachycardia Rhythm:Regular Rate:Normal     Neuro/Psych negative neurological ROS  negative psych ROS   GI/Hepatic Neg liver ROS, GERD-  ,  Endo/Other  negative endocrine ROS  Renal/GU negative Renal ROS  negative genitourinary   Musculoskeletal negative musculoskeletal ROS (+)   Abdominal   Peds negative pediatric ROS (+)  Hematology negative hematology ROS (+)   Anesthesia Other Findings   Reproductive/Obstetrics negative OB ROS                           Anesthesia Physical Anesthesia Plan  ASA: III  Anesthesia Plan: General   Post-op Pain Management:    Induction: Intravenous  Airway Management Planned: LMA  Additional Equipment:   Intra-op Plan:   Post-operative Plan:   Informed Consent: I have reviewed the patients History and Physical, chart, labs and discussed the procedure including the risks, benefits and alternatives for the proposed anesthesia with the patient or authorized representative who has indicated his/her understanding and acceptance.   Dental advisory given  Plan Discussed with: CRNA and Surgeon  Anesthesia Plan Comments:         Anesthesia Quick Evaluation

## 2013-07-06 NOTE — Anesthesia Postprocedure Evaluation (Signed)
  Anesthesia Post-op Note  Patient: [name]  Procedure(s) Performed: Procedure(s) (LRB): INSERTION PORT-A-CATH (Left)  Patient Location: PACU  Anesthesia Type: General  Level of Consciousness: awake and alert   Airway and Oxygen Therapy: Patient Spontanous Breathing  Post-op Pain: mild  Post-op Assessment: Post-op Vital signs reviewed, Patient's Cardiovascular Status Stable, Respiratory Function Stable, Patent Airway and No signs of Nausea or vomiting  Last Vitals:  Filed Vitals:   07/06/13 0900  BP: 162/76  Pulse: 74  Temp:   Resp: 17    Post-op Vital Signs: stable   Complications: No apparent anesthesia complications

## 2013-07-06 NOTE — H&P (View-Only) (Signed)
Chief Complaint  Patient presents with  . New Evaluation    ev newly dx adenocarcinoma    HISTORY: Patient is a 72 year old female who presents with malaise, weight loss, decreased appetite, and left upper quadrant abdominal pain.  She has lost around 30 pounds. She describes her weight loss as a result of multiple factors. She states that her appetite is decreased, her taste buds are not functioning properly, and she has pain and feels bad after she eats.    Past Medical History  Diagnosis Date  . Hypertension   . Paroxysmal SVT (supraventricular tachycardia)   . Hypercholesteremia   . GERD (gastroesophageal reflux disease)   . PUD (peptic ulcer disease)   . DVT (deep venous thrombosis) 2007  . Adrenal adenoma     left  . Thyroid nodule     left  . H. pylori infection   . Diverticulosis   . 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 Diarrhea     recurrent  . Arthritis   . Hiatal hernia   . Cancer     adenocarcinoma    Past Surgical History  Procedure Laterality Date  . Total knee arthroplasty  bilateral knee  . Hip  replacement  left  . Appendectomy    . Cholecystectomy    . Abdominal hysterectomy    . Breast lumpectomy      left  . Ablation      of SVT  . Cardiac catheterization  2009    normal coronary arteries  . Colonoscopy  09/13/2009    ZOX:[id number] internal hemorrhoids  . Esophagogastroduodenoscopy  09/13/2009    FAO:ZHYQMV/  . Esophagogastroduodenoscopy N/A 06/11/2013    Procedure: ESOPHAGOGASTRODUODENOSCOPY (EGD);  Surgeon: West Bali, MD;  Location: [facility];  Service: Endoscopy;  Laterality: N/A;  2:15-moved to 1045 [name] notified pt    Current Outpatient Prescriptions  Medication Sig Dispense Refill  . ALPRAZolam (XANAX) 0.25 MG tablet Take 0.25 mg by mouth at bedtime as needed for sleep.      Marland Kitchen dicyclomine (BENTYL) 20 MG tablet Take 20 mg by mouth 2 (two) times daily at 8 am and 10 pm.      .  diltiazem (TIAZAC) 360 MG 24 hr capsule Take 360 mg by mouth every morning.       . fish oil-omega-3 fatty acids 1000 MG capsule Take 1 g by mouth daily.      . Linaclotide (LINZESS) 145 MCG CAPS capsule Take 145 mcg by mouth daily.      Marland Kitchen lovastatin (MEVACOR) 40 MG tablet Take 2 tablets (80 mg total) by mouth at bedtime.  60 tablet  11  . metoCLOPramide (REGLAN) 10 MG tablet Take 1 tablet (10 mg total) by mouth 4 (four) times daily.  100 tablet  3  . metoprolol (TOPROL-XL) 50 MG 24 hr tablet Take 50 mg by mouth every morning.       Marland Kitchen oxyCODONE (OXY IR/ROXICODONE) 5 MG immediate release tablet Take 2 tablets (10 mg total) by mouth every 2 (two) hours as needed for pain.  120 tablet  0  . pantoprazole (PROTONIX) 40 MG tablet Take 40 mg by mouth 2 (two) times daily.        . Probiotic Product (PROBIOTIC FORMULA PO) Take 1 tablet by mouth every morning.      . prochlorperazine (COMPAZINE) 10 MG tablet Take 1 tablet (10 mg total) by mouth every 6 (six) hours.  100 tablet  3  . sucralfate (CARAFATE) 1 G tablet Take 1 tablet (1 g total) by mouth 4 (four) times daily -  with meals and at bedtime.  60 tablet  2   No current facility-administered medications for this visit.     No Known Allergies   Family History  Problem Relation Age of Onset  . Colon cancer Neg Hx   . Cancer Mother     leukemia  . Cancer Sister     breast  . Heart disease Brother   . Heart disease Sister      History   Social History  . Marital Status: Widowed    Spouse Name: N/A    Number of Children: N/A  . Years of Education: N/A   Social History Main Topics  . Smoking status: Current Every Day Smoker -- 0.25 packs/day for 15 years  . Smokeless tobacco: Never Used     Comment: Quit smoking x 3 weeks  . Alcohol Use: Yes     Comment: occasionally  . Drug Use: No  . Sexual Activity: None   Other Topics Concern  . None   Social History Narrative   HUSBAND PASSES MAR 2014. EATS OUT A LOT.     REVIEW OF  SYSTEMS - PERTINENT POSITIVES ONLY: 12 point review of systems negative other than HPI and PMH except for chills, weight loss, nasal congestion, cough, chest pain, palpitations, abdominal pain and distention, vomiting, headaches, weakness.  EXAM: Filed Vitals:   06/28/13 0835  BP: 134/80  Pulse: 92  Temp: 97.4 F (36.3 C)  Resp: 14   Filed Weights   06/28/13 0835  Weight: 194 lb 9.6 oz (88.27 kg)     Gen:  No acute distress.  Well nourished and well groomed.   Neurological: Alert and oriented to person, place, and time. Coordination normal.  Head: Normocephalic and atraumatic.  Eyes: Conjunctivae are normal. Pupils are equal, round, and reactive to light. No scleral icterus.  Neck: Normal range of motion. Neck supple. No tracheal deviation or thyromegaly present.  Cardiovascular: Normal rate, regular rhythm, normal heart sounds and intact distal pulses.  Exam reveals no gallop and no friction rub.  No murmur heard. Respiratory: Effort normal.  No respiratory distress. No chest wall tenderness. Breath sounds normal.  No wheezes, rales or rhonchi.  GI: Soft. Bowel sounds are normal. The abdomen is soft.  It is mildly distended.  She has significant LUQ tenderness.   There is no rebound and no guarding.  Musculoskeletal: Normal range of motion. Extremities are nontender.  Lymphadenopathy: No cervical, preauricular, postauricular or axillary adenopathy is present Skin: Skin is warm and dry. No rash noted. No diaphoresis. No erythema. No pallor. No clubbing, cyanosis, or edema.   Psychiatric: Normal mood and affect. Behavior is normal. Judgment and thought content normal.    LABORATORY RESULTS: Available labs are reviewed  Mild hypokalemia, mild hyperglycemia.  Cr. 0.82, HCT 36.9, other labs essentially normal.   Pathology 1. 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 2. Stomach, biopsy, gastric ulcer - INVASIVE POORLY  DIFFERENTIATED ADENOCARCINOMA WITH SIGNET RING CELL FEATURES.  RADIOLOGY RESULTS: See E-Chart or I-Site for most recent results.  Images and reports are reviewed. PET IMPRESSION:  Abnormal F D G uptake associated with concentric mass-like  thickening of the gastric cardia/greater curvature, compatible with  the given diagnosis of adenocarcinoma. No FDG avid metastatic  disease within the neck, chest, abdomen, or pelvis.    ASSESSMENT AND  PLAN: Adenocarcinoma of stomach Patient is a 72 year old female with new diagnosis of gastric cancer. This appears to be a fairly sizable mass based on the imaging available from her endoscopy.  Given the size and the proximity to the GE junction, I recommend neoadjuvant treatment prior to surgery.  Also, given the size of the tumor she is increased likelihood of micrometastatic disease.   She also requires referral to nutrition. She has lost 30 pounds and needs to increase her protein stores prior to proximal versus total gastrectomy.  I stressed to the patient that doing surgery does not make her immediately for better in terms of her pain and issues with appetite and nausea. There is a significant recovery period rom surgery and this can really inhibit proceeding with chemotherapy afterwards.  I briefly discussed anatomy and surgical options. I also discussed that if metastatic disease was located, that she would not be a surgical candidate in any event.  I have sent a message to her oncologist at [facility] regarding this plan. I will see her back toward the end of chemotherapy to schedule surgery.  30 min spent in counseling.       Maudry Diego MD Surgical Oncology, General and Endocrine Surgery [facility]      Visit Diagnoses: 1. Adenocarcinoma of stomach     Primary Care Physician: Evlyn Courier, MD

## 2013-07-06 NOTE — Op Note (Signed)
PREOPERATIVE DIAGNOSIS:  Gastric cancer     POSTOPERATIVE DIAGNOSIS:  Same     PROCEDURE: left  subclavian port placement, Bard ClearVue  Power Port, MRI safe, 8-French.      SURGEON:  Almond Lint, MD      ANESTHESIA:  General   FINDINGS:  Good venous return, easy flush, and tip of the catheter and   SVC 22.5 cm.      SPECIMEN:  None.      ESTIMATED BLOOD LOSS:  Minimal.      COMPLICATIONS:  None known.      PROCEDURE:  Pt was identified in the holding area and taken to   the operating room, where patient was placed supine on the operating room   table.  General anesthesia was induced.  Patient's arms were tucked and the upper chest and neck were prepped and draped in sterile fashion.  Time-out was   performed according to the surgical safety check list.  When all was   correct, we continued.   Local anesthetic was administered over this   area at the angle of the clavicle.  The vein was accessed with 2 passes of the needle. There was good venous return and the wire passed easily with no ectopy.   Fluoroscopy was used to confirm that the wire was in the vena cava.      The patient was placed back level and the area for the pocket was anethetized   with local anesthetic.  A 3-cm transverse incision was made with a #15   blade.  Cautery was used to divide the subcutaneous tissues down to the   pectoralis muscle.  An [employer] retractor was used to elevate the skin   while a pocket was created on top of the pectoralis fascia.  The port   was placed into the pocket to confirm that it was of adequate size.  The   catheter was preattached to the port.  The port was then secured to the   pectoralis fascia with four 2-0 Prolene sutures.  These were clamped and   not tied down yet.    The catheter was tunneled through to the wire exit   site.  The catheter was placed along the wire to determine what length it should be to be in the SVC.  The catheter was cut at 22.5 cm.  The tunneler  sheath and dilator were passed over the wire and the dilator and wire were removed.  The catheter was advanced through the tunneler sheath and the tunneler sheath was pulled away.  Care was taken to keep the catheter in the tunneler sheath as this occurred. This was advanced and the tunneler sheath was removed.  There was good venous   return and easy flush of the catheter.  The Prolene sutures were tied   down to the pectoral fascia.  The skin was reapproximated using 3-0   Vicryl interrupted deep dermal sutures.    Fluoroscopy was used to re-confirm good position of the catheter.  The skin   was then closed using 4-0 Monocryl in a subcuticular fashion.  The port was flushed with concentrated heparin flush as well.  The wounds were then cleaned, dried, and dressed with Dermabond.  The patient was awakened from anesthesia and taken to the PACU in stable condition.  Needle, sponge, and instrument counts were correct.               Almond Lint, MD

## 2013-07-06 NOTE — Telephone Encounter (Signed)
Pt was a no-show for appointment scheduled for 07/06/2013 at 0900. Noted she was admitted to [facility] for port-a-cath insertion. Sent letter to pt home notifying pt of no-show and requesting rescheduling appointment.

## 2013-07-06 NOTE — Transfer of Care (Signed)
Immediate Anesthesia Transfer of Care Note  Patient: Sharon LASETER  Procedure(s) Performed: Procedure(s): INSERTION PORT-A-CATH (Left)  Patient Location: PACU  Anesthesia Type:General  Level of Consciousness: awake, alert  and oriented  Airway & Oxygen Therapy: Patient Spontanous Breathing and Patient connected to face mask oxygen  Post-op Assessment: Report given to PACU RN, Post -op Vital signs reviewed and stable and Patient moving all extremities  Post vital signs: stable  Complications: No apparent anesthesia complications

## 2013-07-06 NOTE — Interval H&P Note (Signed)
History and Physical Interval Note:  07/06/2013 7:25 AM  [name]  has presented today for surgery, with the diagnosis of gastric cancer   The various methods of treatment have been discussed with the patient and family. After consideration of risks, benefits and other options for treatment, the patient has consented to  Procedure(s): INSERTION PORT-A-CATH (N/A) as a surgical intervention .  The patient's history has been reviewed, patient examined, no change in status, stable for surgery.  I have reviewed the patient's chart and labs.  Questions were answered to the patient's satisfaction.     [name]

## 2013-07-06 NOTE — Progress Notes (Signed)
Phase 2 . immediately upon arrival to short stay from PACU pt ambulated to BR with minimal assist and voided moderate amount of clear yellow urine  Then back to bed drinking cola and crackers voices no c/o

## 2013-07-06 NOTE — Preoperative (Signed)
Beta Blockers   Reason not to administer Beta Blockers:Not Applicable 

## 2013-07-07 ENCOUNTER — Telehealth (INDEPENDENT_AMBULATORY_CARE_PROVIDER_SITE_OTHER): Payer: Self-pay | Admitting: *Deleted

## 2013-07-07 ENCOUNTER — Inpatient Hospital Stay (HOSPITAL_COMMUNITY): Payer: Medicare Other

## 2013-07-07 ENCOUNTER — Emergency Department (HOSPITAL_COMMUNITY): Payer: Medicare Other

## 2013-07-07 ENCOUNTER — Emergency Department (HOSPITAL_COMMUNITY): Payer: Medicare Other | Admitting: Anesthesiology

## 2013-07-07 ENCOUNTER — Encounter (HOSPITAL_COMMUNITY): Payer: Self-pay | Admitting: Anesthesiology

## 2013-07-07 ENCOUNTER — Encounter (HOSPITAL_COMMUNITY): Payer: Self-pay | Admitting: General Surgery

## 2013-07-07 ENCOUNTER — Encounter (HOSPITAL_COMMUNITY)
Admission: EM | Disposition: A | Discharge status: Home Health | Payer: Self-pay | Source: Home / Self Care | Attending: General Surgery

## 2013-07-07 ENCOUNTER — Inpatient Hospital Stay (HOSPITAL_COMMUNITY)
Admission: EM | Admit: 2013-07-07 | Discharge: 2013-07-15 | DRG: 326 | Disposition: A | Discharge status: Home Health | Payer: Medicare Other | Attending: General Surgery | Admitting: General Surgery

## 2013-07-07 ENCOUNTER — Other Ambulatory Visit: Payer: Self-pay

## 2013-07-07 DIAGNOSIS — K219 Gastro-esophageal reflux disease without esophagitis: Secondary | ICD-10-CM

## 2013-07-07 DIAGNOSIS — C169 Malignant neoplasm of stomach, unspecified: Secondary | ICD-10-CM

## 2013-07-07 DIAGNOSIS — I10 Essential (primary) hypertension: Secondary | ICD-10-CM

## 2013-07-07 DIAGNOSIS — R300 Dysuria: Secondary | ICD-10-CM

## 2013-07-07 DIAGNOSIS — E663 Overweight: Secondary | ICD-10-CM

## 2013-07-07 DIAGNOSIS — K255 Chronic or unspecified gastric ulcer with perforation: Secondary | ICD-10-CM

## 2013-07-07 DIAGNOSIS — D3501 Benign neoplasm of right adrenal gland: Secondary | ICD-10-CM

## 2013-07-07 HISTORY — PX: GASTRORRHAPHY: SHX6263

## 2013-07-07 HISTORY — PX: LAPAROTOMY: SHX154

## 2013-07-07 LAB — CBC WITH DIFFERENTIAL/PLATELET
Eosinophils Absolute: 0 10*3/uL (ref 0.0–0.7)
Eosinophils Relative: 0 % (ref 0–5)
HCT: 31.4 % — ABNORMAL LOW (ref 36.0–46.0)
Lymphocytes Relative: 5 % — ABNORMAL LOW (ref 12–46)
Lymphs Abs: 0.5 10*3/uL — ABNORMAL LOW (ref 0.7–4.0)
MCH: 24.9 pg — ABNORMAL LOW (ref 26.0–34.0)
MCV: 76.6 fL — ABNORMAL LOW (ref 78.0–100.0)
Monocytes Absolute: 0.7 10*3/uL (ref 0.1–1.0)
Neutrophils Relative %: 89 % — ABNORMAL HIGH (ref 43–77)
RBC: 4.1 MIL/uL (ref 3.87–5.11)
RDW: 15.6 % — ABNORMAL HIGH (ref 11.5–15.5)
WBC: 10.7 10*3/uL — ABNORMAL HIGH (ref 4.0–10.5)

## 2013-07-07 LAB — COMPREHENSIVE METABOLIC PANEL
ALT: 291 U/L — ABNORMAL HIGH (ref 0–35)
CO2: 31 mEq/L (ref 19–32)
Calcium: 9.9 mg/dL (ref 8.4–10.5)
Chloride: 96 mEq/L (ref 96–112)
Creatinine, Ser: 0.78 mg/dL (ref 0.50–1.10)
GFR calc Af Amer: >90 mL/min (ref >90)
GFR calc non Af Amer: 81 mL/min — ABNORMAL LOW (ref >90)
Glucose, Bld: 200 mg/dL — ABNORMAL HIGH (ref 70–99)
Total Bilirubin: 0.2 mg/dL — ABNORMAL LOW (ref 0.3–1.2)
Total Protein: 7.5 g/dL (ref 6.0–8.3)

## 2013-07-07 LAB — LIPASE, BLOOD: Lipase: 22 U/L (ref 11–59)

## 2013-07-07 LAB — OCCULT BLOOD, POC DEVICE: Fecal Occult Bld: POSITIVE — AB

## 2013-07-07 LAB — LACTIC ACID, PLASMA: Lactic Acid, Venous: 2.2 mmol/L (ref 0.5–2.2)

## 2013-07-07 SURGERY — LAPAROTOMY, EXPLORATORY
Anesthesia: General | Site: Abdomen | Wound class: Contaminated

## 2013-07-07 MED ORDER — SODIUM CHLORIDE 0.9 % IV BOLUS (SEPSIS)
1000.0000 mL | Freq: Once | INTRAVENOUS | Status: AC
  Administered 2013-07-07: 1000 mL via INTRAVENOUS

## 2013-07-07 MED ORDER — IOHEXOL 300 MG/ML  SOLN
50.0000 mL | Freq: Once | INTRAMUSCULAR | Status: AC | PRN
  Administered 2013-07-07: 50 mL via ORAL

## 2013-07-07 MED ORDER — FENTANYL CITRATE 0.05 MG/ML IJ SOLN
INTRAMUSCULAR | Status: AC
  Filled 2013-07-07: qty 5

## 2013-07-07 MED ORDER — ENOXAPARIN SODIUM 40 MG/0.4ML ~~LOC~~ SOLN
40.0000 mg | Freq: Once | SUBCUTANEOUS | Status: AC
  Administered 2013-07-07: 40 mg via SUBCUTANEOUS
  Filled 2013-07-07: qty 0.4

## 2013-07-07 MED ORDER — FENTANYL CITRATE 0.05 MG/ML IJ SOLN
INTRAMUSCULAR | Status: DC | PRN
  Administered 2013-07-07 (×4): 50 ug via INTRAVENOUS
  Administered 2013-07-07: 25 ug via INTRAVENOUS
  Administered 2013-07-07 (×3): 50 ug via INTRAVENOUS
  Administered 2013-07-07: 25 ug via INTRAVENOUS

## 2013-07-07 MED ORDER — PIPERACILLIN-TAZOBACTAM 3.375 G IVPB
3.3750 g | Freq: Three times a day (TID) | INTRAVENOUS | Status: DC
  Administered 2013-07-08 – 2013-07-15 (×22): 3.375 g via INTRAVENOUS
  Filled 2013-07-07 (×26): qty 50

## 2013-07-07 MED ORDER — SUCCINYLCHOLINE CHLORIDE 20 MG/ML IJ SOLN
INTRAMUSCULAR | Status: AC
  Filled 2013-07-07: qty 1

## 2013-07-07 MED ORDER — SODIUM CHLORIDE 0.9 % IR SOLN
Status: DC | PRN
  Administered 2013-07-07 (×2): 1000 mL

## 2013-07-07 MED ORDER — GLYCOPYRROLATE 0.2 MG/ML IJ SOLN
INTRAMUSCULAR | Status: AC
  Filled 2013-07-07: qty 3

## 2013-07-07 MED ORDER — ONDANSETRON HCL 4 MG/2ML IJ SOLN
INTRAMUSCULAR | Status: DC | PRN
  Administered 2013-07-07: 4 mg via INTRAVENOUS

## 2013-07-07 MED ORDER — ONDANSETRON HCL 4 MG PO TABS: 4.0000 mg | ORAL_TABLET | Freq: Four times a day (QID) | ORAL | Status: DC | PRN

## 2013-07-07 MED ORDER — IOHEXOL 300 MG/ML  SOLN
100.0000 mL | Freq: Once | INTRAMUSCULAR | Status: AC | PRN
  Administered 2013-07-07: 100 mL via INTRAVENOUS

## 2013-07-07 MED ORDER — ACETAMINOPHEN 325 MG RE SUPP: 325.0000 mg | RECTAL | Status: DC | PRN

## 2013-07-07 MED ORDER — ETOMIDATE 2 MG/ML IV SOLN
INTRAVENOUS | Status: DC | PRN
  Administered 2013-07-07: 12 mg via INTRAVENOUS

## 2013-07-07 MED ORDER — ROCURONIUM BROMIDE 50 MG/5ML IV SOLN
INTRAVENOUS | Status: AC
  Filled 2013-07-07: qty 1

## 2013-07-07 MED ORDER — MIDAZOLAM HCL 2 MG/2ML IJ SOLN
INTRAMUSCULAR | Status: AC
  Filled 2013-07-07: qty 2

## 2013-07-07 MED ORDER — SUCCINYLCHOLINE CHLORIDE 20 MG/ML IJ SOLN
INTRAMUSCULAR | Status: DC | PRN
  Administered 2013-07-07: 110 mg via INTRAVENOUS

## 2013-07-07 MED ORDER — LIDOCAINE HCL (CARDIAC) 20 MG/ML IV SOLN
INTRAVENOUS | Status: DC | PRN
  Administered 2013-07-07: 25 mg via INTRAVENOUS

## 2013-07-07 MED ORDER — PHENYLEPHRINE HCL 10 MG/ML IJ SOLN
INTRAMUSCULAR | Status: AC
  Filled 2013-07-07: qty 1

## 2013-07-07 MED ORDER — EPHEDRINE SULFATE 50 MG/ML IJ SOLN
INTRAMUSCULAR | Status: AC
  Filled 2013-07-07: qty 1

## 2013-07-07 MED ORDER — PIPERACILLIN-TAZOBACTAM 3.375 G IVPB
3.3750 g | Freq: Once | INTRAVENOUS | Status: AC
  Administered 2013-07-07: 3.375 g via INTRAVENOUS
  Filled 2013-07-07: qty 50

## 2013-07-07 MED ORDER — HYDROMORPHONE HCL PF 1 MG/ML IJ SOLN
1.0000 mg | INTRAMUSCULAR | Status: DC | PRN
  Administered 2013-07-07 – 2013-07-13 (×16): 1 mg via INTRAVENOUS
  Filled 2013-07-07 (×17): qty 1

## 2013-07-07 MED ORDER — MIDAZOLAM HCL 5 MG/5ML IJ SOLN
INTRAMUSCULAR | Status: DC | PRN
  Administered 2013-07-07 (×2): 1 mg via INTRAVENOUS

## 2013-07-07 MED ORDER — ROCURONIUM BROMIDE 100 MG/10ML IV SOLN
INTRAVENOUS | Status: DC | PRN
  Administered 2013-07-07 (×2): 10 mg via INTRAVENOUS
  Administered 2013-07-07: 5 mg via INTRAVENOUS
  Administered 2013-07-07 (×2): 10 mg via INTRAVENOUS
  Administered 2013-07-07: 25 mg via INTRAVENOUS

## 2013-07-07 MED ORDER — PIPERACILLIN-TAZOBACTAM 3.375 G IVPB
INTRAVENOUS | Status: AC
  Filled 2013-07-07: qty 50

## 2013-07-07 MED ORDER — ETOMIDATE 2 MG/ML IV SOLN
INTRAVENOUS | Status: AC
  Filled 2013-07-07: qty 10

## 2013-07-07 MED ORDER — LACTATED RINGERS IV SOLN
INTRAVENOUS | Status: DC | PRN
  Administered 2013-07-07 (×2): via INTRAVENOUS

## 2013-07-07 MED ORDER — LIDOCAINE HCL (PF) 1 % IJ SOLN
INTRAMUSCULAR | Status: AC
  Filled 2013-07-07: qty 5

## 2013-07-07 MED ORDER — PANTOPRAZOLE SODIUM 40 MG IV SOLR
40.0000 mg | Freq: Every day | INTRAVENOUS | Status: DC
  Administered 2013-07-07 – 2013-07-12 (×6): 40 mg via INTRAVENOUS
  Filled 2013-07-07 (×6): qty 40

## 2013-07-07 MED ORDER — GLYCOPYRROLATE 0.2 MG/ML IJ SOLN
INTRAMUSCULAR | Status: DC | PRN
  Administered 2013-07-07: .6 mg via INTRAVENOUS

## 2013-07-07 MED ORDER — ENOXAPARIN SODIUM 30 MG/0.3ML ~~LOC~~ SOLN
30.0000 mg | SUBCUTANEOUS | Status: DC
  Administered 2013-07-08 – 2013-07-09 (×2): 30 mg via SUBCUTANEOUS
  Filled 2013-07-07 (×2): qty 0.3

## 2013-07-07 MED ORDER — HYDROMORPHONE HCL PF 1 MG/ML IJ SOLN
1.0000 mg | Freq: Once | INTRAMUSCULAR | Status: AC
  Administered 2013-07-07: 1 mg via INTRAVENOUS
  Filled 2013-07-07: qty 1

## 2013-07-07 MED ORDER — BUPIVACAINE HCL (PF) 0.5 % IJ SOLN
INTRAMUSCULAR | Status: AC
  Filled 2013-07-07: qty 30

## 2013-07-07 MED ORDER — ONDANSETRON HCL 4 MG/2ML IJ SOLN
4.0000 mg | Freq: Once | INTRAMUSCULAR | Status: AC
  Administered 2013-07-07: 4 mg via INTRAVENOUS
  Filled 2013-07-07: qty 2

## 2013-07-07 MED ORDER — NEOSTIGMINE METHYLSULFATE 1 MG/ML IJ SOLN
INTRAMUSCULAR | Status: AC
  Filled 2013-07-07: qty 1

## 2013-07-07 MED ORDER — LACTATED RINGERS IV SOLN
INTRAVENOUS | Status: DC
  Administered 2013-07-08 – 2013-07-13 (×8): via INTRAVENOUS
  Administered 2013-07-14: 20 mL/h via INTRAVENOUS

## 2013-07-07 MED ORDER — POVIDONE-IODINE 10 % EX OINT
TOPICAL_OINTMENT | CUTANEOUS | Status: AC
  Filled 2013-07-07: qty 2

## 2013-07-07 MED ORDER — NEOSTIGMINE METHYLSULFATE 1 MG/ML IJ SOLN
INTRAMUSCULAR | Status: DC | PRN
  Administered 2013-07-07: 3 mg via INTRAVENOUS

## 2013-07-07 MED ORDER — KETOROLAC TROMETHAMINE 15 MG/ML IJ SOLN
15.0000 mg | Freq: Four times a day (QID) | INTRAMUSCULAR | Status: DC | PRN
  Filled 2013-07-07: qty 1

## 2013-07-07 MED ORDER — ALBUTEROL SULFATE (5 MG/ML) 0.5% IN NEBU: 2.5000 mg | INHALATION_SOLUTION | RESPIRATORY_TRACT | Status: DC | PRN

## 2013-07-07 MED ORDER — METOPROLOL TARTRATE 1 MG/ML IV SOLN
2.5000 mg | Freq: Four times a day (QID) | INTRAVENOUS | Status: DC
  Administered 2013-07-08 – 2013-07-13 (×22): 2.5 mg via INTRAVENOUS
  Filled 2013-07-07 (×23): qty 5

## 2013-07-07 MED ORDER — ONDANSETRON HCL 4 MG/2ML IJ SOLN
4.0000 mg | Freq: Four times a day (QID) | INTRAMUSCULAR | Status: DC | PRN
  Administered 2013-07-09: 4 mg via INTRAVENOUS
  Filled 2013-07-07: qty 2

## 2013-07-07 MED ORDER — CLINDAMYCIN PHOSPHATE 900 MG/50ML IV SOLN
900.0000 mg | Freq: Once | INTRAVENOUS | Status: AC
  Administered 2013-07-07: 900 mg via INTRAVENOUS
  Filled 2013-07-07: qty 50

## 2013-07-07 MED ORDER — LACTATED RINGERS IV BOLUS (SEPSIS)
1000.0000 mL | Freq: Once | INTRAVENOUS | Status: AC
  Administered 2013-07-07: 1000 mL via INTRAVENOUS

## 2013-07-07 MED ORDER — POVIDONE-IODINE 10 % OINT PACKET
TOPICAL_OINTMENT | CUTANEOUS | Status: DC | PRN
  Administered 2013-07-07: 2 via TOPICAL

## 2013-07-07 MED ORDER — PANTOPRAZOLE SODIUM 40 MG IV SOLR
40.0000 mg | Freq: Once | INTRAVENOUS | Status: AC
  Administered 2013-07-07: 40 mg via INTRAVENOUS
  Filled 2013-07-07: qty 40

## 2013-07-07 MED ORDER — KETOROLAC TROMETHAMINE 15 MG/ML IJ SOLN
15.0000 mg | Freq: Four times a day (QID) | INTRAMUSCULAR | Status: DC
  Filled 2013-07-07: qty 1

## 2013-07-07 SURGICAL SUPPLY — 66 items
APPLIER CLIP 11 MED OPEN (CLIP)
APPLIER CLIP 13 LRG OPEN (CLIP)
APR CLP MED 11 20 MLT OPN (CLIP)
BAG HAMPER (MISCELLANEOUS) ×2 IMPLANT
BARRIER SKIN 2 3/4 (OSTOMY) IMPLANT
BRR SKN FLT 2.75X2.25 2 PC (OSTOMY)
CELLS DAT CNTRL 66122 CELL SVR (MISCELLANEOUS) ×1 IMPLANT
CHLORAPREP W/TINT 26ML (MISCELLANEOUS) ×2 IMPLANT
CLAMP POUCH DRAINAGE QUIET (OSTOMY) IMPLANT
CLIP APPLIE 11 MED OPEN (CLIP) IMPLANT
CLIP APPLIE 13 LRG OPEN (CLIP) IMPLANT
CLOTH BEACON ORANGE TIMEOUT ST (SAFETY) ×2 IMPLANT
COVER LIGHT HANDLE STERIS (MISCELLANEOUS) ×4 IMPLANT
DRAPE WARM FLUID 44X44 (DRAPE) ×2 IMPLANT
DRSG OPSITE POSTOP 4X10 (GAUZE/BANDAGES/DRESSINGS) IMPLANT
DURAPREP 26ML APPLICATOR (WOUND CARE) IMPLANT
ELECT BLADE 6 FLAT ULTRCLN (ELECTRODE) IMPLANT
ELECT REM PT RETURN 9FT ADLT (ELECTROSURGICAL) ×2
ELECTRODE REM PT RTRN 9FT ADLT (ELECTROSURGICAL) ×1 IMPLANT
GLOVE BIO SURGEON STRL SZ7.5 (GLOVE) ×4 IMPLANT
GLOVE BIOGEL PI IND STRL 7.5 (GLOVE) ×1 IMPLANT
GLOVE BIOGEL PI INDICATOR 7.5 (GLOVE) ×1
GLOVE ECLIPSE 7.0 STRL STRAW (GLOVE) ×2 IMPLANT
GLOVE EXAM NITRILE MD LF STRL (GLOVE) ×2 IMPLANT
GOWN STRL REIN XL XLG (GOWN DISPOSABLE) ×6 IMPLANT
INST SET MAJOR GENERAL (KITS) ×2 IMPLANT
KIT BLADEGUARD II DBL (SET/KITS/TRAYS/PACK) ×2 IMPLANT
KIT REMOVER STAPLE SKIN (MISCELLANEOUS) IMPLANT
KIT ROOM TURNOVER APOR (KITS) ×2 IMPLANT
LIGASURE IMPACT 36 18CM CVD LR (INSTRUMENTS) IMPLANT
MANIFOLD NEPTUNE II (INSTRUMENTS) ×2 IMPLANT
NEEDLE HYPO 25X1 1.5 SAFETY (NEEDLE) ×2 IMPLANT
NS IRRIG 1000ML POUR BTL (IV SOLUTION) ×4 IMPLANT
PACK ABDOMINAL MAJOR (CUSTOM PROCEDURE TRAY) ×2 IMPLANT
PAD ARMBOARD 7.5X6 YLW CONV (MISCELLANEOUS) ×2 IMPLANT
POUCH OSTOMY 2 3/4  H 3804 (WOUND CARE)
POUCH OSTOMY 2 3/4 H 3804 (WOUND CARE)
POUCH OSTOMY 2 PC DRNBL 2.75 (WOUND CARE) IMPLANT
RELOAD LINEAR CUT PROX 55 BLUE (ENDOMECHANICALS) IMPLANT
RELOAD PROXIMATE 75MM BLUE (ENDOMECHANICALS) IMPLANT
RETRACTOR WND ALEXIS 25 LRG (MISCELLANEOUS) IMPLANT
RTRCTR WOUND ALEXIS 18CM MED (MISCELLANEOUS) ×2
RTRCTR WOUND ALEXIS 25CM LRG (MISCELLANEOUS)
SET BASIN LINEN APH (SET/KITS/TRAYS/PACK) ×2 IMPLANT
SPONGE DRAIN TRACH 4X4 STRL 2S (GAUZE/BANDAGES/DRESSINGS) ×2 IMPLANT
SPONGE GAUZE 4X4 12PLY (GAUZE/BANDAGES/DRESSINGS) ×2 IMPLANT
SPONGE LAP 18X18 X RAY DECT (DISPOSABLE) ×2 IMPLANT
STAPLER GUN LINEAR PROX 60 (STAPLE) IMPLANT
STAPLER PROXIMATE 55 BLUE (STAPLE) IMPLANT
STAPLER PROXIMATE 75MM BLUE (STAPLE) IMPLANT
STAPLER VISISTAT (STAPLE) ×2 IMPLANT
SUCTION POOLE TIP (SUCTIONS) ×2 IMPLANT
SUT CHROMIC 0 SH (SUTURE) IMPLANT
SUT CHROMIC 2 0 SH (SUTURE) IMPLANT
SUT CHROMIC 3 0 SH 27 (SUTURE) ×2 IMPLANT
SUT ETHILON 3 0 FSL (SUTURE) ×2 IMPLANT
SUT NOVA NAB GS-26 0 60 (SUTURE) IMPLANT
SUT PROLENE 2 0 SH 30 (SUTURE) ×2 IMPLANT
SUT SILK 2 0 (SUTURE)
SUT SILK 2 0 REEL (SUTURE) IMPLANT
SUT SILK 2 0 SH (SUTURE) ×4 IMPLANT
SUT SILK 2-0 18XBRD TIE 12 (SUTURE) IMPLANT
SUT SILK 3 0 SH CR/8 (SUTURE) ×2 IMPLANT
SYR CONTROL 10ML LL (SYRINGE) ×2 IMPLANT
TAPE CLOTH SURG 4X10 WHT LF (GAUZE/BANDAGES/DRESSINGS) ×2 IMPLANT
TRAY FOLEY CATH 16FR SILVER (SET/KITS/TRAYS/PACK) ×2 IMPLANT

## 2013-07-07 NOTE — H&P (Signed)
[name] is an 72 y.o. female.   Chief Complaint: abdominal pain HPI: patient is a 72 year old black female with a known history of gastric cancer who presented emergency room with worsening abdominal pain. CT scan of the abdomen revealed free air and fluid within the abdominal cavity, consistent with a perforation of the anterior wall of the stomach. She just had a Port-A-Cath placed yesterday in [place] and was to undergo neoadjuvant chemotherapy prior to possible gastric resection.  Past Medical History  Diagnosis Date  . Hypertension   . Paroxysmal SVT (supraventricular tachycardia)   . Hypercholesteremia   . GERD (gastroesophageal reflux disease)   . PUD (peptic ulcer disease)   . DVT (deep venous thrombosis) 2007  . Adrenal adenoma     left  . Thyroid nodule     left  . H. pylori infection   . Diverticulosis   . 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 Diarrhea     recurrent  . Arthritis   . Hiatal hernia   . Cancer     adenocarcinoma    Past Surgical History  Procedure Laterality Date  . Total knee arthroplasty  bilateral knee  . Hip  replacement  left  . Appendectomy    . Cholecystectomy    . Abdominal hysterectomy    . Breast lumpectomy      left  . Ablation      of SVT  . Cardiac catheterization  2009    normal coronary arteries  . Colonoscopy  09/13/2009    BJY:[id number]/[id number] internal hemorrhoids  . Esophagogastroduodenoscopy  09/13/2009    EXB:[id number]/  . Esophagogastroduodenoscopy N/A 06/11/2013    Procedure: ESOPHAGOGASTRODUODENOSCOPY (EGD);  Surgeon: West Bali, MD;  Location: [facility];  Service: Endoscopy;  Laterality: N/A;  2:15-moved to 1045 [name] notified pt  . Portacath placement Left 07/06/2013    Procedure: INSERTION PORT-A-CATH;  Surgeon: Almond Lint, MD;  Location: [facility];  Service: General;  Laterality: Left;    Family History  Problem Relation Age of Onset  . Colon cancer Neg Hx    . Cancer Mother     leukemia  . Cancer Sister     breast  . Heart disease Brother   . Heart disease Sister    Social History:  reports that she has been smoking.  She has never used smokeless tobacco. She reports that  drinks alcohol. She reports that she does not use illicit drugs.  Allergies: No Known Allergies   (Not in a hospital admission)  Results for orders placed during the hospital encounter of 07/07/13 (from the past 48 hour(s))  OCCULT BLOOD, POC DEVICE     Status: Abnormal   Collection Time    07/07/13  1:27 PM      Result Value Range   Fecal Occult Bld POSITIVE (*) NEGATIVE  CBC WITH DIFFERENTIAL     Status: Abnormal   Collection Time    07/07/13  1:54 PM      Result Value Range   WBC 10.7 (*) 4.0 - 10.5 K/uL   RBC 4.10  3.87 - 5.11 MIL/uL   Hemoglobin 10.2 (*) 12.0 - 15.0 g/dL   HCT 44.0 (*) 10.2 - 72.5 %   MCV 76.6 (*) 78.0 - 100.0 fL   MCH 24.9 (*) 26.0 - 34.0 pg   MCHC 32.5  30.0 - 36.0 g/dL   RDW 36.6 (*) 44.0 - 34.7 %   Platelets 606 (*)  150 - 400 K/uL   Neutrophils Relative % 89 (*) 43 - 77 %   Neutro Abs 9.5 (*) 1.7 - 7.7 K/uL   Lymphocytes Relative 5 (*) 12 - 46 %   Lymphs Abs 0.5 (*) 0.7 - 4.0 K/uL   Monocytes Relative 6  3 - 12 %   Monocytes Absolute 0.7  0.1 - 1.0 K/uL   Eosinophils Relative 0  0 - 5 %   Eosinophils Absolute 0.0  0.0 - 0.7 K/uL   Basophils Relative 0  0 - 1 %   Basophils Absolute 0.0  0.0 - 0.1 K/uL  COMPREHENSIVE METABOLIC PANEL     Status: Abnormal   Collection Time    07/07/13  1:54 PM      Result Value Range   Sodium 134 (*) 135 - 145 mEq/L   Potassium 4.5  3.5 - 5.1 mEq/L   Chloride 96  96 - 112 mEq/L   CO2 31  19 - 32 mEq/L   Glucose, Bld 200 (*) 70 - 99 mg/dL   BUN 17  6 - 23 mg/dL   Creatinine, Ser 1.61  0.50 - 1.10 mg/dL   Calcium 9.9  8.4 - 09.6 mg/dL   Total Protein 7.5  6.0 - 8.3 g/dL   Albumin 2.6 (*) 3.5 - 5.2 g/dL   AST 045 (*) 0 - 37 U/L   ALT 291 (*) 0 - 35 U/L   Alkaline Phosphatase 138 (*) 39 -  117 U/L   Total Bilirubin 0.2 (*) 0.3 - 1.2 mg/dL   GFR calc non Af Amer 81 (*) >90 mL/min   GFR calc Af Amer >90  >90 mL/min   Comment: (NOTE)     The eGFR has been calculated using the CKD EPI equation.     This calculation has not been validated in all clinical situations.     eGFR's persistently <90 mL/min signify possible Chronic Kidney     Disease.  LIPASE, BLOOD     Status: None   Collection Time    07/07/13  1:54 PM      Result Value Range   Lipase 22  11 - 59 U/L  LACTIC ACID, PLASMA     Status: None   Collection Time    07/07/13  1:54 PM      Result Value Range   Lactic Acid, Venous 2.2  0.5 - 2.2 mmol/L  TYPE AND SCREEN     Status: None   Collection Time    07/07/13  1:54 PM      Result Value Range   ABO/RH(D) O POS     Antibody Screen NEG     Sample Expiration 07/10/2013     Unit Number [id number]     Blood Component Type RED CELLS,LR     Unit division 00     Status of Unit ALLOCATED     Transfusion Status OK TO TRANSFUSE     Crossmatch Result Compatible     Unit Number [id number]     Blood Component Type RED CELLS,LR     Unit division 00     Status of Unit ALLOCATED     Transfusion Status OK TO TRANSFUSE     Crossmatch Result Compatible     Ct Abdomen Pelvis W Contrast  07/07/2013   CLINICAL DATA:  Abdominal pain and melena for 2 days, hematochezia, history ulcer disease, appendectomy, adrenal adenoma, diverticulosis, smoking, gastric cancer  EXAM: CT ABDOMEN AND PELVIS WITH CONTRAST  TECHNIQUE: Multidetector  CT imaging of the abdomen and pelvis was performed using the standard protocol following bolus administration of intravenous contrast. Sagittal and coronal MPR images reconstructed from axial data set.  CONTRAST:  50mL OMNIPAQUE IOHEXOL 300 MG/ML SOLN, OMNIPAQUE IOHEXOL 300 MG/ML SOLN  COMPARISON:  06/04/2013, PET-CT 06/24/2013  FINDINGS: Dependent bibasilar atelectasis.  Liver, spleen, pancreas, right kidney and right adrenal gland normal  appearance.  Small cyst left kidney 18 x 14 mm image 28.  Stable bilateral adrenal nodules, larger on left, with washout characteristics consistent with adrenal adenoma.  Free intraperitoneal air in fluid identified in the upper abdomen with free fluid in the pelvis consistent with perforated viscus.  Diffuse gastric wall thickening consistent with known gastric carcinoma.  Focal gas is identified at the anterior margin of the stomach adjacent to the left lobe of the liver, suspect anterior gastric perforation.  Segment of questionable wall thickening of the mid descending colon versus artifact.  Remaining bowel loops normal appearance.  Beam hardening artifacts from left hip prosthesis obscure portions of pelvis, with visualized bladder unremarkable.  No additional mass or adenopathy.  Scattered degenerative disc disease changes thoracolumbar spine greatest at L3-L4.  IMPRESSION: Free intraperitoneal air and fluid compatible with perforated viscus, suspect from anterior wall of the proximal to mid stomach.  Questionable segment of colonic wall thickening at the mid descending colon versus underdistention artifact, cannot exclude segment of colitis.  Stable adrenal nodules and left renal cyst.  Critical Value/emergent results were called by telephone at the time of interpretation on 07/07/2013 at 5:35 PM to Dr.JOHN [facility] , who verbally acknowledged these results.   Electronically Signed   By: Ulyses Southward M.D.   On: 07/07/2013 17:36   Dg Chest Port 1 View  07/06/2013   *RADIOLOGY REPORT*  Clinical Data: Evaluate port catheter placement  PORTABLE CHEST - 1 VIEW  Comparison: 06/16/2013; PET CT - 06/24/2013  Findings:  Grossly unchanged enlarged cardiac silhouette and mediastinal contours given persistent reduced lung volumes.  Interval placement of a left anterior chest wall subclavian vein approach port-a- catheter with tip projected over the superior SVC.  No pneumothorax.  There is persistent left basilar  heterogeneous opacities favored to represent atelectasis.  Trace left-sided effusion is not excluded.  IMPRESSION: 1.  Interval placement of left anterior chest wall port-a-catheter with tip projected over the superior SVC.  No pneumothorax. 2.  Persistently reduced lung volumes with left basilar opacities, favored to represent atelectasis.   Original Report Authenticated By: Tacey Ruiz, MD   Dg C-arm 1-60 Min-no Report  07/06/2013   CLINICAL DATA: port a cath   C-ARM 1-60 MINUTES  Fluoroscopy was utilized by the requesting physician.  No radiographic  interpretation.     Review of Systems  Constitutional: Positive for weight loss.  HENT: Negative.   Eyes: Negative.   Respiratory: Negative.   Cardiovascular: Negative.   Gastrointestinal: Positive for nausea and abdominal pain.  Genitourinary: Negative.   Musculoskeletal: Negative.   Skin: Negative.     Blood pressure 137/63, pulse 64, temperature 98.4 F (36.9 C), temperature source Oral, resp. rate 28, height 5\' 6"  (1.676 m), weight 87.998 kg (194 lb), SpO2 94.00%. Physical Exam  Constitutional: She is oriented to person, place, and time. She appears well-developed and well-nourished.  HENT:  Head: Normocephalic and atraumatic.  Neck: Normal range of motion. Neck supple.  Cardiovascular: Normal rate, regular rhythm and normal heart sounds.   Respiratory: Effort normal and breath sounds normal. No respiratory distress.  GI: She exhibits distension. There is tenderness. There is rebound.  Neurological: She is alert and oriented to person, place, and time.  Skin: Skin is warm and dry.     Assessment/Plan Impression: Perforated gastric cancer Plan: The patient will receive some fluid boluses prior to going to the operating room emergently for an exploratory laparotomy and repair of gastric perforation. She understands that I will not be removing any portion of her stomach. The risks and benefits of the procedure including bleeding,  infection, cardiopulmonary difficulties, and the possibility of a failed repair were fully explained to the patient, who gave informed consent.  Everlina Gotts A 07/07/2013, 6:55 PM

## 2013-07-07 NOTE — Anesthesia Postprocedure Evaluation (Addendum)
  Anesthesia Post-op Note  Patient: [name]  Procedure(s) Performed: Procedure(s): EXPLORATORY LAPAROTOMY (N/A) GASTRORRHAPHY (N/A)  Patient Location: PACU and ICU  Anesthesia Type:General  Level of Consciousness: awake and alert   Airway and Oxygen Therapy: Patient Spontanous Breathing and Patient connected to face mask oxygen  Post-op Pain: mild  Post-op Assessment: Post-op Vital signs reviewed, Patient's Cardiovascular Status Stable, Respiratory Function Stable, Patent Airway and No signs of Nausea or vomiting  Post-op Vital Signs: Reviewed and stable, 158/81, 89, 27, SpO2 100, 37  Complications: No apparent anesthesia complications 07/12/13  Patient seen post-op day one.  VSS, no anesthetic complications. Late entry, inadvertently failed to document visit.

## 2013-07-07 NOTE — ED Notes (Signed)
IV access lost. Pt fluids stopped.

## 2013-07-07 NOTE — Anesthesia Procedure Notes (Signed)
Procedure Name: Intubation Date/Time: 07/07/2013 9:09 PM Performed by: [name] E Pre-anesthesia Checklist: Patient identified, Patient being monitored, Timeout performed, Emergency Drugs available and Suction available Patient Re-evaluated:Patient Re-evaluated prior to inductionOxygen Delivery Method: Circle System Utilized Preoxygenation: Pre-oxygenation with 100% oxygen Intubation Type: IV induction, Rapid sequence and Cricoid Pressure applied Laryngoscope Size: Mac and 3 Grade View: Grade I Tube type: Oral Tube size: 7.0 mm Number of attempts: 1 Airway Equipment and Method: stylet Placement Confirmation: ETT inserted through vocal cords under direct vision,  positive ETCO2 and breath sounds checked- equal and bilateral Secured at: 21 cm Tube secured with: Tape Dental Injury: Teeth and Oropharynx as per pre-operative assessment

## 2013-07-07 NOTE — Addendum Note (Signed)
Addendum created 07/07/13 2252 by Moshe Salisbury, CRNA   Modules edited: Anesthesia Medication Administration

## 2013-07-07 NOTE — Transfer of Care (Addendum)
Immediate Anesthesia Transfer of Care Note  Patient: [name]  Procedure(s) Performed: Procedure(s): EXPLORATORY LAPAROTOMY (N/A) GASTRORRHAPHY (N/A)  Patient Location: PACU and ICU  Anesthesia Type:General  Level of Consciousness: awake, alert  and oriented  Airway & Oxygen Therapy: Patient Spontanous Breathing and Patient connected to face mask oxygen  Post-op Assessment: Report given to PACU RN  Post vital signs: Reviewed and stable  Complications: No apparent anesthesia complications

## 2013-07-07 NOTE — ED Provider Notes (Signed)
CSN: [id number]     Arrival date & time 07/07/13  1251 History  This chart was scribed for Sharon Churn, MD by Quintella Reichert, ED scribe.  This patient was seen in room APA18/APA18 and the patient's care was started at 1:17 PM.   Chief Complaint  Patient presents with  . Abdominal Pain  . Melena    Patient is a 72 y.o. female presenting with abdominal pain. The history is provided by the patient. No language interpreter was used.  Abdominal Pain Pain location:  Periumbilical Pain quality: sharp   Pain radiates to:  Does not radiate Pain severity:  Severe Duration:  2 days Timing:  Intermittent Progression:  Worsening Chronicity:  New Context comment:  Chemotherapy port placed yesterday Relieved by: oxycodone. Associated symptoms: hematochezia and melena   Risk factors: being elderly   Risk factors comment:  Adenocarcinoma, h/o bleeding peptic ulcers   HPI Comments: [name] is a 72 y.o. female with h/o adenocarcinoma and PUD who presents to the Emergency Department complaining of severe intermittent abdominal pain with associated melena and hematochezia.  Pt states that her symptoms began 2 days ago and have been progressively worsening.  She localizes pain to the periumbilical region.and describes it as sharp.  She has attempted to treat pain with oxycodone, with some relief.  Pt states that when she moves her bowels it resembles "black blood."  This morning she used the bathroom 4-5 times consecutively and saw the same each time.  She also complains of mild nausea but denies vomiting.  Pt states that she had a port placed yesterday in order to start chemotherapy treatment on 10/6.  She admits to a prior h/o bleeding ulcers but states that on those occasions she has much milder abdominal pain.  She also had much less blood in her stool on those occasions.   Past Medical History  Diagnosis Date  . Hypertension   . Paroxysmal SVT (supraventricular tachycardia)   .  Hypercholesteremia   . GERD (gastroesophageal reflux disease)   . PUD (peptic ulcer disease)   . DVT (deep venous thrombosis) 2007  . Adrenal adenoma     left  . Thyroid nodule     left  . H. pylori infection   . Diverticulosis   . 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 Diarrhea     recurrent  . Arthritis   . Hiatal hernia   . Cancer     adenocarcinoma    Past Surgical History  Procedure Laterality Date  . Total knee arthroplasty  bilateral knee  . Hip  replacement  left  . Appendectomy    . Cholecystectomy    . Abdominal hysterectomy    . Breast lumpectomy      left  . Ablation      of SVT  . Cardiac catheterization  2009    normal coronary arteries  . Colonoscopy  09/13/2009    MWN:[id number] internal hemorrhoids  . Esophagogastroduodenoscopy  09/13/2009    GLO:VFIEPP/  . Esophagogastroduodenoscopy N/A 06/11/2013    Procedure: ESOPHAGOGASTRODUODENOSCOPY (EGD);  Surgeon: West Bali, MD;  Location: [facility];  Service: Endoscopy;  Laterality: N/A;  2:15-moved to 1045 [name] notified pt  . Portacath placement Left 07/06/2013    Procedure: INSERTION PORT-A-CATH;  Surgeon: Almond Lint, MD;  Location: [facility];  Service: General;  Laterality: Left;    Family History  Problem Relation Age of Onset  . Colon cancer  Neg Hx   . Cancer Mother     leukemia  . Cancer Sister     breast  . Heart disease Brother   . Heart disease Sister     History  Substance Use Topics  . Smoking status: Current Every Day Smoker -- 0.25 packs/day for 15 years  . Smokeless tobacco: Never Used     Comment: Quit smoking x 3 weeks  . Alcohol Use: Yes     Comment: occasionally    OB History   Grav Para Term Preterm Abortions TAB SAB Ect Mult Living                  Review of Systems  Gastrointestinal: Positive for abdominal pain, blood in stool, melena and hematochezia.       Melena  All other systems reviewed and are  negative.     Allergies  Review of patient's allergies indicates no known allergies.  Home Medications   Current Outpatient Rx  Name  Route  Sig  Dispense  Refill  . ALPRAZolam (XANAX) 0.25 MG tablet   Oral   Take 0.25 mg by mouth at bedtime as needed for sleep.         Marland Kitchen dexamethasone (DECADRON) 4 MG tablet      Take 2 tablets by mouth once a day on the day after chemotherapy and then take 2 tablets two times a day for 2 days. Take with food.   30 tablet   1   . dicyclomine (BENTYL) 20 MG tablet   Oral   Take 20 mg by mouth 2 (two) times daily at 8 am and 10 pm.         . diltiazem (TIAZAC) 360 MG 24 hr capsule   Oral   Take 360 mg by mouth every morning.          [name] Calcium (STOOL SOFTENER PO)   Oral   Take 1 capsule by mouth 2 (two) times daily.         . fish oil-omega-3 fatty acids 1000 MG capsule   Oral   Take 1 g by mouth daily.         Marland Kitchen lidocaine-prilocaine (EMLA) cream      Apply a quarter size amount to port site 1 hour prior to chemo. Do not rub in. Cover with plastic wrap.   30 g   prn   . Linaclotide (LINZESS) 145 MCG CAPS capsule   Oral   Take 145 mcg by mouth daily.         Marland Kitchen lovastatin (MEVACOR) 40 MG tablet   Oral   Take 2 tablets (80 mg total) by mouth at bedtime.   60 tablet   11   . metoCLOPramide (REGLAN) 10 MG tablet   Oral   Take 1 tablet (10 mg total) by mouth 4 (four) times daily.   100 tablet   3   . metoprolol (TOPROL-XL) 50 MG 24 hr tablet   Oral   Take 50 mg by mouth every morning.          Marland Kitchen oxyCODONE (OXY IR/ROXICODONE) 5 MG immediate release tablet   Oral   Take 1-2 tablets (5-10 mg total) by mouth every 4 (four) hours as needed for pain.   60 tablet   0   . pantoprazole (PROTONIX) 40 MG tablet   Oral   Take 40 mg by mouth 2 (two) times daily.           Marland Kitchen  Probiotic Product (PROBIOTIC FORMULA PO)   Oral   Take 1 tablet by mouth every morning.         . prochlorperazine (COMPAZINE)  10 MG tablet   Oral   Take 1 tablet (10 mg total) by mouth every 6 (six) hours.   100 tablet   3   . sucralfate (CARAFATE) 1 G tablet   Oral   Take 1 tablet (1 g total) by mouth 4 (four) times daily -  with meals and at bedtime.   60 tablet   2    BP 117/71  Pulse 79  Resp 25  Ht 5\' 6"  (1.676 m)  Wt 194 lb (87.998 kg)  BMI 31.33 kg/m2  SpO2 97%  Physical Exam  Nursing note and vitals reviewed. Constitutional: She is oriented to person, place, and time. She appears well-developed and well-nourished. She appears distressed (appears uncomfortable).  HENT:  Head: Normocephalic and atraumatic.  Mouth/Throat: Oropharynx is clear and moist.  Eyes: Conjunctivae are normal. Pupils are equal, round, and reactive to light. No scleral icterus.  Neck: Neck supple.  Cardiovascular: Normal rate, regular rhythm, normal heart sounds and intact distal pulses.   No murmur heard. Pulmonary/Chest: Effort normal and breath sounds normal. No stridor. No respiratory distress. She has no rales.  Abdominal: Soft. Bowel sounds are normal. She exhibits no distension. There is tenderness (generalized, worse in LUQ).  Genitourinary:  Normal rectal exam.  Brown stool.  Hemoccult positive.  Musculoskeletal: Normal range of motion.  Neurological: She is alert and oriented to person, place, and time.  Skin: Skin is warm and dry. No rash noted.  Psychiatric: She has a normal mood and affect. Her behavior is normal.    ED Course  CRITICAL CARE Performed by: Blake Divine DAVID Authorized by: Blake Divine DAVID Total critical care time: 35 minutes Critical care time was exclusive of separately billable procedures and treating other patients. Critical care was necessary to treat or prevent imminent or life-threatening deterioration of the following conditions: sepsis. Critical care was time spent personally by me on the following activities: development of treatment plan with patient or surrogate,  discussions with consultants, evaluation of patient's response to treatment, examination of patient, obtaining history from patient or surrogate, ordering and performing treatments and interventions, ordering and review of laboratory studies, ordering and review of radiographic studies, pulse oximetry, re-evaluation of patient's condition and review of old charts.   (including critical care time)  DIAGNOSTIC STUDIES: Oxygen Saturation is 97% on room air, normal by my interpretation.    COORDINATION OF CARE: 1:25 PM-Discussed treatment plan which includes pain medication, anti-emetics, EKG, CT abdomen and labs with pt at bedside and pt agreed to plan.    Labs Review Labs Reviewed  CBC WITH DIFFERENTIAL - Abnormal; Notable for the following:    WBC 10.7 (*)    Hemoglobin 10.2 (*)    HCT 31.4 (*)    MCV 76.6 (*)    MCH 24.9 (*)    RDW 15.6 (*)    Platelets 606 (*)    Neutrophils Relative % 89 (*)    Neutro Abs 9.5 (*)    Lymphocytes Relative 5 (*)    Lymphs Abs 0.5 (*)    All other components within normal limits  COMPREHENSIVE METABOLIC PANEL - Abnormal; Notable for the following:    Sodium 134 (*)    Glucose, Bld 200 (*)    Albumin 2.6 (*)    AST 130 (*)    ALT 291 (*)  Alkaline Phosphatase 138 (*)    Total Bilirubin 0.2 (*)    GFR calc non Af Amer 81 (*)    All other components within normal limits  OCCULT BLOOD, POC DEVICE - Abnormal; Notable for the following:    Fecal Occult Bld POSITIVE (*)    All other components within normal limits  LIPASE, BLOOD  LACTIC ACID, PLASMA  TYPE AND SCREEN    Imaging Review Ct Abdomen Pelvis W Contrast  07/07/2013   CLINICAL DATA:  Abdominal pain and melena for 2 days, hematochezia, history ulcer disease, appendectomy, adrenal adenoma, diverticulosis, smoking, gastric cancer  EXAM: CT ABDOMEN AND PELVIS WITH CONTRAST  TECHNIQUE: Multidetector CT imaging of the abdomen and pelvis was performed using the standard protocol following  bolus administration of intravenous contrast. Sagittal and coronal MPR images reconstructed from axial data set.  CONTRAST:  50mL OMNIPAQUE IOHEXOL 300 MG/ML SOLN, OMNIPAQUE IOHEXOL 300 MG/ML SOLN  COMPARISON:  06/04/2013, PET-CT 06/24/2013  FINDINGS: Dependent bibasilar atelectasis.  Liver, spleen, pancreas, right kidney and right adrenal gland normal appearance.  Small cyst left kidney 18 x 14 mm image 28.  Stable bilateral adrenal nodules, larger on left, with washout characteristics consistent with adrenal adenoma.  Free intraperitoneal air in fluid identified in the upper abdomen with free fluid in the pelvis consistent with perforated viscus.  Diffuse gastric wall thickening consistent with known gastric carcinoma.  Focal gas is identified at the anterior margin of the stomach adjacent to the left lobe of the liver, suspect anterior gastric perforation.  Segment of questionable wall thickening of the mid descending colon versus artifact.  Remaining bowel loops normal appearance.  Beam hardening artifacts from left hip prosthesis obscure portions of pelvis, with visualized bladder unremarkable.  No additional mass or adenopathy.  Scattered degenerative disc disease changes thoracolumbar spine greatest at L3-L4.  IMPRESSION: Free intraperitoneal air and fluid compatible with perforated viscus, suspect from anterior wall of the proximal to mid stomach.  Questionable segment of colonic wall thickening at the mid descending colon versus underdistention artifact, cannot exclude segment of colitis.  Stable adrenal nodules and left renal cyst.  Critical Value/emergent results were called by telephone at the time of interpretation on 07/07/2013 at 5:35 PM to Dr.Nayef [facility] , who verbally acknowledged these results.   Electronically Signed   By: Ulyses Southward M.D.   On: 07/07/2013 17:36   Dg Chest Port 1 View  07/06/2013   *RADIOLOGY REPORT*  Clinical Data: Evaluate port catheter placement  PORTABLE CHEST - 1  VIEW  Comparison: 06/16/2013; PET CT - 06/24/2013  Findings:  Grossly unchanged enlarged cardiac silhouette and mediastinal contours given persistent reduced lung volumes.  Interval placement of a left anterior chest wall subclavian vein approach port-a- catheter with tip projected over the superior SVC.  No pneumothorax.  There is persistent left basilar heterogeneous opacities favored to represent atelectasis.  Trace left-sided effusion is not excluded.  IMPRESSION: 1.  Interval placement of left anterior chest wall port-a-catheter with tip projected over the superior SVC.  No pneumothorax. 2.  Persistently reduced lung volumes with left basilar opacities, favored to represent atelectasis.   Original Report Authenticated By: Tacey Ruiz, MD   Dg C-arm 1-60 Min-no Report  07/06/2013   CLINICAL DATA: port a cath   C-ARM 1-60 MINUTES  Fluoroscopy was utilized by the requesting physician.  No radiographic  interpretation.   Today's radiology studies independently viewed by me.      MDM   1. Gastric  perforation    72 year old female with a history of gastric cancer presenting with chief complaint of abdominal pain and melena. On exam appears uncomfortable, abdomen soft but tender in left upper quadrant, stool is brown, non-melanic, but Hemoccult-positive.  Given abdominal pain and history of cancer will obtain CT scan.  IV Dilaudid and Zofran for symptoms. Type and screen.  8:50 PM CT showed gastric perforation with free air and free fluid. During her ED course, she required became transiently hypotensive, requiring more aggressive fluid resuscitation. IV antibiotics given as well.  I discussed the case with Dr. Lovell Sheehan (surgery) and her surgeon ,Dr. Donell Beers.  She has gone to the OR with Dr. Lovell Sheehan.  I personally performed the services described in this documentation, which was scribed in my presence. The recorded information has been reviewed and is accurate.      Sharon Churn, MD 07/07/13  [phone]

## 2013-07-07 NOTE — ED Notes (Signed)
Pt reports abdominal pain and black stools since this am. Pt reports she has stomach cancer and had port placed yesterday. Pt reports port has not been accessed and is supposed to start chemo on Monday. Pt reports diarrhea but denies n/v.

## 2013-07-07 NOTE — Op Note (Signed)
Patient:  [name]  DOB:  June 02, 1941  MRN:  [id number]   Preop Diagnosis:  Gastric perforation, gastric carcinoma  Postop Diagnosis:  Same, possible peritoneal implants  Procedure:  Exploratory laparotomy, gastrorrhaphy  Surgeon:  Franky Macho, M.D.  Anes:  General endotracheal  Indications:  Patient is a 72 year old black female with a recent diagnosis of gastric carcinoma who presented to [facility] with worsening abdominal pain. She just had a Port-A-Cath placed yesterday in [place] and was to start chemotherapy next week. CT scan the abdomen revealed a probable anterior gastric wall perforation with free air and fluid. The patient's treating surgeon for her gastric cancer was notified of the CT findings, but deferred treatment to [facility]. The risks and benefits of the procedure including bleeding, infection, cardiopulmonary difficulties, and the possibility of failure of the gastrorrhaphy were fully explained to the patient, who gave informed consent.  I told patient and family that this was palliation just for the gastric perforation. She would not have a gastric resection of her cancer at this time. The patient and family understood and agreed to the treatment plan.  Procedure note:  The patient was placed in the supine position. After induction of general endotracheal anesthesia, the abdomen was prepped and draped using usual sterile technique with DuraPrep. Surgical site confirmation was performed.  An upper midline incision was made down to the fascia. The peritoneal cavity was then entered into without difficulty. Cloudy gastric-appearing fluid was found. This was evacuated without difficulty. Along the anterior, superior portion of the lesser curve of the stomach, a large greater than 3 cm in diameter perforation was noted. No active bleeding was noted. It appeared that the liver had somewhat adhesed to this region but had broken away. A nasogastric tube was  positioned appropriately in the stomach. The liver was inspected and noted within normal limits. There were some abnormal implants along the anterior peritoneal layer which were resected and sent to pathology further examination. This was concerning for metastatic carcinoma. We then proceeded with an omental patch of the gastric perforation. Using 2-0 and 3-0 silk sutures, surrounding omentum was secured to the wall of the perforation. The omental patch did sealed the hole completely. A #10 flat Jackson-Pratt drain was then placed into this region and brought out through a separate stab wound inferior to the incision line. It was secured at the skin level using 3-0 nylon interrupted suture. The abdominal cavity was then copiously irrigated with normal saline. The fascia was reapproximated using a looped 0 PDS running suture. Subcutaneous layer was irrigated with normal saline and skin was closed using staples. Betadine ointment and dry sterile dressings were applied.  All tape and needle counts were correct at the end of the procedure. The patient was extubated in the operating room and transferred to ICU in guarded but stable condition.  Complications:  None  EBL:  25 cc  Specimen:  Peritoneal implants  Drains: JP drain to omental patch repair

## 2013-07-07 NOTE — ED Notes (Signed)
Pt yelled out in pain. Pt HR dropped to 47, pt diaphoretic, bp dropped. EDP aware and at bedside.

## 2013-07-07 NOTE — ED Notes (Signed)
Confirmed lovenox injection with Dr Lovell Sheehan. Order to be given now.

## 2013-07-07 NOTE — ED Notes (Addendum)
Pt brought back from CT. Loss of IV access. XRAY attempted to re-start IV with no success. Pt had port placed yesterday. EDP called radiology and reported ED staff okay to access port. Charge RN aware. Cancer center called by Charge RN and given okay to access pt's port.

## 2013-07-07 NOTE — ED Notes (Signed)
SCD's at bedside.

## 2013-07-07 NOTE — ED Notes (Signed)
Operating Team at bedside.

## 2013-07-07 NOTE — Anesthesia Preprocedure Evaluation (Addendum)
Anesthesia Evaluation  Patient identified by MRN, date of birth, ID band Patient awake    Reviewed: Allergy & Precautions, H&P , NPO status , Patient's Chart, lab work & pertinent test results, reviewed documented beta blocker date and time   Airway Mallampati: II TM Distance: >3 FB Neck ROM: Full    Dental  (+) Teeth Intact   Pulmonary    + decreased breath sounds      Cardiovascular hypertension, Pt. on home beta blockers Rhythm:Regular Rate:Normal  2009 cardiac cath normal   Neuro/Psych    GI/Hepatic hiatal hernia, PUD, GERD-  Medicated and Controlled,  Endo/Other    Renal/GU      Musculoskeletal   Abdominal   Peds  Hematology   Anesthesia Other Findings   Reproductive/Obstetrics                        Anesthesia Physical Anesthesia Plan  ASA: III and emergent  Anesthesia Plan: General   Post-op Pain Management:    Induction: Intravenous, Rapid sequence and Cricoid pressure planned  Airway Management Planned: Oral ETT  Additional Equipment:   Intra-op Plan:   Post-operative Plan: Extubation in OR  Informed Consent:   Plan Discussed with:   Anesthesia Plan Comments:         Anesthesia Quick Evaluation

## 2013-07-07 NOTE — ED Notes (Signed)
Pt port accessed by Greg,RN of ED staff.

## 2013-07-07 NOTE — Telephone Encounter (Signed)
LMOM for pt to return my call.  I was calling to inform her of her PO appt with Dr. Donell Beers on 07/23/13 @ 12:00.

## 2013-07-08 ENCOUNTER — Inpatient Hospital Stay (HOSPITAL_COMMUNITY): Payer: Medicare Other

## 2013-07-08 ENCOUNTER — Encounter (HOSPITAL_COMMUNITY): Payer: Medicare Other

## 2013-07-08 ENCOUNTER — Encounter (HOSPITAL_COMMUNITY): Payer: Self-pay | Admitting: *Deleted

## 2013-07-08 LAB — CBC
HCT: 25.8 % — ABNORMAL LOW (ref 36.0–46.0)
Hemoglobin: 8.3 g/dL — ABNORMAL LOW (ref 12.0–15.0)
MCH: 24.7 pg — ABNORMAL LOW (ref 26.0–34.0)
MCHC: 32.2 g/dL (ref 30.0–36.0)
MCV: 76.8 fL — ABNORMAL LOW (ref 78.0–100.0)
RDW: 16 % — ABNORMAL HIGH (ref 11.5–15.5)

## 2013-07-08 LAB — GLUCOSE, CAPILLARY
Glucose-Capillary: 112 mg/dL — ABNORMAL HIGH (ref 70–99)
Glucose-Capillary: 118 mg/dL — ABNORMAL HIGH (ref 70–99)
Glucose-Capillary: 128 mg/dL — ABNORMAL HIGH (ref 70–99)

## 2013-07-08 LAB — BASIC METABOLIC PANEL
CO2: 28 mEq/L (ref 19–32)
Calcium: 8.4 mg/dL (ref 8.4–10.5)
Creatinine, Ser: 0.85 mg/dL (ref 0.50–1.10)
GFR calc non Af Amer: 66 mL/min — ABNORMAL LOW (ref >90)
Glucose, Bld: 162 mg/dL — ABNORMAL HIGH (ref 70–99)
Sodium: 134 mEq/L — ABNORMAL LOW (ref 135–145)

## 2013-07-08 LAB — MAGNESIUM: Magnesium: 2.4 mg/dL (ref 1.5–2.5)

## 2013-07-08 MED ORDER — KETOROLAC TROMETHAMINE 15 MG/ML IJ SOLN
30.0000 mg | Freq: Four times a day (QID) | INTRAMUSCULAR | Status: DC | PRN
  Filled 2013-07-08: qty 2

## 2013-07-08 MED ORDER — KETOROLAC TROMETHAMINE 30 MG/ML IJ SOLN: 15.0000 mg | Freq: Four times a day (QID) | INTRAMUSCULAR | Status: DC | PRN

## 2013-07-08 MED ORDER — INSULIN ASPART 100 UNIT/ML ~~LOC~~ SOLN
0.0000 [IU] | SUBCUTANEOUS | Status: DC
  Administered 2013-07-08 – 2013-07-11 (×3): 2 [IU] via SUBCUTANEOUS
  Administered 2013-07-12: 1 [IU] via SUBCUTANEOUS
  Administered 2013-07-12: 2 [IU] via SUBCUTANEOUS
  Administered 2013-07-12: 3 [IU] via SUBCUTANEOUS
  Administered 2013-07-12: 2 [IU] via SUBCUTANEOUS
  Administered 2013-07-13: 3 [IU] via SUBCUTANEOUS
  Administered 2013-07-13: 2 [IU] via SUBCUTANEOUS

## 2013-07-08 MED ORDER — KETOROLAC TROMETHAMINE 30 MG/ML IJ SOLN
15.0000 mg | Freq: Once | INTRAMUSCULAR | Status: AC
  Administered 2013-07-08: 15 mg via INTRAVENOUS
  Filled 2013-07-08: qty 1

## 2013-07-08 MED ORDER — KETOROLAC TROMETHAMINE 30 MG/ML IJ SOLN
15.0000 mg | Freq: Four times a day (QID) | INTRAMUSCULAR | Status: AC | PRN
  Administered 2013-07-08 – 2013-07-12 (×8): 15 mg via INTRAVENOUS
  Filled 2013-07-08 (×8): qty 1

## 2013-07-08 NOTE — Progress Notes (Signed)
INITIAL NUTRITION ASSESSMENT  DOCUMENTATION CODES Per approved criteria  -Morbid Obesity   INTERVENTION: TPN per pharmacy (to start after PICC line placement at IR at [facility])  NUTRITION DIAGNOSIS: Inadequate oral intake related to decreased appetite, altered GI function as evidenced by NPO.   Goal: Pt will meet >75% of estimated nutritional needs  Monitor:  TPN initiation/tolerance/advancement, transition to PO diet as medically indicated, labs, wt changes, skin integrity, changes in status  Reason for Assessment: MST=2  72 y.o. female  Admitting Dx: <principal problem not specified>  ASSESSMENT: Pt admitted for abdominal pain and melena. S/p exp and gastrorrhaphy due to gastric perforation. Noted pt has recently been diagnosed with gastric cancer and s/p portacath placement on 07/06/13. Pt was scheduled to start chemo on Monday. Plan is to undergo gastric secretion after completion of chemo.  Pt familiar to me due to outpatient referral to nutrition services; pt was scheduled for appointment on 07/06/13, but was a no-show due to portacath placement. Pt was referred due to poor nutritional status- hx of poor po intake due to nausea and decreased appetite.  Wt hx reveals fluctuations in wt. UBW: 210#. 11# (5.2%) wt gain x 1 year and 17# (8.4%) wt gain x 6 months are not clinically significant. 14# (6.8%) wt gain x 1 month and 24# (12.2%) wt gain x 1 week are clinically significant.  TPN will be started pending PICC line placement. PICC insertion was unsuccessful; pt scheduled to go to [facility] IR for PICC placement. Pharmacy note reveals plan to start Clinimix E 5/15 @ 30 ml/hr and titrate up to goal of 80 ml/hr. Pt will also receive MVI with minerals and lipids MWF. Initial TPN rate of 30 ml/hr will provide 511 total kcals (367 kcals from dextrose and 144 kcals from amino acids), which meet 25% of estimated pt needs. TPN regimen at goal rate (80 ml/hr) will provide 1363 total kcals (979 kcals from  dextrose and 384 kcals from amino acids), which will meet 66% of pt estimated needs. Unable to perform nutrition focused physical exam due to somnolence. Unable to diagnose malnutrition at this time, however, pt is at high risk for malnutrition given altered GI function, increased needs due to cancer, and hx of poor po intake.   Height: Ht Readings from Last 1 Encounters:  07/07/13 5\' 6"  (1.676 m)    Weight: Wt Readings from Last 1 Encounters:  07/08/13 220 lb 14.4 oz (100.2 kg)    Ideal Body Weight: 130#  % Ideal Body Weight: 169%  Wt Readings from Last 10 Encounters:  07/08/13 220 lb 14.4 oz (100.2 kg)  07/08/13 220 lb 14.4 oz (100.2 kg)  07/06/13 196 lb 4 oz (89.018 kg)  07/06/13 196 lb 4 oz (89.018 kg)  06/30/13 196 lb 3.2 oz (88.996 kg)  06/28/13 194 lb 9.6 oz (88.27 kg)  06/24/13 198 lb (89.812 kg)  06/16/13 204 lb 5.9 oz (92.7 kg)  06/11/13 206 lb (93.441 kg)  06/11/13 206 lb (93.441 kg)    Usual Body Weight: 210#  % Usual Body Weight: 105%  BMI:  Body mass index is 35.67 kg/(m^2). Meets criteria for obesity, class II.   Estimated Nutritional Needs: Kcal: 2079-2426 daily Protein: 100-125 grams daily Fluid: 2.1-2.4 L daily  Skin: abdominal and chest incision  Diet Order: NPO  EDUCATION NEEDS: -Education not appropriate at this time   Intake/Output Summary (Last 24 hours) at 07/08/13 1457 Last data filed at 07/08/13 1218  Gross per 24 hour  Intake  2300 ml  Output    720 ml  Net   1580 ml    Last BM: 07/07/13   Labs:   Recent Labs Lab 07/07/13 1354 07/08/13 0431  NA 134* 134*  K 4.5 5.0  CL 96 98  CO2 31 28  BUN 17 17  CREATININE 0.78 0.85  CALCIUM 9.9 8.4  MG  --  2.4  PHOS  --  4.0  GLUCOSE 200* 162*    CBG (last 3)  No results found for this basename: GLUCAP,  in the last 72 hours  Scheduled Meds: . enoxaparin (LOVENOX) injection  30 mg Subcutaneous Q24H  . insulin aspart  0-15 Units Subcutaneous Q4H  . metoprolol  2.5 mg  Intravenous Q6H  . pantoprazole (PROTONIX) IV  40 mg Intravenous QHS  . piperacillin-tazobactam (ZOSYN)  IV  3.375 g Intravenous Q8H    Continuous Infusions: . lactated ringers 150 mL/hr at 07/08/13 0505    Past Medical History  Diagnosis Date  . Hypertension   . Paroxysmal SVT (supraventricular tachycardia)   . Hypercholesteremia   . GERD (gastroesophageal reflux disease)   . PUD (peptic ulcer disease)   . DVT (deep venous thrombosis) 2007  . Adrenal adenoma     left  . Thyroid nodule     left  . H. pylori infection   . Diverticulosis   . 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 Diarrhea     recurrent  . Arthritis   . Hiatal hernia   . Cancer     adenocarcinoma    Past Surgical History  Procedure Laterality Date  . Total knee arthroplasty  bilateral knee  . Hip  replacement  left  . Appendectomy    . Cholecystectomy    . Abdominal hysterectomy    . Breast lumpectomy      left  . Ablation      of SVT  . Cardiac catheterization  2009    normal coronary arteries  . Colonoscopy  09/13/2009    ZOX:[id number]/XBJYNWGN internal hemorrhoids  . Esophagogastroduodenoscopy  09/13/2009    FAO:ZHYQMV/  . Esophagogastroduodenoscopy N/A 06/11/2013    Procedure: ESOPHAGOGASTRODUODENOSCOPY (EGD);  Surgeon: West Bali, MD;  Location: [facility] ENDO SUITE;  Service: Endoscopy;  Laterality: N/A;  2:15-moved to 1045 [name] notified pt  . Portacath placement Left 07/06/2013    Procedure: INSERTION PORT-A-CATH;  Surgeon: Almond Lint, MD;  Location: [facility] ORS;  Service: General;  Laterality: Left;    Deysi Soldo A. Mayford Knife, RD, LDN Pager: [phone]

## 2013-07-08 NOTE — Progress Notes (Addendum)
PARENTERAL NUTRITION CONSULT NOTE - INITIAL  Pharmacy Consult for TPN Indication: NPO s/p gastric surgery  No Known Allergies  Patient Measurements: Height: 5\' 6"  (167.6 cm) Weight: 220 lb 14.4 oz (100.2 kg) IBW/kg (Calculated) : 59.3 Adjusted Body Weight: 75 kg Usual Weight: ~ 100 kg  Vital Signs: Temp: 99.4 F (37.4 C) (10/02 0730) Temp src: Axillary (10/02 0730) BP: 125/110 mmHg (10/02 1200) Pulse Rate: 99 (10/02 1200) Intake/Output from previous day: 10/01 0701 - 10/02 0700 In: 2300 [I.V.:2300] Out: 680 [Urine:625; Drains:30; Blood:25] Intake/Output from this shift: Total I/O In: -  Out: 40 [Drains:40]  Labs:  Recent Labs  07/07/13 1354 07/08/13 0431  WBC 10.7* 10.6*  HGB 10.2* 8.3*  HCT 31.4* 25.8*  PLT 606* 475*     Recent Labs  07/07/13 1354 07/08/13 0431  NA 134* 134*  K 4.5 5.0  CL 96 98  CO2 31 28  GLUCOSE 200* 162*  BUN 17 17  CREATININE 0.78 0.85  CALCIUM 9.9 8.4  MG  --  2.4  PHOS  --  4.0  PROT 7.5  --   ALBUMIN 2.6*  --   AST 130*  --   ALT 291*  --   ALKPHOS 138*  --   BILITOT 0.2*  --    Estimated Creatinine Clearance: 70.4 ml/min (by C-G formula based on Cr of 0.85).   No results found for this basename: GLUCAP,  in the last 72 hours  Medical History: Past Medical History  Diagnosis Date  . Hypertension   . Paroxysmal SVT (supraventricular tachycardia)   . Hypercholesteremia   . GERD (gastroesophageal reflux disease)   . PUD (peptic ulcer disease)   . DVT (deep venous thrombosis) 2007  . Adrenal adenoma     left  . Thyroid nodule     left  . H. pylori infection   . Diverticulosis   . 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 Diarrhea     recurrent  . Arthritis   . Hiatal hernia   . Cancer     adenocarcinoma    Medications:  Scheduled:  . enoxaparin (LOVENOX) injection  30 mg Subcutaneous Q24H  . metoprolol  2.5 mg Intravenous Q6H  . pantoprazole (PROTONIX) IV  40 mg  Intravenous QHS  . piperacillin-tazobactam (ZOSYN)  IV  3.375 g Intravenous Q8H    Insulin Requirements in the past 24 hours:  none  Current Nutrition:  NPO  Assessment: 72 yo F with new dx gastric CA.  She was admitted with gastric carcinoma perforation s/p repair.  She remains NPO.  Her electrolytes are normal. Her renal function is good.  She has no history of diabetes, however blood glucose has been elevated this admission.  These are currently not been treated.   They were unsuccessful at placing bedside PICC line so this will need to be done by IR later today or tomorrow.   Nutritional Goals:  1500 kCal/day, 150 grams of protein per day  Plan:  1) Start TPN once PICC line placed.  Will initiate Clinimix E 5/15 at 73ml/hr & titrate as tolerated to goal rate of 3ml/hr.  Will add MVI/minerals to TPN daily. 2) Intralipids on MWF 3) Initiate CBG monitoring & sliding scale insulin as needed.  Will initiate this now since baseline glucose has been elevated. 4) Monitor CBC, CBGs, electrolytes, & TGs per protocol  [name], [facility] 07/08/2013,12:41 PM  Spoke with Shanda Bumps, RN.  Patient will have  PICC placed by IR at [facility] tomorrow (10/3).  Will start TPN tomorrow afternoon.  Junita Push, PharmD, BCPS

## 2013-07-08 NOTE — Progress Notes (Signed)
UR chart review completed.  

## 2013-07-08 NOTE — Progress Notes (Signed)
picc line removed due to malposition. Patient exteremly difficult picc placement. Patient to be sent to IR in [place].

## 2013-07-08 NOTE — Progress Notes (Signed)
1 Day Post-Op  Subjective: Alert, comfortable. Minimal incisional pain.  Objective: Vital signs in last 24 hours: Temp:  [98.4 F (36.9 C)-98.6 F (37 C)] 98.6 F (37 C) (10/02 0400) Pulse Rate:  [56-88] 73 (10/02 0800) Resp:  [15-29] 18 (10/02 0800) BP: (88-158)/(48-81) 106/56 mmHg (10/02 0800) SpO2:  [94 %-98 %] 96 % (10/02 0800) Weight:  [87.998 kg (194 lb)-100.2 kg (220 lb 14.4 oz)] 100.2 kg (220 lb 14.4 oz) (10/02 0500) Last BM Date: 07/07/13  Intake/Output from previous day: 10/01 0701 - 10/02 0700 In: 2300 [I.V.:2300] Out: 680 [Urine:625; Drains:30; Blood:25] Intake/Output this shift:    General appearance: alert, cooperative and no distress Resp: clear to auscultation bilaterally Cardio: regular rate and rhythm, S1, S2 normal, no murmur, click, rub or gallop GI: Soft. Dressing dry and intact. JP drainage serosanguineous in nature.  Lab Results:   Recent Labs  07/07/13 1354 07/08/13 0431  WBC 10.7* 10.6*  HGB 10.2* 8.3*  HCT 31.4* 25.8*  PLT 606* 475*   BMET  Recent Labs  07/07/13 1354 07/08/13 0431  NA 134* 134*  K 4.5 5.0  CL 96 98  CO2 31 28  GLUCOSE 200* 162*  BUN 17 17  CREATININE 0.78 0.85  CALCIUM 9.9 8.4   PT/INR No results found for this basename: LABPROT, INR,  in the last 72 hours  Studies/Results: Ct Abdomen Pelvis W Contrast  07/07/2013   CLINICAL DATA:  Abdominal pain and melena for 2 days, hematochezia, history ulcer disease, appendectomy, adrenal adenoma, diverticulosis, smoking, gastric cancer  EXAM: CT ABDOMEN AND PELVIS WITH CONTRAST  TECHNIQUE: Multidetector CT imaging of the abdomen and pelvis was performed using the standard protocol following bolus administration of intravenous contrast. Sagittal and coronal MPR images reconstructed from axial data set.  CONTRAST:  50mL OMNIPAQUE IOHEXOL 300 MG/ML SOLN, OMNIPAQUE IOHEXOL 300 MG/ML SOLN  COMPARISON:  06/04/2013, PET-CT 06/24/2013  FINDINGS: Dependent bibasilar atelectasis.   Liver, spleen, pancreas, right kidney and right adrenal gland normal appearance.  Small cyst left kidney 18 x 14 mm image 28.  Stable bilateral adrenal nodules, larger on left, with washout characteristics consistent with adrenal adenoma.  Free intraperitoneal air in fluid identified in the upper abdomen with free fluid in the pelvis consistent with perforated viscus.  Diffuse gastric wall thickening consistent with known gastric carcinoma.  Focal gas is identified at the anterior margin of the stomach adjacent to the left lobe of the liver, suspect anterior gastric perforation.  Segment of questionable wall thickening of the mid descending colon versus artifact.  Remaining bowel loops normal appearance.  Beam hardening artifacts from left hip prosthesis obscure portions of pelvis, with visualized bladder unremarkable.  No additional mass or adenopathy.  Scattered degenerative disc disease changes thoracolumbar spine greatest at L3-L4.  IMPRESSION: Free intraperitoneal air and fluid compatible with perforated viscus, suspect from anterior wall of the proximal to mid stomach.  Questionable segment of colonic wall thickening at the mid descending colon versus underdistention artifact, cannot exclude segment of colitis.  Stable adrenal nodules and left renal cyst.  Critical Value/emergent results were called by telephone at the time of interpretation on 07/07/2013 at 5:35 PM to Dr.[facility] , who verbally acknowledged these results.   Electronically Signed   By: Ulyses Southward M.D.   On: 07/07/2013 17:36   Dg Chest Port 1 View  07/06/2013   *RADIOLOGY REPORT*  Clinical Data: Evaluate port catheter placement  PORTABLE CHEST - 1 VIEW  Comparison: 06/16/2013; PET CT -  06/24/2013  Findings:  Grossly unchanged enlarged cardiac silhouette and mediastinal contours given persistent reduced lung volumes.  Interval placement of a left anterior chest wall subclavian vein approach port-a- catheter with tip projected over the  superior SVC.  No pneumothorax.  There is persistent left basilar heterogeneous opacities favored to represent atelectasis.  Trace left-sided effusion is not excluded.  IMPRESSION: 1.  Interval placement of left anterior chest wall port-a-catheter with tip projected over the superior SVC.  No pneumothorax. 2.  Persistently reduced lung volumes with left basilar opacities, favored to represent atelectasis.   Original Report Authenticated By: Tacey Ruiz, MD    Anti-infectives: Anti-infectives   Start     Dose/Rate Route Frequency Ordered Stop   07/08/13 0200  piperacillin-tazobactam (ZOSYN) IVPB 3.375 g     3.375 g 12.5 mL/hr over 240 Minutes Intravenous Every 8 hours 07/07/13 2327     07/07/13 1930  clindamycin (CLEOCIN) IVPB 900 mg     900 mg 100 mL/hr over 30 Minutes Intravenous  Once 07/07/13 1923 07/07/13 2018   07/07/13 1830  piperacillin-tazobactam (ZOSYN) IVPB 3.375 g     3.375 g 12.5 mL/hr over 240 Minutes Intravenous  Once 07/07/13 1818 07/07/13 1949      Assessment/Plan: s/p Procedure(s): EXPLORATORY LAPAROTOMY GASTRORRHAPHY Impression: Stable, status post gastrorrhaphy for gastric carcinoma perforation. Patient does have anemia but this is secondary to chronic disease. PICC line is being placed. We'll start TPN. Will continue to monitor in ICU. Patient is doing remarkably well.  LOS: 1 day    [name] A 07/08/2013

## 2013-07-08 NOTE — Progress Notes (Signed)
This encounter was created in error - please disregard.  This encounter was created in error - please disregard.

## 2013-07-08 NOTE — Progress Notes (Signed)
PICC line was attempted, but not successful after attempts by both M. Jahari Billy, RN & Larrie Kass, RN. Could not get blood return when needle was inserted in rt arm. Left arm attempted, but resistance was felt with insertion of PICC. CXR confirmed that PICC was coiled in left subclavian vein so it will be removed. Pt's nurse, Kathyrn Sheriff, RN, was notified that pt will need IR to place PICC preferably on the right side considering the difficulty we had on the left side probably due to the pt's port a cath.

## 2013-07-09 ENCOUNTER — Encounter (HOSPITAL_COMMUNITY): Payer: Self-pay | Admitting: General Surgery

## 2013-07-09 ENCOUNTER — Ambulatory Visit (HOSPITAL_COMMUNITY)
Admit: 2013-07-09 | Discharge: 2013-07-09 | Disposition: A | Discharge status: Home / Self Care | Payer: Medicare Other | Attending: General Surgery | Admitting: General Surgery

## 2013-07-09 DIAGNOSIS — C169 Malignant neoplasm of stomach, unspecified: Secondary | ICD-10-CM

## 2013-07-09 DIAGNOSIS — K255 Chronic or unspecified gastric ulcer with perforation: Principal | ICD-10-CM

## 2013-07-09 LAB — CBC
HCT: 23.9 % — ABNORMAL LOW (ref 36.0–46.0)
MCH: 24.8 pg — ABNORMAL LOW (ref 26.0–34.0)
MCV: 75.9 fL — ABNORMAL LOW (ref 78.0–100.0)
Platelets: 452 10*3/uL — ABNORMAL HIGH (ref 150–400)
RBC: 3.15 MIL/uL — ABNORMAL LOW (ref 3.87–5.11)
RDW: 16.1 % — ABNORMAL HIGH (ref 11.5–15.5)
WBC: 15 10*3/uL — ABNORMAL HIGH (ref 4.0–10.5)

## 2013-07-09 LAB — GLUCOSE, CAPILLARY
Glucose-Capillary: 108 mg/dL — ABNORMAL HIGH (ref 70–99)
Glucose-Capillary: 119 mg/dL — ABNORMAL HIGH (ref 70–99)
Glucose-Capillary: 91 mg/dL (ref 70–99)
Glucose-Capillary: 117 mg/dL — ABNORMAL HIGH (ref 70–99)

## 2013-07-09 LAB — COMPREHENSIVE METABOLIC PANEL
AST: 31 U/L (ref 0–37)
Albumin: 1.7 g/dL — ABNORMAL LOW (ref 3.5–5.2)
CO2: 28 mEq/L (ref 19–32)
Calcium: 9 mg/dL (ref 8.4–10.5)
Chloride: 98 mEq/L (ref 96–112)
Creatinine, Ser: 0.83 mg/dL (ref 0.50–1.10)
Potassium: 4.4 mEq/L (ref 3.5–5.1)
Sodium: 135 mEq/L (ref 135–145)
Total Bilirubin: 0.5 mg/dL (ref 0.3–1.2)

## 2013-07-09 LAB — DIC (DISSEMINATED INTRAVASCULAR COAGULATION)PANEL
INR: 1.34 (ref 0.00–1.49)
Platelets: 404 10*3/uL — ABNORMAL HIGH (ref 150–400)
Smear Review: NONE SEEN

## 2013-07-09 LAB — HEMOGLOBIN AND HEMATOCRIT, BLOOD
HCT: 23.1 % — ABNORMAL LOW (ref 36.0–46.0)
Hemoglobin: 7.6 g/dL — ABNORMAL LOW (ref 12.0–15.0)

## 2013-07-09 LAB — DIFFERENTIAL
Basophils Relative: 0 % (ref 0–1)
Eosinophils Absolute: 0 10*3/uL (ref 0.0–0.7)
Eosinophils Relative: 0 % (ref 0–5)
Lymphs Abs: 0.6 10*3/uL — ABNORMAL LOW (ref 0.7–4.0)
Neutro Abs: 13.6 10*3/uL — ABNORMAL HIGH (ref 1.7–7.7)

## 2013-07-09 LAB — TRIGLYCERIDES: Triglycerides: 91 mg/dL (ref <150)

## 2013-07-09 MED ORDER — PHENOL 1.4 % MT LIQD
1.0000 | OROMUCOSAL | Status: DC | PRN
  Filled 2013-07-09 (×2): qty 177

## 2013-07-09 MED ORDER — TRACE MINERALS CR-CU-F-FE-I-MN-MO-SE-ZN IV SOLN
INTRAVENOUS | Status: AC
  Administered 2013-07-09: 18:00:00 via INTRAVENOUS
  Filled 2013-07-09: qty 2000

## 2013-07-09 MED ORDER — FAT EMULSION 20 % IV EMUL
250.0000 mL | INTRAVENOUS | Status: AC
  Administered 2013-07-09: 250 mL via INTRAVENOUS
  Filled 2013-07-09: qty 250

## 2013-07-09 MED ORDER — "THROMBI-PAD 3""X3"" EX PADS"
1.0000 | MEDICATED_PAD | Freq: Once | CUTANEOUS | Status: AC
  Administered 2013-07-10: 1 via TOPICAL
  Filled 2013-07-09: qty 1

## 2013-07-09 NOTE — Progress Notes (Signed)
Dr Lovell Sheehan paged back and made aware that PICC continues to ooze blood despite tightly wrapped with ACE bandage and sand bag. PICC dressing redressed and wrapped with ACE wrap. Sand bag replaced and arm elevated up. Will continue to monitor.

## 2013-07-09 NOTE — Progress Notes (Signed)
NUTRITION FOLLOW UP  Intervention:   TPN per pharmacy  Nutrition Dx:   Inadequate oral intake related to decreased appetite, altered GI function as evidenced by NPO; ongoing  Goal:   Pt will meet >75% of estimated nutritional needs  Monitor:   TPN tolerance/advancement, transition to PO diet as medically indicated, labs, wt changes, skin integrity, changes in status  Assessment:   Pt s/p PICC line placement at [facility] IR.  Noted 7# (3.2%) wt loss x 1 day, likely due to fluid changes. TPN planned to start today at 1800.  Clinimix E 5/15 @ 30 ml/hr and titrate up to goal of 80 ml/hr. Pt will also receive MVI with minerals and lipids MWF (480 kcals from lipids).  Initial TPN rate of 30 ml/hr will provide 511 total kcals (367 kcals from dextrose and 144 kcals from amino acids), which meet 27% of estimated pt needs. TPN regimen at goal rate (80 ml/hr) will provide 1363 total kcals (979 kcals from dextrose and 384 kcals from amino acids), which will meet 72% of pt estimated needs.  Height: Ht Readings from Last 1 Encounters:  07/07/13 5\' 6"  (1.676 m)    Weight Status:   Wt Readings from Last 1 Encounters:  07/09/13 213 lb 4.8 oz (96.752 kg)    Re-estimated needs:  Kcal: 1900-2100 kcals daily Protein: 97-121 grams daily  Fluid: 1.9-2.1 L daily  Skin: abdominal and chest incision   Diet Order: NPO   Intake/Output Summary (Last 24 hours) at 07/09/13 1529 Last data filed at 07/09/13 1300  Gross per 24 hour  Intake   2300 ml  Output    900 ml  Net   1400 ml    Last BM: 07/09/13   Labs:   Recent Labs Lab 07/07/13 1354 07/08/13 0431 07/09/13 0421  NA 134* 134* 135  K 4.5 5.0 4.4  CL 96 98 98  CO2 31 28 28   BUN 17 17 14   CREATININE 0.78 0.85 0.83  CALCIUM 9.9 8.4 9.0  MG  --  2.4 2.7*  PHOS  --  4.0 3.9  GLUCOSE 200* 162* 112*    CBG (last 3)   Recent Labs  07/09/13 0416 07/09/13 0743 07/09/13 1133  GLUCAP 108* 119* 118*    Scheduled Meds: . enoxaparin  (LOVENOX) injection  30 mg Subcutaneous Q24H  . insulin aspart  0-15 Units Subcutaneous Q4H  . metoprolol  2.5 mg Intravenous Q6H  . pantoprazole (PROTONIX) IV  40 mg Intravenous QHS  . piperacillin-tazobactam (ZOSYN)  IV  3.375 g Intravenous Q8H    Continuous Infusions: . Marland KitchenTPN (CLINIMIX-E) Adult     And  . fat emulsion    . lactated ringers 75 mL/hr at 07/09/13 1500    Ahlam Piscitelli A. Mayford Knife, RD, LDN Pager: [phone]

## 2013-07-09 NOTE — Consult Note (Signed)
[facility] Consultation Oncology  Name: [name]      MRN: [id number]    Location: IC04/IC04-01  Date: 07/09/2013 Time:6:15 PM   REFERRING PHYSICIAN:  Franky Macho, MD  REASON FOR CONSULT:  Coordination of care in light gastric perforation   DIAGNOSIS:  Gastric Carcinoma  HISTORY OF PRESENT ILLNESS:   This is a 72 yo African American woman who is well-known to the [facility] where we were preparing to begin ECF neoadjuvant chemotherapy on Monday 10/6 who presented to the ED with abdominal pan.  Imaging studies reveal gastric perforation which upstages her cancer to a T4 tumor.  With this information, Gen Surg was consulted and Dr. Lovell Sheehan discussed the case with Dr. Donell Beers (Onc Surg).  The decision was made for the patient to undergo surgical repair of perforation with a patch by Dr. Lovell Sheehan.  This surgery took place on 07/07/2013.  During surgery, Dr. Lovell Sheehan noted possible peritoneal implants which were biopsied and pathology is pending.   She is seen in ICU with a nasogastric tube in place for gastric rest and decompression.  She recently had a PICC placed by interventional radiology on 07/09/2013 for parental nutrition.    Case discussed with Dr. Lovell Sheehan and he plans on performing a dye study on Monday 10/6 to evaluate stability of surgical correction of perforation.  We will cancel Monday 10/6 chemotherapy appointment and reschedule for a later date once healed and free for chemotherapeutic intervention from a surgical standpoint.   She is comfortable in bed and denies any complaints other than dry mouth and desire to eat.  We provided the patient education regarding gastric rest.  She also notes a mild sore throat which is secondary to nasogastric tube/insertion.    All questions were answered.   PAST MEDICAL HISTORY:   Past Medical History  Diagnosis Date  . Hypertension   . Paroxysmal SVT (supraventricular tachycardia)   . Hypercholesteremia   . GERD  (gastroesophageal reflux disease)   . PUD (peptic ulcer disease)   . DVT (deep venous thrombosis) 2007  . Adrenal adenoma     left  . Thyroid nodule     left  . H. pylori infection   . Diverticulosis   . 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 Diarrhea     recurrent  . Arthritis   . Hiatal hernia   . Cancer     adenocarcinoma    ALLERGIES: No Known Allergies    MEDICATIONS: I have reviewed the patient's current medications.     PAST SURGICAL HISTORY Past Surgical History  Procedure Laterality Date  . Total knee arthroplasty  bilateral knee  . Hip  replacement  left  . Appendectomy    . Cholecystectomy    . Abdominal hysterectomy    . Breast lumpectomy      left  . Ablation      of SVT  . Cardiac catheterization  2009    normal coronary arteries  . Colonoscopy  09/13/2009    OZH:[id number]/[id number] internal hemorrhoids  . Esophagogastroduodenoscopy  09/13/2009    GMW:NUUVOZ/  . Esophagogastroduodenoscopy N/A 06/11/2013    Procedure: ESOPHAGOGASTRODUODENOSCOPY (EGD);  Surgeon: West Bali, MD;  Location: [facility] ENDO SUITE;  Service: Endoscopy;  Laterality: N/A;  2:15-moved to 1045 [name] notified pt  . Portacath placement Left 07/06/2013    Procedure: INSERTION PORT-A-CATH;  Surgeon: Almond Lint, MD;  Location: [facility] ORS;  Service: General;  Laterality:  Left;  . Laparotomy N/A 07/07/2013    Procedure: EXPLORATORY LAPAROTOMY;  Surgeon: Dalia Heading, MD;  Location: [facility] ORS;  Service: General;  Laterality: N/A;  . Gastrorrhaphy N/A 07/07/2013    Procedure: Ferrel Logan;  Surgeon: Dalia Heading, MD;  Location: [facility] ORS;  Service: General;  Laterality: N/A;    FAMILY HISTORY: Family History  Problem Relation Age of Onset  . Colon cancer Neg Hx   . Cancer Mother     leukemia  . Cancer Sister     breast  . Heart disease Brother   . Heart disease Sister     SOCIAL HISTORY:  reports that she has been smoking.  She has never used  smokeless tobacco. She reports that  drinks alcohol. She reports that she does not use illicit drugs.  PERFORMANCE STATUS: The patient's performance status is 2 - Symptomatic, <50% confined to bed  PHYSICAL EXAM: Most Recent Vital Signs: Blood pressure 132/68, pulse 92, temperature 98.6 F (37 C), temperature source Oral, resp. rate 24, height 5\' 6"  (1.676 m), weight 213 lb 4.8 oz (96.752 kg), SpO2 96.00%. General appearance: alert, cooperative, appears stated age and no distress Head: Normocephalic, without obvious abnormality, nasogastric tube in place Eyes: negative findings: lids and lashes normal, conjunctivae and sclerae normal and corneas clear Nose: nasogastric tube in place and suctioning Neck: supple, symmetrical, trachea midline Extremities: extremities normal, atraumatic, no cyanosis or edema and right UE PICC placed Skin: Skin color, texture, turgor normal. No rashes or lesions Neurologic: Grossly normal  LABORATORY DATA:  Results for orders placed during the hospital encounter of 07/07/13 (from the past 48 hour(s))  MRSA PCR SCREENING     Status: None   Collection Time    07/08/13 12:20 AM      Result Value Range   MRSA by PCR NEGATIVE  NEGATIVE   Comment:            The GeneXpert MRSA Assay (FDA     approved for NASAL specimens     only), is one component of a     comprehensive MRSA colonization     surveillance program. It is not     intended to diagnose MRSA     infection nor to guide or     monitor treatment for     MRSA infections.  BASIC METABOLIC PANEL     Status: Abnormal   Collection Time    07/08/13  4:31 AM      Result Value Range   Sodium 134 (*) 135 - 145 mEq/L   Potassium 5.0  3.5 - 5.1 mEq/L   Chloride 98  96 - 112 mEq/L   CO2 28  19 - 32 mEq/L   Glucose, Bld 162 (*) 70 - 99 mg/dL   BUN 17  6 - 23 mg/dL   Creatinine, Ser 4.54  0.50 - 1.10 mg/dL   Calcium 8.4  8.4 - 09.8 mg/dL   GFR calc non Af Amer 66 (*) >90 mL/min   GFR calc Af Amer 77 (*)  >90 mL/min   Comment: (NOTE)     The eGFR has been calculated using the CKD EPI equation.     This calculation has not been validated in all clinical situations.     eGFR's persistently <90 mL/min signify possible Chronic Kidney     Disease.  MAGNESIUM     Status: None   Collection Time    07/08/13  4:31 AM      Result Value  Range   Magnesium 2.4  1.5 - 2.5 mg/dL  PHOSPHORUS     Status: None   Collection Time    07/08/13  4:31 AM      Result Value Range   Phosphorus 4.0  2.3 - 4.6 mg/dL  CBC     Status: Abnormal   Collection Time    07/08/13  4:31 AM      Result Value Range   WBC 10.6 (*) 4.0 - 10.5 K/uL   RBC 3.36 (*) 3.87 - 5.11 MIL/uL   Hemoglobin 8.3 (*) 12.0 - 15.0 g/dL   Comment: DELTA CHECK NOTED     RESULT REPEATED AND VERIFIED   HCT 25.8 (*) 36.0 - 46.0 %   MCV 76.8 (*) 78.0 - 100.0 fL   MCH 24.7 (*) 26.0 - 34.0 pg   MCHC 32.2  30.0 - 36.0 g/dL   RDW 16.1 (*) 09.6 - 04.5 %   Platelets 475 (*) 150 - 400 K/uL  GLUCOSE, CAPILLARY     Status: Abnormal   Collection Time    07/08/13  1:13 PM      Result Value Range   Glucose-Capillary 115 (*) 70 - 99 mg/dL  GLUCOSE, CAPILLARY     Status: Abnormal   Collection Time    07/08/13  4:01 PM      Result Value Range   Glucose-Capillary 118 (*) 70 - 99 mg/dL  GLUCOSE, CAPILLARY     Status: Abnormal   Collection Time    07/08/13  7:27 PM      Result Value Range   Glucose-Capillary 128 (*) 70 - 99 mg/dL   Comment 1 Documented in Chart     Comment 2 Notify RN    GLUCOSE, CAPILLARY     Status: Abnormal   Collection Time    07/08/13 11:48 PM      Result Value Range   Glucose-Capillary 112 (*) 70 - 99 mg/dL   Comment 1 Documented in Chart     Comment 2 Notify RN    GLUCOSE, CAPILLARY     Status: Abnormal   Collection Time    07/09/13  4:16 AM      Result Value Range   Glucose-Capillary 108 (*) 70 - 99 mg/dL  CBC     Status: Abnormal   Collection Time    07/09/13  4:21 AM      Result Value Range   WBC 15.0 (*) 4.0 -  10.5 K/uL   RBC 3.15 (*) 3.87 - 5.11 MIL/uL   Hemoglobin 7.8 (*) 12.0 - 15.0 g/dL   HCT 40.9 (*) 81.1 - 91.4 %   MCV 75.9 (*) 78.0 - 100.0 fL   MCH 24.8 (*) 26.0 - 34.0 pg   MCHC 32.6  30.0 - 36.0 g/dL   RDW 78.2 (*) 95.6 - 21.3 %   Platelets 452 (*) 150 - 400 K/uL  MAGNESIUM     Status: Abnormal   Collection Time    07/09/13  4:21 AM      Result Value Range   Magnesium 2.7 (*) 1.5 - 2.5 mg/dL  PHOSPHORUS     Status: None   Collection Time    07/09/13  4:21 AM      Result Value Range   Phosphorus 3.9  2.3 - 4.6 mg/dL  COMPREHENSIVE METABOLIC PANEL     Status: Abnormal   Collection Time    07/09/13  4:21 AM      Result Value Range   Sodium 135  135 - 145 mEq/L   Potassium 4.4  3.5 - 5.1 mEq/L   Chloride 98  96 - 112 mEq/L   CO2 28  19 - 32 mEq/L   Glucose, Bld 112 (*) 70 - 99 mg/dL   BUN 14  6 - 23 mg/dL   Creatinine, Ser 7.82  0.50 - 1.10 mg/dL   Calcium 9.0  8.4 - 95.6 mg/dL   Total Protein 5.8 (*) 6.0 - 8.3 g/dL   Albumin 1.7 (*) 3.5 - 5.2 g/dL   AST 31  0 - 37 U/L   ALT 136 (*) 0 - 35 U/L   Alkaline Phosphatase 89  39 - 117 U/L   Total Bilirubin 0.5  0.3 - 1.2 mg/dL   GFR calc non Af Amer 68 (*) >90 mL/min   GFR calc Af Amer 79 (*) >90 mL/min   Comment: (NOTE)     The eGFR has been calculated using the CKD EPI equation.     This calculation has not been validated in all clinical situations.     eGFR's persistently <90 mL/min signify possible Chronic Kidney     Disease.  PREALBUMIN     Status: Abnormal   Collection Time    07/09/13  4:21 AM      Result Value Range   Prealbumin 7.8 (*) 17.0 - 34.0 mg/dL   Comment: Performed at [facility]  TRIGLYCERIDES     Status: None   Collection Time    07/09/13  4:21 AM      Result Value Range   Triglycerides 91  <150 mg/dL  DIFFERENTIAL     Status: Abnormal   Collection Time    07/09/13  4:21 AM      Result Value Range   Neutrophils Relative % 91 (*) 43 - 77 %   Neutro Abs 13.6 (*) 1.7 - 7.7 K/uL    Lymphocytes Relative 4 (*) 12 - 46 %   Lymphs Abs 0.6 (*) 0.7 - 4.0 K/uL   Monocytes Relative 5  3 - 12 %   Monocytes Absolute 0.8  0.1 - 1.0 K/uL   Eosinophils Relative 0  0 - 5 %   Eosinophils Absolute 0.0  0.0 - 0.7 K/uL   Basophils Relative 0  0 - 1 %   Basophils Absolute 0.0  0.0 - 0.1 K/uL  GLUCOSE, CAPILLARY     Status: Abnormal   Collection Time    07/09/13  7:43 AM      Result Value Range   Glucose-Capillary 119 (*) 70 - 99 mg/dL  GLUCOSE, CAPILLARY     Status: Abnormal   Collection Time    07/09/13 11:33 AM      Result Value Range   Glucose-Capillary 118 (*) 70 - 99 mg/dL  GLUCOSE, CAPILLARY     Status: None   Collection Time    07/09/13  4:18 PM      Result Value Range   Glucose-Capillary 91  70 - 99 mg/dL  DIC (DISSEMINATED INTRAVASCULAR COAGULATION) PANEL     Status: Abnormal   Collection Time    07/09/13  4:25 PM      Result Value Range   Prothrombin Time 16.3 (*) 11.6 - 15.2 seconds   INR 1.34  0.00 - 1.49   aPTT 39 (*) 24 - 37 seconds   Comment:            IF BASELINE aPTT IS ELEVATED,     SUGGEST PATIENT RISK ASSESSMENT  BE USED TO DETERMINE APPROPRIATE     ANTICOAGULANT THERAPY.   Fibrinogen 1029 (*) 204 - 475 mg/dL   D-Dimer, Quant 78.46 (*) 0.00 - 0.48 ug/mL-FEU   Comment:            AT THE INHOUSE ESTABLISHED CUTOFF     VALUE OF 0.48 ug/mL FEU,     THIS ASSAY HAS BEEN DOCUMENTED     IN THE LITERATURE TO HAVE     A SENSITIVITY AND NEGATIVE     PREDICTIVE VALUE OF AT LEAST     98 TO 99%.  THE TEST RESULT     SHOULD BE CORRELATED WITH     AN ASSESSMENT OF THE CLINICAL     PROBABILITY OF DVT / VTE.   Platelets 404 (*) 150 - 400 K/uL   Smear Review NO SCHISTOCYTES SEEN        RADIOGRAPHY: Ir US Guide Vasc Access Right  07/09/2013   *RADIOLOGY REPORT*  Indication: In need of venous access for TPN administration  ULTRASOUND AND FLUORSCOPIC GUIDED PICC LINE INSERTION  Intravenous Medications: None  Contrast: None  Fluoroscopy Time:  18 seconds   Complications: None immediate  Technique / Findings:  The procedure, risks, benefits, and alternatives were explained to the patient and informed written consent was obtained.  A timeout was performed prior to the initiation of the procedure.  The right upper extremity was prepped with chlorhexidine in a sterile fashion, and a sterile drape was applied covering the operative field.  Maximum barrier sterile technique with sterile gowns and gloves were used for the procedure.  A timeout was performed prior to the initiation of the procedure.  Local anesthesia was provided with 1% lidocaine.  Under direct ultrasound guidance, the rightbasilicvein was accessed with a micropuncture kit after the overlying soft tissues were anesthetized with 1% lidocaine.  An ultrasound image was saved for documentation purposes.  A guidewire was advanced to the level of the superior caval-atrial junction for measurement purposes and the PICC line was cut to length.  A peel-away sheath was placed and a 41 cm, 5 [place], dual lumen was inserted to level of the superior caval-atrial junction.  A post procedure spot fluoroscopic was obtained.  The catheter easily aspirated and flushed and was sutured in place.  A dressing was placed.  The patient tolerated the procedure well without immediate post procedural complication.  Impression:  Successful ultrasound and fluoroscopic guided placement of a right basilic vein approach, 41 cm, 5 French,dual lumen PICC with tip at the superior caval-atrial junction.  The PICC line is ready for immediate use.   Original Report Authenticated By: Tacey Ruiz, MD   Dg Chest Port 1 View  07/08/2013   CLINICAL DATA:  Left side PICC line placement  EXAM: PORTABLE CHEST - 1 VIEW  COMPARISON:  Portable exam 1202 hr compared to 07/06/2013  FINDINGS: Left subclavian Port-A-Cath with tip projecting over SVC.  Left arm PICC line is coiled in the subclavian vein with the tip directed antegrade at the left  brachiocephalic vein left of midline.  Enlargement of cardiac silhouette.  Minimal pulmonary vascular congestion.  Lungs grossly clear.  No pleural effusion or pneumothorax.  IMPRESSION: Left arm PICC line is coiled in the left subclavian vein; recommend repositioning or replacement.  Findings discussed with Victorino Dike RN from the PICC team on 07/08/2013 at 1221 hr.   Electronically Signed   By: Ulyses Southward M.D.   On: 07/08/2013 12:22   Ir Fluoro  Guide Cv Midline Picc Left  07/09/2013   *RADIOLOGY REPORT*  Indication: In need of venous access for TPN administration  ULTRASOUND AND FLUORSCOPIC GUIDED PICC LINE INSERTION  Intravenous Medications: None  Contrast: None  Fluoroscopy Time:  18 seconds  Complications: None immediate  Technique / Findings:  The procedure, risks, benefits, and alternatives were explained to the patient and informed written consent was obtained.  A timeout was performed prior to the initiation of the procedure.  The right upper extremity was prepped with chlorhexidine in a sterile fashion, and a sterile drape was applied covering the operative field.  Maximum barrier sterile technique with sterile gowns and gloves were used for the procedure.  A timeout was performed prior to the initiation of the procedure.  Local anesthesia was provided with 1% lidocaine.  Under direct ultrasound guidance, the rightbasilicvein was accessed with a micropuncture kit after the overlying soft tissues were anesthetized with 1% lidocaine.  An ultrasound image was saved for documentation purposes.  A guidewire was advanced to the level of the superior caval-atrial junction for measurement purposes and the PICC line was cut to length.  A peel-away sheath was placed and a 41 cm, 5 [place], dual lumen was inserted to level of the superior caval-atrial junction.  A post procedure spot fluoroscopic was obtained.  The catheter easily aspirated and flushed and was sutured in place.  A dressing was placed.  The patient  tolerated the procedure well without immediate post procedural complication.  Impression:  Successful ultrasound and fluoroscopic guided placement of a right basilic vein approach, 41 cm, 5 French,dual lumen PICC with tip at the superior caval-atrial junction.  The PICC line is ready for immediate use.   Original Report Authenticated By: Tacey Ruiz, MD        ASSESSMENT:  1. Gastric perforation, requiring surgical repair with patch 2. 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 Right PICC placed for parenteral nutrition, mild bleeding at site 4. DIC panel negative for DIC.  No intervention necessary.   PLAN:  1. DIC panel STAT due to bleeding after PICC placed and recent history of gastric perforation.  Results reviewed.  DIC panel is negative.  Abnormalities in panel are reactive to recent perforation, surgery, and malignancy.  No intervention necessary 2. Will cancel chemotherapy on Monday 10/6 3. Defer medical management to attending 4. Will follow along while in the hospital.  All questions were answered. The patient knows to call the clinic with any problems, questions or concerns. We can certainly see the patient much sooner if necessary.  Patient and plan discussed with Dr. Alla German and he is in agreement with the aforementioned.    Tashon Capp

## 2013-07-09 NOTE — Progress Notes (Signed)
2 Days Post-Op  Subjective: Has just returned from having PICC placed by interventional radiology. She has no complaints.  Objective: Vital signs in last 24 hours: Temp:  [98.1 F (36.7 C)-98.8 F (37.1 C)] 98.7 F (37.1 C) (10/03 0400) Pulse Rate:  [79-95] 80 (10/03 0700) Resp:  [17-22] 21 (10/03 0700) BP: (101-144)/(44-78) 134/65 mmHg (10/03 0700) SpO2:  [93 %-98 %] 93 % (10/03 0700) FiO2 (%):  [95 %] 95 % (10/03 0657) Weight:  [96.752 kg (213 lb 4.8 oz)] 96.752 kg (213 lb 4.8 oz) (10/03 0500) Last BM Date: 07/09/13  Intake/Output from previous day: 10/02 0701 - 10/03 0700 In: 2600 [I.V.:2400; IV Piggyback:200] Out: 1190 [Urine:1100; Emesis/NG output:40; Drains:50] Intake/Output this shift: Total I/O In: 300 [I.V.:300] Out: -   General appearance: alert, cooperative and no distress Resp: clear to auscultation bilaterally Cardio: regular rate and rhythm, S1, S2 normal, no murmur, click, rub or gallop GI: Soft. Dressing dry and intact. JP drainage serous in nature.  Lab Results:   Recent Labs  07/08/13 0431 07/09/13 0421  WBC 10.6* 15.0*  HGB 8.3* 7.8*  HCT 25.8* 23.9*  PLT 475* 452*   BMET  Recent Labs  07/08/13 0431 07/09/13 0421  NA 134* 135  K 5.0 4.4  CL 98 98  CO2 28 28  GLUCOSE 162* 112*  BUN 17 14  CREATININE 0.85 0.83  CALCIUM 8.4 9.0   PT/INR No results found for this basename: LABPROT, INR,  in the last 72 hours  Studies/Results: Ct Abdomen Pelvis W Contrast  07/07/2013   CLINICAL DATA:  Abdominal pain and melena for 2 days, hematochezia, history ulcer disease, appendectomy, adrenal adenoma, diverticulosis, smoking, gastric cancer  EXAM: CT ABDOMEN AND PELVIS WITH CONTRAST  TECHNIQUE: Multidetector CT imaging of the abdomen and pelvis was performed using the standard protocol following bolus administration of intravenous contrast. Sagittal and coronal MPR images reconstructed from axial data set.  CONTRAST:  50mL OMNIPAQUE IOHEXOL 300 MG/ML  SOLN, OMNIPAQUE IOHEXOL 300 MG/ML SOLN  COMPARISON:  06/04/2013, PET-CT 06/24/2013  FINDINGS: Dependent bibasilar atelectasis.  Liver, spleen, pancreas, right kidney and right adrenal gland normal appearance.  Small cyst left kidney 18 x 14 mm image 28.  Stable bilateral adrenal nodules, larger on left, with washout characteristics consistent with adrenal adenoma.  Free intraperitoneal air in fluid identified in the upper abdomen with free fluid in the pelvis consistent with perforated viscus.  Diffuse gastric wall thickening consistent with known gastric carcinoma.  Focal gas is identified at the anterior margin of the stomach adjacent to the left lobe of the liver, suspect anterior gastric perforation.  Segment of questionable wall thickening of the mid descending colon versus artifact.  Remaining bowel loops normal appearance.  Beam hardening artifacts from left hip prosthesis obscure portions of pelvis, with visualized bladder unremarkable.  No additional mass or adenopathy.  Scattered degenerative disc disease changes thoracolumbar spine greatest at L3-L4.  IMPRESSION: Free intraperitoneal air and fluid compatible with perforated viscus, suspect from anterior wall of the proximal to mid stomach.  Questionable segment of colonic wall thickening at the mid descending colon versus underdistention artifact, cannot exclude segment of colitis.  Stable adrenal nodules and left renal cyst.  Critical Value/emergent results were called by telephone at the time of interpretation on 07/07/2013 at 5:35 PM to Dr.JOHN [facility] , who verbally acknowledged these results.   Electronically Signed   By: Ulyses Southward M.D.   On: 07/07/2013 17:36   Ir US Guide Vasc Access  Right  07/09/2013   *RADIOLOGY REPORT*  Indication: In need of venous access for TPN administration  ULTRASOUND AND FLUORSCOPIC GUIDED PICC LINE INSERTION  Intravenous Medications: None  Contrast: None  Fluoroscopy Time:  18 seconds  Complications: None  immediate  Technique / Findings:  The procedure, risks, benefits, and alternatives were explained to the patient and informed written consent was obtained.  Frost timeout was performed prior to the initiation of the procedure.  The right upper extremity was prepped with chlorhexidine in Frost sterile fashion, and Frost sterile drape was applied covering the operative field.  Maximum barrier sterile technique with sterile gowns and gloves were used for the procedure.  Frost timeout was performed prior to the initiation of the procedure.  Local anesthesia was provided with 1% lidocaine.  Under direct ultrasound guidance, the rightbasilicvein was accessed with Frost micropuncture kit after the overlying soft tissues were anesthetized with 1% lidocaine.  An ultrasound image was saved for documentation purposes.  Frost guidewire was advanced to the level of the superior caval-atrial junction for measurement purposes and the PICC line was cut to length.  Frost peel-away sheath was placed and Frost 41 cm, 5 [place], dual lumen was inserted to level of the superior caval-atrial junction.  Frost post procedure spot fluoroscopic was obtained.  The catheter easily aspirated and flushed and was sutured in place.  Frost dressing was placed.  The patient tolerated the procedure well without immediate post procedural complication.  Impression:  Successful ultrasound and fluoroscopic guided placement of Frost right basilic vein approach, 41 cm, 5 French,dual lumen PICC with tip at the superior caval-atrial junction.  The PICC line is ready for immediate use.   Original Report Authenticated By: Tacey Ruiz, MD   Dg Chest Port 1 View  07/08/2013   CLINICAL DATA:  Left side PICC line placement  EXAM: PORTABLE CHEST - 1 VIEW  COMPARISON:  Portable exam 1202 hr compared to 07/06/2013  FINDINGS: Left subclavian Port-Frost-Cath with tip projecting over SVC.  Left arm PICC line is coiled in the subclavian vein with the tip directed antegrade at the left brachiocephalic vein left of  midline.  Enlargement of cardiac silhouette.  Minimal pulmonary vascular congestion.  Lungs grossly clear.  No pleural effusion or pneumothorax.  IMPRESSION: Left arm PICC line is coiled in the left subclavian vein; recommend repositioning or replacement.  Findings discussed with Victorino Dike RN from the PICC team on 07/08/2013 at 1221 hr.   Electronically Signed   By: Ulyses Southward M.D.   On: 07/08/2013 12:22   Ir Fluoro Guide Cv Midline Picc Left  07/09/2013   *RADIOLOGY REPORT*  Indication: In need of venous access for TPN administration  ULTRASOUND AND FLUORSCOPIC GUIDED PICC LINE INSERTION  Intravenous Medications: None  Contrast: None  Fluoroscopy Time:  18 seconds  Complications: None immediate  Technique / Findings:  The procedure, risks, benefits, and alternatives were explained to the patient and informed written consent was obtained.  Frost timeout was performed prior to the initiation of the procedure.  The right upper extremity was prepped with chlorhexidine in Frost sterile fashion, and Frost sterile drape was applied covering the operative field.  Maximum barrier sterile technique with sterile gowns and gloves were used for the procedure.  Frost timeout was performed prior to the initiation of the procedure.  Local anesthesia was provided with 1% lidocaine.  Under direct ultrasound guidance, the rightbasilicvein was accessed with Frost micropuncture kit after the overlying soft tissues were anesthetized with  1% lidocaine.  An ultrasound image was saved for documentation purposes.  Frost guidewire was advanced to the level of the superior caval-atrial junction for measurement purposes and the PICC line was cut to length.  Frost peel-away sheath was placed and Frost 41 cm, 5 [place], dual lumen was inserted to level of the superior caval-atrial junction.  Frost post procedure spot fluoroscopic was obtained.  The catheter easily aspirated and flushed and was sutured in place.  Frost dressing was placed.  The patient tolerated the procedure well  without immediate post procedural complication.  Impression:  Successful ultrasound and fluoroscopic guided placement of Frost right basilic vein approach, 41 cm, 5 French,dual lumen PICC with tip at the superior caval-atrial junction.  The PICC line is ready for immediate use.   Original Report Authenticated By: Tacey Ruiz, MD    Anti-infectives: Anti-infectives   Start     Dose/Rate Route Frequency Ordered Stop   07/08/13 0200  piperacillin-tazobactam (ZOSYN) IVPB 3.375 g     3.375 g 12.5 mL/hr over 240 Minutes Intravenous Every 8 hours 07/07/13 2327     07/07/13 1930  clindamycin (CLEOCIN) IVPB 900 mg     900 mg 100 mL/hr over 30 Minutes Intravenous  Once 07/07/13 1923 07/07/13 2018   07/07/13 1830  piperacillin-tazobactam (ZOSYN) IVPB 3.375 g     3.375 g 12.5 mL/hr over 240 Minutes Intravenous  Once 07/07/13 1818 07/07/13 1949      Assessment/Plan: s/p Procedure(s): EXPLORATORY LAPAROTOMY GASTRORRHAPHY Impression: Stable on postoperative day 2. She does have an increase in her white blood cell count which is not unexpected. We'll start TPN today. We'll continue to monitor over the next 24-48 hours in ICU. Plan to do die study via NG tube on Monday to assess repair of gastric perforation. Final pathology is still pending.  LOS: 2 days    [name] 07/09/2013

## 2013-07-09 NOTE — Care Management Note (Addendum)
    Page 1 of 2   07/15/2013     9:18:22 AM   CARE MANAGEMENT NOTE 07/15/2013  Patient:  [name], [name]   Account Number:  [id number]  Date Initiated:  07/09/2013  Documentation initiated by:  Sharrie Rothman  Subjective/Objective Assessment:   Pt admitted from home s/p exp lap for a gastric perforation. Pt has been living at home alone prior to hospitalization. Pt is independent with ADL's. Pt has a very supportive family and children who are very attentive to pt.     Action/Plan:   CM spoke to pt about D/c needs and equipment needs. Pt did state that if she needs HH she would like [facility] and she would need shower chair. Will continue to follow.   Anticipated DC Date:  07/16/2013   Anticipated DC Plan:  HOME W HOME HEALTH SERVICES      DC Planning Services  CM consult      [facility] Choice  HOME HEALTH   Choice offered to / List presented to:  C-1 Patient        HH arranged  HH-1 RN  HH-2 PT  HH-4 NURSE'S AIDE      HH agency  [facility]   Status of service:  Completed, signed off Medicare Important Message given?  YES (If response is "NO", the following Medicare IM given date fields will be blank) Date Medicare IM given:  07/15/2013 Date Additional Medicare IM given:    Discharge Disposition:  HOME W HOME HEALTH SERVICES  Per UR Regulation:    If discussed at Long Length of Stay Meetings, dates discussed:   07/13/2013  07/15/2013    Comments:  07/15/13 0915 Arlyss Queen, RN BSN CM Pt discharged home today with [facility] HH. Alroy Bailiff of [facility] is aware and will collect the pts information from the chart. HH services to start within 48 hours. Pt also needs rollator and requests [facility] to deliver to pts room prior to discharge. No other CM needs noted. Pt and pts nurse aware of discharge arrangements. Pt going to stay with her son for a few weeks after discharge. 07/13/13 1450 Arlyss Queen, RN BSN CM Pt now eating soft diet. TPN stopped. Pt for discharge 24-48  hours. Will follow for any HH needs.  07/09/13 1420 Arlyss Queen, RN BSN CM

## 2013-07-09 NOTE — Procedures (Signed)
Successful placement of right basilic vein approach dual lumen PICC line with tip at the superior caval-atrial junction.  The PICC line is ready for immediate use. 

## 2013-07-09 NOTE — Progress Notes (Signed)
Dr Lovell Sheehan called. Pt returned from [facility] after PICC line placement around 1200. PICC line dressing noted to be saturated with blood. PICC line dressing has been changed 3 times. Pressure dressing and sand bag have been placed. Dr Lovell Sheehan made aware. Verbal order to wrap area in ACE wrap. Arm wrapped in ACE bandage and sand bag applied. Will continue to monitor.

## 2013-07-09 NOTE — Progress Notes (Signed)
PARENTERAL NUTRITION CONSULT NOTE  Pharmacy Consult for TPN Indication: NPO s/p gastric surgery  No Known Allergies  Patient Measurements: Height: 5\' 6"  (167.6 cm) Weight: 213 lb 4.8 oz (96.752 kg) IBW/kg (Calculated) : 59.3 Adjusted Body Weight: 75 kg Usual Weight: 95 kg  Vital Signs: Temp: 98.7 F (37.1 C) (10/03 0400) Temp src: Oral (10/03 0400) BP: 134/65 mmHg (10/03 0700) Pulse Rate: 80 (10/03 0700) Intake/Output from previous day: 10/02 0701 - 10/03 0700 In: 2600 [I.V.:2400; IV Piggyback:200] Out: 1190 [Urine:1100; Emesis/NG output:40; Drains:50] Intake/Output from this shift: Total I/O In: 300 [I.V.:300] Out: -   Labs:  Recent Labs  07/07/13 1354 07/08/13 0431 07/09/13 0421  WBC 10.7* 10.6* 15.0*  HGB 10.2* 8.3* 7.8*  HCT 31.4* 25.8* 23.9*  PLT 606* 475* 452*     Recent Labs  07/07/13 1354 07/08/13 0431 07/09/13 0421  NA 134* 134* 135  K 4.5 5.0 4.4  CL 96 98 98  CO2 31 28 28   GLUCOSE 200* 162* 112*  BUN 17 17 14   CREATININE 0.78 0.85 0.83  CALCIUM 9.9 8.4 9.0  MG  --  2.4 2.7*  PHOS  --  4.0 3.9  PROT 7.5  --  5.8*  ALBUMIN 2.6*  --  1.7*  AST 130*  --  31  ALT 291*  --  136*  ALKPHOS 138*  --  89  BILITOT 0.2*  --  0.5  TRIG  --   --  91   Estimated Creatinine Clearance: 70.8 ml/min (by C-G formula based on Cr of 0.83).    Recent Labs  07/08/13 2348 07/09/13 0416 07/09/13 0743  GLUCAP 112* 108* 119*    Medical History: Past Medical History  Diagnosis Date  . Hypertension   . Paroxysmal SVT (supraventricular tachycardia)   . Hypercholesteremia   . GERD (gastroesophageal reflux disease)   . PUD (peptic ulcer disease)   . DVT (deep venous thrombosis) 2007  . Adrenal adenoma     left  . Thyroid nodule     left  . H. pylori infection   . Diverticulosis   . 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 Diarrhea     recurrent  . Arthritis   . Hiatal hernia   . Cancer     adenocarcinoma     Medications:  Scheduled:  . enoxaparin (LOVENOX) injection  30 mg Subcutaneous Q24H  . insulin aspart  0-15 Units Subcutaneous Q4H  . metoprolol  2.5 mg Intravenous Q6H  . pantoprazole (PROTONIX) IV  40 mg Intravenous QHS  . piperacillin-tazobactam (ZOSYN)  IV  3.375 g Intravenous Q8H    Insulin Requirements in the past 24 hours:  2 units  Current Nutrition:  NPO  Assessment: 72 yo F with new dx gastric CA.  She was admitted with gastric carcinoma perforation s/p repair.  She remains NPO.  Her electrolytes are normal. Her renal function is good.  She has no history of diabetes, however blood glucose has been elevated this admission.  They are now <150 with addition of SSI.   PICC line was placed today so will initiate TPN.   Nutritional Goals:  1500 kCal/day, 150 grams of protein per day  Plan:  1) Start TPN once PICC line placed.  Will initiate Clinimix E 5/15 at 13ml/hr & titrate as tolerated to goal rate of 45ml/hr.  Will add MVI/minerals to TPN daily. 2) Intralipids on MWF 3) Continue CBG monitoring & moderate sliding scale  insulin as needed.  4) 4) Monitor CBC, CBGs, electrolytes, & TGs per protocol  Kytzia Gienger, [facility] 07/09/2013,10:16 AM

## 2013-07-09 NOTE — Progress Notes (Signed)
eLink Physician-Brief Progress Note Patient Name: [name] DOB: 1940/12/28 MRN: [id number]  Date of Service  07/09/2013   HPI/Events of Note   Oozing at the [facility] insertion site   eICU Interventions  Thrombin dressing ordered      [facility] 07/09/2013, 11:48 PM

## 2013-07-10 LAB — CBC
HCT: 21.3 % — ABNORMAL LOW (ref 36.0–46.0)
Hemoglobin: 6.9 g/dL — CL (ref 12.0–15.0)
MCH: 24.5 pg — ABNORMAL LOW (ref 26.0–34.0)
MCHC: 32.4 g/dL (ref 30.0–36.0)
MCV: 75.5 fL — ABNORMAL LOW (ref 78.0–100.0)
Platelets: 405 K/uL — ABNORMAL HIGH (ref 150–400)
RBC: 2.82 MIL/uL — ABNORMAL LOW (ref 3.87–5.11)
RDW: 15.9 % — ABNORMAL HIGH (ref 11.5–15.5)
WBC: 12.6 K/uL — ABNORMAL HIGH (ref 4.0–10.5)

## 2013-07-10 LAB — BASIC METABOLIC PANEL WITH GFR
BUN: 11 mg/dL (ref 6–23)
CO2: 30 meq/L (ref 19–32)
Calcium: 8.6 mg/dL (ref 8.4–10.5)
Chloride: 99 meq/L (ref 96–112)
Creatinine, Ser: 0.71 mg/dL (ref 0.50–1.10)
GFR calc Af Amer: >90 mL/min
GFR calc non Af Amer: 84 mL/min — ABNORMAL LOW
Glucose, Bld: 109 mg/dL — ABNORMAL HIGH (ref 70–99)
Potassium: 3.4 meq/L — ABNORMAL LOW (ref 3.5–5.1)
Sodium: 137 meq/L (ref 135–145)

## 2013-07-10 LAB — GLUCOSE, CAPILLARY
Glucose-Capillary: 103 mg/dL — ABNORMAL HIGH (ref 70–99)
Glucose-Capillary: 122 mg/dL — ABNORMAL HIGH (ref 70–99)
Glucose-Capillary: 83 mg/dL (ref 70–99)
Glucose-Capillary: 98 mg/dL (ref 70–99)

## 2013-07-10 MED ORDER — THROMBIN 20000 UNITS EX KIT: 20000.0000 [IU] | PACK | Freq: Once | CUTANEOUS | Status: DC

## 2013-07-10 MED ORDER — LORAZEPAM 2 MG/ML IJ SOLN
1.0000 mg | INTRAMUSCULAR | Status: DC | PRN
  Administered 2013-07-10 – 2013-07-13 (×6): 1 mg via INTRAVENOUS
  Filled 2013-07-10 (×7): qty 1

## 2013-07-10 NOTE — Progress Notes (Signed)
3 Days Post-Op  Subjective: No abdominal pain. Patient has had multiple bowel movements. Had problems with the PICC line with bleeding at the insertion site.   Objective: Vital signs in last 24 hours: Temp:  [97.8 F (36.6 C)-99.3 F (37.4 C)] 97.8 F (36.6 C) (10/04 0950) Pulse Rate:  [54-101] 78 (10/04 1000) Resp:  [18-27] 22 (10/04 0900) BP: (113-159)/(42-100) 159/76 mmHg (10/04 1000) SpO2:  [94 %-99 %] 96 % (10/04 1000) Weight:  [93.668 kg (206 lb 8 oz)] 93.668 kg (206 lb 8 oz) (10/04 0500) Last BM Date: 07/10/13  Intake/Output from previous day: 10/03 0701 - 10/04 0700 In: 2718.3 [P.O.:530; I.V.:1683.8; Blood:12.5; IV Piggyback:150; TPN:342] Out: 2095 [Urine:1550; Emesis/NG output:235; Drains:310] Intake/Output this shift: Total I/O In: 368.8 [Blood:368.8] Out: 200 [Urine:200]  General appearance: alert, cooperative and fatigued Resp: clear to auscultation bilaterally Cardio: regular rate and rhythm, S1, S2 normal, no murmur, click, rub or gallop GI: Soft. Bowel sounds active. JP drainage serous in nature. Incision healing well.  Lab Results:   Recent Labs  07/09/13 0421 07/09/13 1625 07/09/13 2042 07/10/13 0517  WBC 15.0*  --   --  12.6*  HGB 7.8*  --  7.6* 6.9*  HCT 23.9*  --  23.1* 21.3*  PLT 452* 404*  --  405*   BMET  Recent Labs  07/09/13 0421 07/10/13 0517  NA 135 137  K 4.4 3.4*  CL 98 99  CO2 28 30  GLUCOSE 112* 109*  BUN 14 11  CREATININE 0.83 0.71  CALCIUM 9.0 8.6   PT/INR  Recent Labs  07/09/13 1625  LABPROT 16.3*  INR 1.34    Studies/Results: Ir US Guide Vasc Access Right  07/09/2013   *RADIOLOGY REPORT*  Indication: In need of venous access for TPN administration  ULTRASOUND AND FLUORSCOPIC GUIDED PICC LINE INSERTION  Intravenous Medications: None  Contrast: None  Fluoroscopy Time:  18 seconds  Complications: None immediate  Technique / Findings:  The procedure, risks, benefits, and alternatives were explained to the patient and  informed written consent was obtained.  Frost timeout was performed prior to the initiation of the procedure.  The right upper extremity was prepped with chlorhexidine in Frost sterile fashion, and Frost sterile drape was applied covering the operative field.  Maximum barrier sterile technique with sterile gowns and gloves were used for the procedure.  Frost timeout was performed prior to the initiation of the procedure.  Local anesthesia was provided with 1% lidocaine.  Under direct ultrasound guidance, the rightbasilicvein was accessed with Frost micropuncture kit after the overlying soft tissues were anesthetized with 1% lidocaine.  An ultrasound image was saved for documentation purposes.  Frost guidewire was advanced to the level of the superior caval-atrial junction for measurement purposes and the PICC line was cut to length.  Frost peel-away sheath was placed and Frost 41 cm, 5 [place], dual lumen was inserted to level of the superior caval-atrial junction.  Frost post procedure spot fluoroscopic was obtained.  The catheter easily aspirated and flushed and was sutured in place.  Frost dressing was placed.  The patient tolerated the procedure well without immediate post procedural complication.  Impression:  Successful ultrasound and fluoroscopic guided placement of Frost right basilic vein approach, 41 cm, 5 French,dual lumen PICC with tip at the superior caval-atrial junction.  The PICC line is ready for immediate use.   Original Report Authenticated By: Tacey Ruiz, MD   Dg Chest Port 1 View  07/08/2013   CLINICAL DATA:  Left side PICC line placement  EXAM: PORTABLE CHEST - 1 VIEW  COMPARISON:  Portable exam 1202 hr compared to 07/06/2013  FINDINGS: Left subclavian Port-Frost-Cath with tip projecting over SVC.  Left arm PICC line is coiled in the subclavian vein with the tip directed antegrade at the left brachiocephalic vein left of midline.  Enlargement of cardiac silhouette.  Minimal pulmonary vascular congestion.  Lungs grossly clear.  No pleural  effusion or pneumothorax.  IMPRESSION: Left arm PICC line is coiled in the left subclavian vein; recommend repositioning or replacement.  Findings discussed with Victorino Dike RN from the PICC team on 07/08/2013 at 1221 hr.   Electronically Signed   By: Ulyses Southward M.D.   On: 07/08/2013 12:22   Ir Fluoro Guide Cv Midline Picc Left  07/09/2013   *RADIOLOGY REPORT*  Indication: In need of venous access for TPN administration  ULTRASOUND AND FLUORSCOPIC GUIDED PICC LINE INSERTION  Intravenous Medications: None  Contrast: None  Fluoroscopy Time:  18 seconds  Complications: None immediate  Technique / Findings:  The procedure, risks, benefits, and alternatives were explained to the patient and informed written consent was obtained.  Frost timeout was performed prior to the initiation of the procedure.  The right upper extremity was prepped with chlorhexidine in Frost sterile fashion, and Frost sterile drape was applied covering the operative field.  Maximum barrier sterile technique with sterile gowns and gloves were used for the procedure.  Frost timeout was performed prior to the initiation of the procedure.  Local anesthesia was provided with 1% lidocaine.  Under direct ultrasound guidance, the rightbasilicvein was accessed with Frost micropuncture kit after the overlying soft tissues were anesthetized with 1% lidocaine.  An ultrasound image was saved for documentation purposes.  Frost guidewire was advanced to the level of the superior caval-atrial junction for measurement purposes and the PICC line was cut to length.  Frost peel-away sheath was placed and Frost 41 cm, 5 [place], dual lumen was inserted to level of the superior caval-atrial junction.  Frost post procedure spot fluoroscopic was obtained.  The catheter easily aspirated and flushed and was sutured in place.  Frost dressing was placed.  The patient tolerated the procedure well without immediate post procedural complication.  Impression:  Successful ultrasound and fluoroscopic guided placement of  Frost right basilic vein approach, 41 cm, 5 French,dual lumen PICC with tip at the superior caval-atrial junction.  The PICC line is ready for immediate use.   Original Report Authenticated By: Tacey Ruiz, MD    Anti-infectives: Anti-infectives   Start     Dose/Rate Route Frequency Ordered Stop   07/08/13 0200  piperacillin-tazobactam (ZOSYN) IVPB 3.375 g     3.375 g 12.5 mL/hr over 240 Minutes Intravenous Every 8 hours 07/07/13 2327     07/07/13 1930  clindamycin (CLEOCIN) IVPB 900 mg     900 mg 100 mL/hr over 30 Minutes Intravenous  Once 07/07/13 1923 07/07/13 2018   07/07/13 1830  piperacillin-tazobactam (ZOSYN) IVPB 3.375 g     3.375 g 12.5 mL/hr over 240 Minutes Intravenous  Once 07/07/13 1818 07/07/13 1949      Assessment/Plan: s/p Procedure(s): EXPLORATORY LAPAROTOMY GASTRORRHAPHY Impression: Anemia secondary to acute blood loss as well as 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 PICC line had to be removed due to uncontrolled bleeding from insertion site. We'll place central line tomorrow and resume TPN. One unit of packed red blood cells being given for Frost hemoglobin of 7. Final pathology still pending.  LOS: 3 days    [name] 07/10/2013

## 2013-07-10 NOTE — Progress Notes (Signed)
PARENTERAL NUTRITION CONSULT NOTE  Pharmacy Consult for TPN Indication: NPO s/p gastric surgery  No Known Allergies  Patient Measurements: Height: 5\' 6"  (167.6 cm) Weight: 206 lb 8 oz (93.668 kg) IBW/kg (Calculated) : 59.3 Adjusted Body Weight: 75 kg Usual Weight: 95 kg  Vital Signs: Temp: 97.9 F (36.6 C) (10/04 0853) Temp src: Axillary (10/04 0853) BP: 151/78 mmHg (10/04 0900) Pulse Rate: 85 (10/04 0900) Intake/Output from previous day: 10/03 0701 - 10/04 0700 In: 2718.3 [P.O.:530; I.V.:1683.8; Blood:12.5; IV Piggyback:150; TPN:342] Out: 2095 [Urine:1550; Emesis/NG output:235; Drains:310] Intake/Output from this shift: Total I/O In: 250 [Blood:250] Out: 200 [Urine:200]  Labs:  Recent Labs  07/08/13 0431 07/09/13 0421 07/09/13 1625 07/09/13 2042 07/10/13 0517  WBC 10.6* 15.0*  --   --  12.6*  HGB 8.3* 7.8*  --  7.6* 6.9*  HCT 25.8* 23.9*  --  23.1* 21.3*  PLT 475* 452* 404*  --  405*  APTT  --   --  39*  --   --   INR  --   --  1.34  --   --      Recent Labs  07/07/13 1354 07/08/13 0431 07/09/13 0421 07/10/13 0517  NA 134* 134* 135 137  K 4.5 5.0 4.4 3.4*  CL 96 98 98 99  CO2 31 28 28 30   GLUCOSE 200* 162* 112* 109*  BUN 17 17 14 11   CREATININE 0.78 0.85 0.83 0.71  CALCIUM 9.9 8.4 9.0 8.6  MG  --  2.4 2.7*  --   PHOS  --  4.0 3.9  --   PROT 7.5  --  5.8*  --   ALBUMIN 2.6*  --  1.7*  --   AST 130*  --  31  --   ALT 291*  --  136*  --   ALKPHOS 138*  --  89  --   BILITOT 0.2*  --  0.5  --   PREALBUMIN  --   --  7.8*  --   TRIG  --   --  91  --    Estimated Creatinine Clearance: 72.3 ml/min (by C-G formula based on Cr of 0.71).    Recent Labs  07/10/13 0001 07/10/13 0309 07/10/13 0745  GLUCAP 122* 112* 98    Medical History: Past Medical History  Diagnosis Date  . Hypertension   . Paroxysmal SVT (supraventricular tachycardia)   . Hypercholesteremia   . GERD (gastroesophageal reflux disease)   . PUD (peptic ulcer disease)   .  DVT (deep venous thrombosis) 2007  . Adrenal adenoma     left  . Thyroid nodule     left  . H. pylori infection   . Diverticulosis   . 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 Diarrhea     recurrent  . Arthritis   . Hiatal hernia   . Cancer     adenocarcinoma    Medications:  Scheduled:  . insulin aspart  0-15 Units Subcutaneous Q4H  . metoprolol  2.5 mg Intravenous Q6H  . pantoprazole (PROTONIX) IV  40 mg Intravenous QHS  . piperacillin-tazobactam (ZOSYN)  IV  3.375 g Intravenous Q8H    Insulin Requirements in the past 24 hours:  2 units  Current Nutrition:  NPO  Assessment: 72 yo F with new dx gastric CA.  She was admitted with gastric carcinoma perforation s/p repair.  She remains NPO.  Her electrolytes are normal. Her renal function is good.  She has no history of diabetes, however blood glucose has been elevated this admission.  They are now <150 with addition of SSI.   PICC line was placed today so will initiate TPN.  PICC line removed last evening, central line to be placed Sunday (07/11/2013) and restart TPN at that time  Nutritional Goals:  1500 kCal/day, 150 grams of protein per day  Plan:  1) Start TPN once central  line placed.  Will initiate Clinimix E 5/15 at 69ml/hr & titrate as tolerated to goal rate of 60ml/hr.  Will add MVI/minerals to TPN daily. 2) Intralipids on MWF 3) Continue CBG monitoring & moderate sliding scale insulin as needed.  4) 4) Monitor CBC, CBGs, electrolytes, & TGs per protocol  Raquel James, Sashay Felling Bennett 07/10/2013,9:18 AM

## 2013-07-10 NOTE — Progress Notes (Signed)
Pt's PICC site reassessed around 2100. Bleeding was contained by the transparent dressing. Assessment findings and H&H results relayed to Dr. Lovell Sheehan. Instructed to continue with compression wrap at site as well as continue with pressure from a sand bag. Instructions followed. At next assessment around 2230, there was blood at the PICC insertion site that had flowed out of the transparent dressing onto an incontinence pad as well as the sheet and bed pad. E-link contacted. Orders given to apply thrombin pad at PICC site. Orders executed around midnight. Upon reassessment around 0200, the PICC site had again bled through the thrombin dressing, the transparent dressing, the compression wrap, and incontinence pad, and onto the bed linens. Dr. Lovell Sheehan notified, and the PICC line was removed. Site was dressed with sterile gauze, transparent dressing, and compression wrap.

## 2013-07-11 ENCOUNTER — Inpatient Hospital Stay (HOSPITAL_COMMUNITY): Payer: Medicare Other

## 2013-07-11 LAB — TYPE AND SCREEN
ABO/RH(D): O POS
Unit division: 0

## 2013-07-11 LAB — GLUCOSE, CAPILLARY
Glucose-Capillary: 71 mg/dL (ref 70–99)
Glucose-Capillary: 72 mg/dL (ref 70–99)
Glucose-Capillary: 116 mg/dL — ABNORMAL HIGH (ref 70–99)
Glucose-Capillary: 133 mg/dL — ABNORMAL HIGH (ref 70–99)

## 2013-07-11 LAB — CBC
Hemoglobin: 8.4 g/dL — ABNORMAL LOW (ref 12.0–15.0)
MCHC: 32.8 g/dL (ref 30.0–36.0)
MCV: 77.6 fL — ABNORMAL LOW (ref 78.0–100.0)
Platelets: 408 10*3/uL — ABNORMAL HIGH (ref 150–400)
RBC: 3.3 MIL/uL — ABNORMAL LOW (ref 3.87–5.11)
RDW: 16.1 % — ABNORMAL HIGH (ref 11.5–15.5)

## 2013-07-11 LAB — BASIC METABOLIC PANEL
Calcium: 8.7 mg/dL (ref 8.4–10.5)
GFR calc Af Amer: >90 mL/min (ref >90)
GFR calc non Af Amer: 86 mL/min — ABNORMAL LOW (ref >90)
Potassium: 3.3 mEq/L — ABNORMAL LOW (ref 3.5–5.1)
Sodium: 140 mEq/L (ref 135–145)

## 2013-07-11 MED ORDER — SODIUM CHLORIDE 0.9 % IJ SOLN: 10.0000 mL | INTRAMUSCULAR | Status: DC | PRN

## 2013-07-11 MED ORDER — CLINIMIX E/DEXTROSE (5/15) 5 % IV SOLN
INTRAVENOUS | Status: AC
  Administered 2013-07-11: 18:00:00 via INTRAVENOUS
  Filled 2013-07-11: qty 2000

## 2013-07-11 MED ORDER — SODIUM CHLORIDE 0.9 % IJ SOLN
10.0000 mL | Freq: Two times a day (BID) | INTRAMUSCULAR | Status: DC
  Administered 2013-07-11 (×2): 10 mL
  Administered 2013-07-12: 20 mL
  Administered 2013-07-14: 10 mL
  Administered 2013-07-14: 20 mL

## 2013-07-11 MED ORDER — POTASSIUM CHLORIDE 10 MEQ/100ML IV SOLN
10.0000 meq | INTRAVENOUS | Status: AC
  Administered 2013-07-11 (×3): 10 meq via INTRAVENOUS
  Filled 2013-07-11 (×3): qty 100

## 2013-07-11 NOTE — Procedures (Signed)
Central Venous Catheter Insertion Procedure Note [name] [id number] July 15, 1941  Procedure: Insertion of Central Venous Catheter Indications: Drug and/or fluid administration, Frequent blood sampling and tpn.  Procedure Details Consent: Risks of procedure as well as the alternatives and risks of each were explained to the (patient/caregiver).  Consent for procedure obtained. Time Out: Verified patient identification, verified procedure, site/side was marked, verified correct patient position, special equipment/implants available, medications/allergies/relevent history reviewed, required imaging and test results available.  Performed  Maximum sterile technique was used including antiseptics, cap, gloves, gown, hand hygiene, mask and sheet. Skin prep: Chlorhexidine; local anesthetic administered A antimicrobial bonded/coated triple lumen catheter was placed in the right subclavian vein using the Seldinger technique.  Evaluation Blood flow good Complications: No apparent complications Patient did tolerate procedure well. Chest X-ray ordered to verify placement.  CXR: pending.  [name] A 07/11/2013, 9:52 AM

## 2013-07-11 NOTE — Progress Notes (Addendum)
PARENTERAL NUTRITION CONSULT NOTE  Pharmacy Consult for TPN Indication: NPO s/p gastric surgery  No Known Allergies  Patient Measurements: Height: 5\' 6"  (167.6 cm) Weight: 208 lb 12.4 oz (94.7 kg) IBW/kg (Calculated) : 59.3 Adjusted Body Weight: 75 kg Usual Weight: 95 kg  Vital Signs: Temp: 98.3 F (36.8 C) (10/05 0800) Temp src: Oral (10/05 0800) BP: 165/133 mmHg (10/05 0600) Pulse Rate: 74 (10/05 0600) Intake/Output from previous day: 10/04 0701 - 10/05 0700 In: 2356.3 [P.O.:100; I.V.:1537.5; Blood:368.8; NG/GT:200; IV Piggyback:150] Out: 2790 [Urine:2300; Emesis/NG output:400; Drains:90] Intake/Output from this shift: Total I/O In: 75 [I.V.:75] Out: -   Labs:  Recent Labs  07/09/13 0421 07/09/13 1625 07/09/13 2042 07/10/13 0517 07/11/13 0518  WBC 15.0*  --   --  12.6* 11.4*  HGB 7.8*  --  7.6* 6.9* 8.4*  HCT 23.9*  --  23.1* 21.3* 25.6*  PLT 452* 404*  --  405* 408*  APTT  --  39*  --   --   --   INR  --  1.34  --   --   --      Recent Labs  07/09/13 0421 07/10/13 0517 07/11/13 0518  NA 135 137 140  K 4.4 3.4* 3.3*  CL 98 99 101  CO2 28 30 29   GLUCOSE 112* 109* 86  BUN 14 11 9   CREATININE 0.83 0.71 0.64  CALCIUM 9.0 8.6 8.7  MG 2.7*  --   --   PHOS 3.9  --   --   PROT 5.8*  --   --   ALBUMIN 1.7*  --   --   AST 31  --   --   ALT 136*  --   --   ALKPHOS 89  --   --   BILITOT 0.5  --   --   PREALBUMIN 7.8*  --   --   TRIG 91  --   --    Estimated Creatinine Clearance: 72.7 ml/min (by C-G formula based on Cr of 0.64).    Recent Labs  07/10/13 2345 07/11/13 0325 07/11/13 0734  GLUCAP 83 71 83    Medical History: Past Medical History  Diagnosis Date  . Hypertension   . Paroxysmal SVT (supraventricular tachycardia)   . Hypercholesteremia   . GERD (gastroesophageal reflux disease)   . PUD (peptic ulcer disease)   . DVT (deep venous thrombosis) 2007  . Adrenal adenoma     left  . Thyroid nodule     left  . H. pylori infection    . Diverticulosis   . 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 Diarrhea     recurrent  . Arthritis   . Hiatal hernia   . Cancer     adenocarcinoma    Medications:  Scheduled:  . insulin aspart  0-15 Units Subcutaneous Q4H  . metoprolol  2.5 mg Intravenous Q6H  . pantoprazole (PROTONIX) IV  40 mg Intravenous QHS  . piperacillin-tazobactam (ZOSYN)  IV  3.375 g Intravenous Q8H  . potassium chloride  10 mEq Intravenous Q1 Hr x 3  . sodium chloride  10-40 mL Intracatheter Q12H    Insulin Requirements in the past 24 hours:  None  Current Nutrition:  NPO  Assessment: 72 yo F with new dx gastric CA.  She was admitted with gastric carcinoma perforation s/p repair.  She remains NPO.  Her electrolytes are normal. Her renal function is good.  She has no  history of diabetes, however blood glucose has been elevated this admission.  They are now <150 with addition of SSI.   PICC line removed last evening, central line to be placed Sunday (07/11/2013) and restart TPN at that time Central line placed 07/11/2013),  MD ordered K runs., labs reviewed  Nutritional Goals:  1500 kCal/day, 150 grams of protein per day  Plan:  1) Will initiate Clinimix E 5/15 at 50 ml/hr & titrate as tolerated to goal rate of 50ml/hr.  Will add MVI/minerals to TPN daily. 2) Intralipids on MWF 3) Continue CBG monitoring & moderate sliding scale insulin as needed.   4) Monitor CBC, CBGs, electrolytes, & TGs per protocol  Raquel James, Mabel Unrein Bennett 73/02/2013,10:40 AM

## 2013-07-11 NOTE — Progress Notes (Signed)
4 Days Post-Op  Subjective: No complaints of pain. Has had multiple bowel movements.  Objective: Vital signs in last 24 hours: Temp:  [98.1 F (36.7 C)-98.7 F (37.1 C)] 98.3 F (36.8 C) (10/05 0800) Pulse Rate:  [57-97] 74 (10/05 0600) Resp:  [21-26] 23 (10/05 0800) BP: (128-173)/(74-133) 165/133 mmHg (10/05 0600) SpO2:  [94 %-98 %] 95 % (10/05 0600) Weight:  [94.7 kg (208 lb 12.4 oz)] 94.7 kg (208 lb 12.4 oz) (10/05 0425) Last BM Date: 07/10/13  Intake/Output from previous day: 10/04 0701 - 10/05 0700 In: 2356.3 [P.O.:100; I.V.:1537.5; Blood:368.8; NG/GT:200; IV Piggyback:150] Out: 2790 [Urine:2300; Emesis/NG output:400; Drains:90] Intake/Output this shift: Total I/O In: 75 [I.V.:75] Out: -   General appearance: alert, cooperative and no distress Resp: clear to auscultation bilaterally Cardio: regular rate and rhythm, S1, S2 normal, no murmur, click, rub or gallop GI: Soft. Incision healing well. JP drainage decreasing and serous in nature. Active bowel sounds appreciated.  Lab Results:   Recent Labs  07/10/13 0517 07/11/13 0518  WBC 12.6* 11.4*  HGB 6.9* 8.4*  HCT 21.3* 25.6*  PLT 405* 408*   BMET  Recent Labs  07/10/13 0517 07/11/13 0518  NA 137 140  K 3.4* 3.3*  CL 99 101  CO2 30 29  GLUCOSE 109* 86  BUN 11 9  CREATININE 0.71 0.64  CALCIUM 8.6 8.7   PT/INR  Recent Labs  07/09/13 1625  LABPROT 16.3*  INR 1.34    Studies/Results: No results found.  Anti-infectives: Anti-infectives   Start     Dose/Rate Route Frequency Ordered Stop   07/08/13 0200  piperacillin-tazobactam (ZOSYN) IVPB 3.375 g     3.375 g 12.5 mL/hr over 240 Minutes Intravenous Every 8 hours 07/07/13 2327     07/07/13 1930  clindamycin (CLEOCIN) IVPB 900 mg     900 mg 100 mL/hr over 30 Minutes Intravenous  Once 07/07/13 1923 07/07/13 2018   07/07/13 1830  piperacillin-tazobactam (ZOSYN) IVPB 3.375 g     3.375 g 12.5 mL/hr over 240 Minutes Intravenous  Once 07/07/13 1818  07/07/13 1949      Assessment/Plan: s/p Procedure(s): EXPLORATORY LAPAROTOMY GASTRORRHAPHY Impression: Continues to progress well. PICC line removal successful in stopping bleeding. Patient's hemoglobin responded appropriately with one unit of packed red blood cells. Central line has been placed so that we can restart TPN. Will supplement potassium. Plan on upper GI series to assess gastric perforation repair tomorrow.  LOS: 4 days    [name] 07/11/2013

## 2013-07-12 ENCOUNTER — Inpatient Hospital Stay (HOSPITAL_COMMUNITY): Payer: Medicare Other

## 2013-07-12 ENCOUNTER — Ambulatory Visit (HOSPITAL_COMMUNITY): Payer: Medicare Other

## 2013-07-12 LAB — COMPREHENSIVE METABOLIC PANEL
ALT: 372 U/L — ABNORMAL HIGH (ref 0–35)
AST: 227 U/L — ABNORMAL HIGH (ref 0–37)
Alkaline Phosphatase: 96 U/L (ref 39–117)
CO2: 30 mEq/L (ref 19–32)
Calcium: 8.6 mg/dL (ref 8.4–10.5)
Chloride: 100 mEq/L (ref 96–112)
GFR calc Af Amer: >90 mL/min (ref >90)
GFR calc non Af Amer: 88 mL/min — ABNORMAL LOW (ref >90)
Sodium: 138 mEq/L (ref 135–145)

## 2013-07-12 LAB — CBC
HCT: 26.3 % — ABNORMAL LOW (ref 36.0–46.0)
Hemoglobin: 8.6 g/dL — ABNORMAL LOW (ref 12.0–15.0)
MCV: 77.1 fL — ABNORMAL LOW (ref 78.0–100.0)
Platelets: 490 10*3/uL — ABNORMAL HIGH (ref 150–400)
RDW: 16.2 % — ABNORMAL HIGH (ref 11.5–15.5)
WBC: 9.2 10*3/uL (ref 4.0–10.5)

## 2013-07-12 LAB — GLUCOSE, CAPILLARY
Glucose-Capillary: 163 mg/dL — ABNORMAL HIGH (ref 70–99)
Glucose-Capillary: 140 mg/dL — ABNORMAL HIGH (ref 70–99)
Glucose-Capillary: 109 mg/dL — ABNORMAL HIGH (ref 70–99)

## 2013-07-12 LAB — DIFFERENTIAL
Basophils Absolute: 0 10*3/uL (ref 0.0–0.1)
Eosinophils Relative: 1 % (ref 0–5)
Lymphocytes Relative: 11 % — ABNORMAL LOW (ref 12–46)
Lymphs Abs: 1 10*3/uL (ref 0.7–4.0)
Monocytes Absolute: 0.8 10*3/uL (ref 0.1–1.0)
Monocytes Relative: 8 % (ref 3–12)
Neutro Abs: 7.2 10*3/uL (ref 1.7–7.7)

## 2013-07-12 LAB — TRIGLYCERIDES: Triglycerides: 128 mg/dL (ref <150)

## 2013-07-12 MED ORDER — POTASSIUM CHLORIDE 10 MEQ/100ML IV SOLN
10.0000 meq | INTRAVENOUS | Status: AC
  Administered 2013-07-12 (×3): 10 meq via INTRAVENOUS
  Filled 2013-07-12 (×3): qty 100

## 2013-07-12 MED ORDER — INFLUENZA VAC SPLIT QUAD 0.5 ML IM SUSP
0.5000 mL | Freq: Once | INTRAMUSCULAR | Status: AC
  Administered 2013-07-12: 0.5 mL via INTRAMUSCULAR
  Filled 2013-07-12: qty 0.5

## 2013-07-12 MED ORDER — TRACE MINERALS CR-CU-F-FE-I-MN-MO-SE-ZN IV SOLN
INTRAVENOUS | Status: DC
  Administered 2013-07-12: 18:00:00 via INTRAVENOUS
  Filled 2013-07-12: qty 2000

## 2013-07-12 MED ORDER — HEPARIN SOD (PORK) LOCK FLUSH 100 UNIT/ML IV SOLN
500.0000 [IU] | Freq: Once | INTRAVENOUS | Status: AC
  Administered 2013-07-12: 500 [IU] via INTRAVENOUS
  Filled 2013-07-12: qty 5

## 2013-07-12 MED ORDER — IOHEXOL 350 MG/ML SOLN
100.0000 mL | Freq: Once | INTRAVENOUS | Status: AC | PRN
  Administered 2013-07-12: 100 mL

## 2013-07-12 MED ORDER — IOHEXOL 350 MG/ML SOLN
50.0000 mL | Freq: Once | INTRAVENOUS | Status: AC | PRN
  Administered 2013-07-12: 50 mL

## 2013-07-12 MED ORDER — FAT EMULSION 20 % IV EMUL
250.0000 mL | INTRAVENOUS | Status: DC
Start: 2013-07-12 — End: 2013-07-13
  Administered 2013-07-12: 250 mL via INTRAVENOUS
  Filled 2013-07-12: qty 250

## 2013-07-12 MED ORDER — PRO-STAT SUGAR FREE PO LIQD
30.0000 mL | Freq: Three times a day (TID) | ORAL | Status: DC
  Administered 2013-07-12 – 2013-07-15 (×6): 30 mL via ORAL
  Filled 2013-07-12 (×7): qty 30

## 2013-07-12 NOTE — Progress Notes (Signed)
UR chart review completed.  

## 2013-07-12 NOTE — Progress Notes (Signed)
Subjective: Patient is seen in bed.  Comfortable.  Complains of sore throat, likely secondary to nasogastric tube.  No indication of thrush on physical exam.    Plan is for Dr. Lovell Sheehan to perform dye study today to evaluate for patch stability.   Objective: Vital signs in last 24 hours: Temp:  [97.9 F (36.6 C)-99 F (37.2 C)] 98.3 F (36.8 C) (10/06 0738) Pulse Rate:  [67-99] 87 (10/06 0600) BP: (135-171)/(70-141) 152/97 mmHg (10/06 0600) SpO2:  [94 %-100 %] 98 % (10/06 0600) Weight:  [207 lb 7.3 oz (94.1 kg)] 207 lb 7.3 oz (94.1 kg) (10/06 0500)  Intake/Output from previous day: 10/05 0800 - 10/06 0759 In: 2030 [I.V.:1272.5; IV Piggyback:150; TPN:607.5] Out: 150 [Drains:150] Intake/Output this shift:    General appearance: alert, cooperative, mild distress and nasogastric tube in place.  Lab Results:   Recent Labs  07/11/13 0518 07/12/13 0440  WBC 11.4* 9.2  HGB 8.4* 8.6*  HCT 25.6* 26.3*  PLT 408* 490*   BMET  Recent Labs  07/11/13 0518 07/12/13 0440  NA 140 138  K 3.3* 3.3*  CL 101 100  CO2 29 30  GLUCOSE 86 152*  BUN 9 8  CREATININE 0.64 0.61  CALCIUM 8.7 8.6    Studies/Results: Dg Chest Port 1 View  07/11/2013   CLINICAL DATA:  Central line placement.  EXAM: PORTABLE CHEST - 1 VIEW  COMPARISON:  07/08/2013  FINDINGS: New right subclavian central line is been placed with the catheter tip in the upper SVC. No pneumothorax is identified. Positioning of the left subclavian Port-A-Cath is stable. Nasogastric tube continues to extend below the diaphragm. Lungs show increase in left basilar atelectasis with potential small left pleural effusion. The heart size and mediastinal contours are stable.  IMPRESSION: New right subclavian central catheter with the tip in the upper SVC. No pneumothorax is identified. There is increased in left basilar atelectasis with potential small left pleural effusion.   Electronically Signed   By: Irish Lack M.D.   On: 07/11/2013  10:56    Medications: I have reviewed the patient's current medications.  Assessment/Plan: 1. Gastric perforation, requiring surgical repair with patch on 07/07/2013 by Dr. Lovell Sheehan. 2. 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  Chemotherapy on hold due to #1.  3. Will continue to follow as an inpatient.   Patient and plan discussed with Dr. Alla German and he is in agreement with the aforementioned.      LOS: 5 days    Iris Hairston 07/12/2013

## 2013-07-12 NOTE — Progress Notes (Signed)
NUTRITION FOLLOW UP  Intervention:   Add ProStat 30 ml BID (each 30 ml provides 100 kcal, 15 gr protein) TPN per pharmacy team  Nutrition Dx:   Inadequate oral intake; progressing. Diet advanced to full liquids.  Goal:   Pt to meet >/= 90% of their estimated nutrition needs   Monitor:   Nutrition support and transition/ tolerance of oral intake diet advancement, labs and weight changes  Assessment:  Pt s/p exploratory laparotomy gastrorrhaphy (results pending). TPN @50  ml/hr. Diet advanced to full liquids. She had pudding, broth, tea and ice cream and tolerated well per pt.  Upper GI completed today with contrast. No leak identifed at surgical site.  Patient is receiving TPN with Clinimix E 5/15 @ 50 ml/hr.  Lipids (20% IVFE @ 10 ml/hr), multivitamins added. Provides 1332 kcal and 60 grams protein daily (based on weekly average).  Meets 95% minimum estimated kcal and 78% minimum estimated protein needs.  Additional IVF with lactated ringers @ 50 ml/hr.  Patient Active Problem List   Diagnosis Date Noted  . Burning with urination 07/05/2013  . Hypertension 06/30/2013  . Adenocarcinoma of stomach 06/16/2013  . Chest pain 06/16/2013  . Dyspepsia 11/04/2012  . OVERWEIGHT/OBESITY 07/30/2010  . TOBACCO USER 07/30/2010  . GASTRIC ULCER, HX OF 08/24/2009  . COLONIC POLYPS, HX OF 08/24/2009  . BENIGN NEOPLASM OF ADRENAL GLAND 05/03/2009  . CYST OF THYROID 05/03/2009  . DYSLIPIDEMIA 05/03/2009  . HYPERTENSION 05/03/2009  . PSVT 05/03/2009  . DVT 05/03/2009  . GERD 05/03/2009    Height: Ht Readings from Last 1 Encounters:  07/07/13 5\' 6"  (1.676 m)  IBW-130# (59 kg)  Weight Status:   Wt Readings from Last 1 Encounters:  07/12/13 207 lb 7.3 oz (94.1 kg)  admit wt 194# on 07/07/13  Re-estimated needs:  Kcal: 4098-1191 Protein: 77-89 gr Fluid: >2000 ml/day (normal needs)  Skin: abdominal incision (closed system drain), chest incision  Diet Order: Full  Liquid   Intake/Output Summary (Last 24 hours) at 07/12/13 1419 Last data filed at 07/12/13 1100  Gross per 24 hour  Intake   2375 ml  Output    650 ml  Net   1725 ml  Net since admission 07/07/13- +5.8 liters  Last BM: 07/11/13 loose, brown stool   Labs:   Recent Labs Lab 07/08/13 0431 07/09/13 0421 07/10/13 0517 07/11/13 0518 07/12/13 0440  NA 134* 135 137 140 138  K 5.0 4.4 3.4* 3.3* 3.3*  CL 98 98 99 101 100  CO2 28 28 30 29 30   BUN 17 14 11 9 8   CREATININE 0.85 0.83 0.71 0.64 0.61  CALCIUM 8.4 9.0 8.6 8.7 8.6  MG 2.4 2.7*  --   --  2.3  PHOS 4.0 3.9  --   --  2.1*  GLUCOSE 162* 112* 109* 86 152*    CBG (last 3)   Recent Labs  07/12/13 0405 07/12/13 0750 07/12/13 1108  GLUCAP 163* 143* 140*    Scheduled Meds: . insulin aspart  0-15 Units Subcutaneous Q4H  . metoprolol  2.5 mg Intravenous Q6H  . pantoprazole (PROTONIX) IV  40 mg Intravenous QHS  . piperacillin-tazobactam (ZOSYN)  IV  3.375 g Intravenous Q8H  . potassium chloride  10 mEq Intravenous Q1 Hr x 3  . sodium chloride  10-40 mL Intracatheter Q12H    Continuous Infusions: . lactated ringers 50 mL/hr at 07/12/13 0130  . Marland KitchenTPN (CLINIMIX-E) Adult 50 mL/hr at 07/11/13 1751   Royann Shivers MS,RD,LDN,CSG Office: #  [phone] Pager: [phone]

## 2013-07-12 NOTE — Progress Notes (Signed)
5 Days Post-Op  Subjective: No complaints except for sore throat from NG tube.  Objective: Vital signs in last 24 hours: Temp:  [97.9 F (36.6 C)-99 F (37.2 C)] 98.3 F (36.8 C) (10/06 0738) Pulse Rate:  [67-99] 77 (10/06 0800) BP: (135-171)/(70-141) 161/90 mmHg (10/06 0800) SpO2:  [94 %-100 %] 97 % (10/06 0800) Weight:  [94.1 kg (207 lb 7.3 oz)] 94.1 kg (207 lb 7.3 oz) (10/06 0500) Last BM Date: 07/11/13  Intake/Output from previous day: 10/05 0701 - 10/06 0700 In: 2030 [I.V.:1272.5; IV Piggyback:150; TPN:607.5] Out: 150 [Drains:150] Intake/Output this shift:    General appearance: alert, cooperative and no distress Resp: clear to auscultation bilaterally Cardio: regular rate and rhythm, S1, S2 normal, no murmur, click, rub or gallop GI: Soft. Incision healing well. JP drainage low with serous drainage present.  Lab Results:   Recent Labs  07/11/13 0518 07/12/13 0440  WBC 11.4* 9.2  HGB 8.4* 8.6*  HCT 25.6* 26.3*  PLT 408* 490*   BMET  Recent Labs  07/11/13 0518 07/12/13 0440  NA 140 138  K 3.3* 3.3*  CL 101 100  CO2 29 30  GLUCOSE 86 152*  BUN 9 8  CREATININE 0.64 0.61  CALCIUM 8.7 8.6   PT/INR  Recent Labs  07/09/13 1625  LABPROT 16.3*  INR 1.34    Studies/Results: Dg Chest Port 1 View  07/11/2013   CLINICAL DATA:  Central line placement.  EXAM: PORTABLE CHEST - 1 VIEW  COMPARISON:  07/08/2013  FINDINGS: New right subclavian central line is been placed with the catheter tip in the upper SVC. No pneumothorax is identified. Positioning of the left subclavian Port-A-Cath is stable. Nasogastric tube continues to extend below the diaphragm. Lungs show increase in left basilar atelectasis with potential small left pleural effusion. The heart size and mediastinal contours are stable.  IMPRESSION: New right subclavian central catheter with the tip in the upper SVC. No pneumothorax is identified. There is increased in left basilar atelectasis with potential  small left pleural effusion.   Electronically Signed   By: Irish Lack M.D.   On: 07/11/2013 10:56    Anti-infectives: Anti-infectives   Start     Dose/Rate Route Frequency Ordered Stop   07/08/13 0200  piperacillin-tazobactam (ZOSYN) IVPB 3.375 g     3.375 g 12.5 mL/hr over 240 Minutes Intravenous Every 8 hours 07/07/13 2327     07/07/13 1930  clindamycin (CLEOCIN) IVPB 900 mg     900 mg 100 mL/hr over 30 Minutes Intravenous  Once 07/07/13 1923 07/07/13 2018   07/07/13 1830  piperacillin-tazobactam (ZOSYN) IVPB 3.375 g     3.375 g 12.5 mL/hr over 240 Minutes Intravenous  Once 07/07/13 1818 07/07/13 1949      Assessment/Plan: s/p Procedure(s): EXPLORATORY LAPAROTOMY GASTRORRHAPHY Impression: Stable, on TPN. For upper GI series today. Further management pending those results. Patient be transferred to regular floor. Final pathology pending.  LOS: 5 days    [name] A 07/12/2013

## 2013-07-12 NOTE — Progress Notes (Signed)
PARENTERAL NUTRITION CONSULT NOTE  Pharmacy Consult for TPN Indication: NPO s/p gastric surgery  No Known Allergies  Patient Measurements: Height: 5\' 6"  (167.6 cm) Weight: 207 lb 7.3 oz (94.1 kg) IBW/kg (Calculated) : 59.3 Adjusted Body Weight: 75 kg Usual Weight: 95 kg  Vital Signs: Temp: 97.7 F (36.5 C) (10/06 1136) Temp src: Oral (10/06 1136) BP: 161/90 mmHg (10/06 0800) Pulse Rate: 77 (10/06 0800) Intake/Output from previous day: 10/05 0701 - 10/06 0700 In: 2130 [I.V.:1322.5; IV Piggyback:150; TPN:657.5] Out: 150 [Drains:150] Intake/Output from this shift: Total I/O In: 320 [I.V.:120; TPN:200] Out: 500 [Urine:500]  Labs:  Recent Labs  07/09/13 1625  07/10/13 0517 07/11/13 0518 07/12/13 0440  WBC  --   --  12.6* 11.4* 9.2  HGB  --   < > 6.9* 8.4* 8.6*  HCT  --   < > 21.3* 25.6* 26.3*  PLT 404*  --  405* 408* 490*  APTT 39*  --   --   --   --   INR 1.34  --   --   --   --   < > = values in this interval not displayed.   Recent Labs  07/10/13 0517 07/11/13 0518 07/12/13 0440  NA 137 140 138  K 3.4* 3.3* 3.3*  CL 99 101 100  CO2 30 29 30   GLUCOSE 109* 86 152*  BUN 11 9 8   CREATININE 0.71 0.64 0.61  CALCIUM 8.6 8.7 8.6  MG  --   --  2.3  PHOS  --   --  2.1*  PROT  --   --  5.7*  ALBUMIN  --   --  1.7*  AST  --   --  227*  ALT  --   --  372*  ALKPHOS  --   --  96  BILITOT  --   --  0.6  TRIG  --   --  128   Estimated Creatinine Clearance: 72.4 ml/min (by C-G formula based on Cr of 0.61).    Recent Labs  07/12/13 0405 07/12/13 0750 07/12/13 1108  GLUCAP 163* 143* 140*    Medical History: Past Medical History  Diagnosis Date  . Hypertension   . Paroxysmal SVT (supraventricular tachycardia)   . Hypercholesteremia   . GERD (gastroesophageal reflux disease)   . PUD (peptic ulcer disease)   . DVT (deep venous thrombosis) 2007  . Adrenal adenoma     left  . Thyroid nodule     left  . H. pylori infection   . Diverticulosis   .  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 Diarrhea     recurrent  . Arthritis   . Hiatal hernia   . Cancer     adenocarcinoma   Medications:  Scheduled:  . insulin aspart  0-15 Units Subcutaneous Q4H  . metoprolol  2.5 mg Intravenous Q6H  . pantoprazole (PROTONIX) IV  40 mg Intravenous QHS  . piperacillin-tazobactam (ZOSYN)  IV  3.375 g Intravenous Q8H  . potassium chloride  10 mEq Intravenous Q1 Hr x 3  . sodium chloride  10-40 mL Intracatheter Q12H   Insulin Requirements in the past 24 hours:  8 units  Current Nutrition:  NPO  Assessment: 72 yo F with new dx gastric CA.  She was admitted with gastric carcinoma perforation s/p repair.  She remains NPO.  Her electrolytes are normal except K+ a bit low. Her renal function is good.  She  has no history of diabetes, however blood glucose has been elevated this admission and has risen on TPN.  PICC line removed due to bleeding around site and central line Sunday (07/11/2013).  K+ being replenished with KCL runs. Albumin and Prealbumin are low.  AST and ALT are elevated.  Nutritional Goals:  1500 kCal/day, 150 grams of protein per day  Plan:  1) Clinimix E 5/15 at 50 ml/hr. Will add MVI and Trace Elements daily 2) Intralipids daily with Clinimix 3) Add regular insulin 20 units per bag ( bag). Continue CBG monitoring & moderate sliding scale insulin q4hrs  4) Monitor LFT's, CBG's, electrolytes, renal fxn & TGs per protocol  Valrie Hart A 07/12/2013,2:27 PM

## 2013-07-13 LAB — COMPREHENSIVE METABOLIC PANEL
ALT: 360 U/L — ABNORMAL HIGH (ref 0–35)
BUN: 9 mg/dL (ref 6–23)
CO2: 28 mEq/L (ref 19–32)
Calcium: 8.5 mg/dL (ref 8.4–10.5)
Creatinine, Ser: 0.62 mg/dL (ref 0.50–1.10)
GFR calc Af Amer: >90 mL/min (ref >90)
GFR calc non Af Amer: 87 mL/min — ABNORMAL LOW (ref >90)
Glucose, Bld: 153 mg/dL — ABNORMAL HIGH (ref 70–99)
Sodium: 139 mEq/L (ref 135–145)
Total Protein: 5.7 g/dL — ABNORMAL LOW (ref 6.0–8.3)

## 2013-07-13 LAB — GLUCOSE, CAPILLARY
Glucose-Capillary: 113 mg/dL — ABNORMAL HIGH (ref 70–99)
Glucose-Capillary: 117 mg/dL — ABNORMAL HIGH (ref 70–99)
Glucose-Capillary: 156 mg/dL — ABNORMAL HIGH (ref 70–99)
Glucose-Capillary: 89 mg/dL (ref 70–99)

## 2013-07-13 MED ORDER — PANTOPRAZOLE SODIUM 40 MG PO TBEC
40.0000 mg | DELAYED_RELEASE_TABLET | Freq: Two times a day (BID) | ORAL | Status: DC
  Administered 2013-07-13 – 2013-07-15 (×5): 40 mg via ORAL
  Filled 2013-07-13 (×5): qty 1

## 2013-07-13 MED ORDER — INSULIN ASPART 100 UNIT/ML ~~LOC~~ SOLN: 0.0000 [IU] | Freq: Three times a day (TID) | SUBCUTANEOUS | Status: DC

## 2013-07-13 MED ORDER — DILTIAZEM HCL ER COATED BEADS 180 MG PO CP24
360.0000 mg | ORAL_CAPSULE | Freq: Every day | ORAL | Status: DC
  Administered 2013-07-13 – 2013-07-15 (×3): 360 mg via ORAL
  Filled 2013-07-13 (×4): qty 2

## 2013-07-13 MED ORDER — INSULIN ASPART 100 UNIT/ML ~~LOC~~ SOLN: 0.0000 [IU] | Freq: Every day | SUBCUTANEOUS | Status: DC

## 2013-07-13 MED ORDER — HYDROCODONE-ACETAMINOPHEN 5-325 MG PO TABS
1.0000 | ORAL_TABLET | ORAL | Status: DC | PRN
  Administered 2013-07-13 – 2013-07-14 (×2): 1 via ORAL
  Filled 2013-07-13 (×2): qty 1

## 2013-07-13 MED ORDER — METOPROLOL SUCCINATE ER 50 MG PO TB24
50.0000 mg | ORAL_TABLET | Freq: Every day | ORAL | Status: DC
  Administered 2013-07-13 – 2013-07-15 (×3): 50 mg via ORAL
  Filled 2013-07-13 (×3): qty 1

## 2013-07-13 NOTE — Progress Notes (Signed)
Pt's central line leaking.  Dr. Lovell Sheehan notified via text page.

## 2013-07-13 NOTE — Progress Notes (Signed)
6 Days Post-Op  Subjective: Does feel tired, but no complaints of pain. Does have leaking around central line entrance point.  Objective: Vital signs in last 24 hours: Temp:  [97.7 F (36.5 C)-99.1 F (37.3 C)] 99.1 F (37.3 C) (10/06 2135) Pulse Rate:  [85-89] 85 (10/07 0005) Resp:  [19-20] 19 (10/07 0005) BP: (131-160)/(80-88) 132/80 mmHg (10/07 0005) SpO2:  [98 %-99 %] 98 % (10/06 2135) Last BM Date: 07/11/13  Intake/Output from previous day: 10/06 0701 - 10/07 0700 In: 1742 [P.O.:220; I.V.:570; IV Piggyback:400; TPN:552] Out: 1595 [Urine:1475; Drains:120] Intake/Output this shift:    General appearance: alert, cooperative and no distress Resp: clear to auscultation bilaterally Cardio: regular rate and rhythm, S1, S2 normal, no murmur, click, rub or gallop GI: Soft. Incision healing well. JP drainage serous in nature. Skin: Central line Central line inspected and redressed.  Well positioned.  Lab Results:   Recent Labs  07/11/13 0518 07/12/13 0440  WBC 11.4* 9.2  HGB 8.4* 8.6*  HCT 25.6* 26.3*  PLT 408* 490*   BMET  Recent Labs  07/12/13 0440 07/13/13 0520  NA 138 139  K 3.3* 3.6  CL 100 103  CO2 30 28  GLUCOSE 152* 153*  BUN 8 9  CREATININE 0.61 0.62  CALCIUM 8.6 8.5   PT/INR No results found for this basename: LABPROT, INR,  in the last 72 hours  Studies/Results: Dg Chest Port 1 View  07/11/2013   CLINICAL DATA:  Central line placement.  EXAM: PORTABLE CHEST - 1 VIEW  COMPARISON:  07/08/2013  FINDINGS: New right subclavian central line is been placed with the catheter tip in the upper SVC. No pneumothorax is identified. Positioning of the left subclavian Port-A-Cath is stable. Nasogastric tube continues to extend below the diaphragm. Lungs show increase in left basilar atelectasis with potential small left pleural effusion. The heart size and mediastinal contours are stable.  IMPRESSION: New right subclavian central catheter with the tip in the upper  SVC. No pneumothorax is identified. There is increased in left basilar atelectasis with potential small left pleural effusion.   Electronically Signed   By: Irish Lack M.D.   On: 07/11/2013 10:56   Dg Kayleen Memos W/water Sol Cm  07/12/2013   CLINICAL DATA:  Gastric cancer, large anterior wall gastric perforation through tumor, post omental patching on 07/07/2013 question leak  EXAM: ESOPHAGUS/BARIUM SWALLOW/TABLET STUDY  TECHNIQUE: After obtaining a scout radiograph a routine upper GI series was performed using water-soluble contrast material (150 mL of Omnipaque 350) through patient's indwelling nasogastric tube.  COMPARISON:  CT abdomen 07/07/2013  FLUOROSCOPY TIME:  3 minutes 6 seconds  FINDINGS: Jackson-Pratt drain identified anterior to the stomach/repair.  Imaging was performed in the supine, bilateral lateral positions, bilateral posterior oblique positions and mild anterior oblique positions bilaterally.  Irregularity of the gastric wall is seen at the proximal stomach consistent with tumor and surgery.  No gastric outlet obstruction.  No extravasation of contrast from the stomach is identified.  Specifically no contrast is seen anterior to the stomach on lateral/ anterior oblique views and the JP drain does not opacify with contrast.  Small amount of gastroesophageal reflux is seen around the nasogastric tube.  IMPRESSION: Wall irregularity at the proximal stomach consistent with tumor and surgery.  No contrast extravasation identified to suggest continued leak at the site of perforation/repair.  Findings discussed with Dr. Lovell Sheehan prior to dictation of this report.   Electronically Signed   By: Ulyses Southward M.D.   On:  07/12/2013 12:16    Anti-infectives: Anti-infectives   Start     Dose/Rate Route Frequency Ordered Stop   07/08/13 0200  piperacillin-tazobactam (ZOSYN) IVPB 3.375 g     3.375 g 12.5 mL/hr over 240 Minutes Intravenous Every 8 hours 07/07/13 2327     07/07/13 1930  clindamycin  (CLEOCIN) IVPB 900 mg     900 mg 100 mL/hr over 30 Minutes Intravenous  Once 07/07/13 1923 07/07/13 2018   07/07/13 1830  piperacillin-tazobactam (ZOSYN) IVPB 3.375 g     3.375 g 12.5 mL/hr over 240 Minutes Intravenous  Once 07/07/13 1818 07/07/13 1949      Assessment/Plan: s/p Procedure(s): EXPLORATORY LAPAROTOMY GASTRORRHAPHY Impression: Progressing well. Will wean TPN. 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 Oncology will address this with the patient. Advance to soft diet. Anticipate discharge in next 24-48 hours.  LOS: 6 days    Gram Siedlecki A 07/13/2013

## 2013-07-14 ENCOUNTER — Inpatient Hospital Stay (HOSPITAL_COMMUNITY): Payer: Medicare Other

## 2013-07-14 DIAGNOSIS — R110 Nausea: Secondary | ICD-10-CM

## 2013-07-14 LAB — COMPREHENSIVE METABOLIC PANEL
ALT: 492 U/L — ABNORMAL HIGH (ref 0–35)
AST: 270 U/L — ABNORMAL HIGH (ref 0–37)
CO2: 29 mEq/L (ref 19–32)
Calcium: 8.4 mg/dL (ref 8.4–10.5)
Chloride: 102 mEq/L (ref 96–112)
Creatinine, Ser: 0.71 mg/dL (ref 0.50–1.10)
GFR calc Af Amer: >90 mL/min (ref >90)
GFR calc non Af Amer: 84 mL/min — ABNORMAL LOW (ref >90)
Glucose, Bld: 94 mg/dL (ref 70–99)
Total Bilirubin: 0.5 mg/dL (ref 0.3–1.2)
Total Protein: 5.6 g/dL — ABNORMAL LOW (ref 6.0–8.3)

## 2013-07-14 LAB — GLUCOSE, CAPILLARY: Glucose-Capillary: 102 mg/dL — ABNORMAL HIGH (ref 70–99)

## 2013-07-14 MED ORDER — ONDANSETRON HCL 4 MG PO TABS: 8.0000 mg | ORAL_TABLET | Freq: Three times a day (TID) | ORAL | Status: DC | PRN

## 2013-07-14 MED ORDER — ONDANSETRON 8 MG/NS 50 ML IVPB
8.0000 mg | Freq: Three times a day (TID) | INTRAVENOUS | Status: DC | PRN
  Filled 2013-07-14: qty 8

## 2013-07-14 MED ORDER — OXYCODONE HCL 5 MG PO TABS: 10.0000 mg | ORAL_TABLET | ORAL | Status: DC | PRN

## 2013-07-14 MED ORDER — METOCLOPRAMIDE HCL 10 MG PO TABS
10.0000 mg | ORAL_TABLET | Freq: Three times a day (TID) | ORAL | Status: DC
  Administered 2013-07-14 – 2013-07-15 (×5): 10 mg via ORAL
  Filled 2013-07-14 (×5): qty 1

## 2013-07-14 MED ORDER — OXYCODONE HCL 5 MG PO TABS
10.0000 mg | ORAL_TABLET | ORAL | Status: DC | PRN
  Administered 2013-07-14 – 2013-07-15 (×2): 10 mg via ORAL
  Filled 2013-07-14 (×2): qty 2

## 2013-07-14 NOTE — Progress Notes (Signed)
Subjective: Patient ate collard greens and peas yesterday PM for dinner.   "I had a rough night."  She admits that she had increased nausea.  We will address antiemetic regimen.  She was well controlled with reglan as an outpatient.  She had gastroparesis as an outpatient secondary to tumor.  "I can't rest at all."  "I can't hold my pee."  We provided her education regarding kegel exercises to strengthen musculature now that foley catheter removed.   I personally reviewed and went over pathology results with the patient.  We reviewed pathology of biopsy that was positive for malignancy in the peritoneal cavity.  Therefore this affords her Stage IV disease.  We discussed that there is less likely of a role for surgery.  She was educated that her disease can be controlled with chemotherapy.  She was educated that we will not go for curative intent due to this upstaging.   Objective: Vital signs in last 24 hours: Temp:  [98.4 F (36.9 C)-98.9 F (37.2 C)] 98.4 F (36.9 C) (10/08 0450) Pulse Rate:  [84-91] 84 (10/08 0450) Resp:  [18-20] 20 (10/08 0450) BP: (115-145)/(53-82) 124/67 mmHg (10/08 0450) SpO2:  [94 %-98 %] 98 % (10/08 0450)  Intake/Output from previous day: 10/07 0800 - 10/08 0759 In: 960 [P.O.:960] Out: 1310 [Urine:1250; Drains:60] Intake/Output this shift: Total I/O In: 960 [P.O.:960] Out: 1310 [Urine:1250; Drains:60]  General appearance: alert, cooperative, appears stated age and no distress Oropharynx: no thrush.  no erythema  Lab Results:   Recent Labs  07/12/13 0440  WBC 9.2  HGB 8.6*  HCT 26.3*  PLT 490*   BMET  Recent Labs  07/13/13 0520 07/14/13 0445  NA 139 137  K 3.6 3.8  CL 103 102  CO2 28 29  GLUCOSE 153* 94  BUN 9 7  CREATININE 0.62 0.71  CALCIUM 8.5 8.4    Studies/Results: Dg Ugi W/water Sol Cm  07/12/2013   CLINICAL DATA:  Gastric cancer, large anterior wall gastric perforation through tumor, post omental patching on 07/07/2013  question leak  EXAM: ESOPHAGUS/BARIUM SWALLOW/TABLET STUDY  TECHNIQUE: After obtaining a scout radiograph a routine upper GI series was performed using water-soluble contrast material (150 mL of Omnipaque 350) through patient's indwelling nasogastric tube.  COMPARISON:  CT abdomen 07/07/2013  FLUOROSCOPY TIME:  3 minutes 6 seconds  FINDINGS: Jackson-Pratt drain identified anterior to the stomach/repair.  Imaging was performed in the supine, bilateral lateral positions, bilateral posterior oblique positions and mild anterior oblique positions bilaterally.  Irregularity of the gastric wall is seen at the proximal stomach consistent with tumor and surgery.  No gastric outlet obstruction.  No extravasation of contrast from the stomach is identified.  Specifically no contrast is seen anterior to the stomach on lateral/ anterior oblique views and the JP drain does not opacify with contrast.  Small amount of gastroesophageal reflux is seen around the nasogastric tube.  IMPRESSION: Wall irregularity at the proximal stomach consistent with tumor and surgery.  No contrast extravasation identified to suggest continued leak at the site of perforation/repair.  Findings discussed with Dr. Lovell Sheehan prior to dictation of this report.   Electronically Signed   By: Ulyses Southward M.D.   On: 07/12/2013 12:16    Medications: I have reviewed the patient's current medications.  Assessment/Plan: 1. Nausea, will address with anti-emetic regimen.  Will add Reglan scheduled every 6 hours.  Will Change Zofran to 8 mg every PRN nausea. 2. Stage IV Gastric Cancer, + biopsy for malignancy of  peritoneal implant.  T4 lesion with perforation. 3. S/P Gastric perforation, S/P surgical intervention on 07/07/2013. 4. Will continue to follow as an inpatient.  Will coordinate future chemotherapy with surgeon.   LOS: 7 days    Ra Pfiester 07/14/2013

## 2013-07-14 NOTE — Progress Notes (Signed)
7 Days Post-Op  Subjective: Feels gassy. Mild incisional pain. No emesis. Objective: Vital signs in last 24 hours: Temp:  [98.4 F (36.9 C)-98.9 F (37.2 C)] 98.4 F (36.9 C) (10/08 0450) Pulse Rate:  [84-91] 84 (10/08 0450) Resp:  [18-20] 20 (10/08 0450) BP: (115-145)/(53-82) 124/67 mmHg (10/08 0450) SpO2:  [94 %-98 %] 98 % (10/08 0450) Last BM Date: 07/11/13  Intake/Output from previous day: 10/07 0701 - 10/08 0700 In: 960 [P.O.:960] Out: 1310 [Urine:1250; Drains:60] Intake/Output this shift: Total I/O In: 240 [P.O.:240] Out: -   General appearance: alert, cooperative and fatigued Resp: clear to auscultation bilaterally Cardio: regular rate and rhythm, S1, S2 normal, no murmur, click, rub or gallop GI: Soft. Incision healing well. Minimal serous JP drainage.  Lab Results:   Recent Labs  07/12/13 0440  WBC 9.2  HGB 8.6*  HCT 26.3*  PLT 490*   BMET  Recent Labs  07/13/13 0520 07/14/13 0445  NA 139 137  K 3.6 3.8  CL 103 102  CO2 28 29  GLUCOSE 153* 94  BUN 9 7  CREATININE 0.62 0.71  CALCIUM 8.5 8.4   PT/INR No results found for this basename: LABPROT, INR,  in the last 72 hours  Studies/Results: No results found.  Anti-infectives: Anti-infectives   Start     Dose/Rate Route Frequency Ordered Stop   07/08/13 0200  piperacillin-tazobactam (ZOSYN) IVPB 3.375 g     3.375 g 12.5 mL/hr over 240 Minutes Intravenous Every 8 hours 07/07/13 2327     07/07/13 1930  clindamycin (CLEOCIN) IVPB 900 mg     900 mg 100 mL/hr over 30 Minutes Intravenous  Once 07/07/13 1923 07/07/13 2018   07/07/13 1830  piperacillin-tazobactam (ZOSYN) IVPB 3.375 g     3.375 g 12.5 mL/hr over 240 Minutes Intravenous  Once 07/07/13 1818 07/07/13 1949      Assessment/Plan: s/p Procedure(s): EXPLORATORY LAPAROTOMY GASTRORRHAPHY Impression: Continues to progress well. Appreciate oncology input. Discharge planning in progress. Plan on discharge tomorrow should patient continue  to tolerate soft diet well.  LOS: 7 days    Fayetta Sorenson A 07/14/2013

## 2013-07-15 MED ORDER — METOCLOPRAMIDE HCL 10 MG PO TABS: 10.0000 mg | ORAL_TABLET | Freq: Four times a day (QID) | ORAL | Status: AC

## 2013-07-15 MED ORDER — PANTOPRAZOLE SODIUM 40 MG PO TBEC
40.0000 mg | DELAYED_RELEASE_TABLET | Freq: Two times a day (BID) | ORAL | Status: AC
Start: 2013-07-15 — End: ?

## 2013-07-15 MED ORDER — OXYCODONE HCL 5 MG PO TABS: 5.0000 mg | ORAL_TABLET | ORAL | Status: DC | PRN

## 2013-07-15 NOTE — Progress Notes (Signed)
Patient received discharge instructions along with follow up appointments. Patient verbalized understanding of all instructions. Patient was escorted by staff via wheelchair to vehicle in stable condition.

## 2013-07-15 NOTE — Discharge Summary (Signed)
Physician Discharge Summary  Patient ID: [name] MRN: [id number] DOB/AGE: 1941-02-01 72 y.o.  Admit date: 07/07/2013 Discharge date: 07/15/2013  Admission Diagnoses: Peritonitis, pneumoperitoneum, gastric carcinoma, hypoalbuminemia, hypertension, anemia  Discharge Diagnoses:  Same, Gastric perforation of gastric cancer, metastatic gastric cancer Active Problems:   * No active hospital problems. *   Discharged Condition: good  Hospital Course: Patient is a 72 year old black female with a recent diagnosis of gastric carcinoma, status post Port-A-Cath insertion who presented to [facility] with worsening abdominal pain. CT scan the abdomen revealed perforated viscus. She was taken to the operating room emergently and underwent an exploratory laparotomy, gastrorrhaphy. She had a large gastric perforation secondary to her gastric cancer. She was also noted to have peritoneal implants, with final pathology revealing these to be metastatic deposits. Her postoperative course required intensive care unit monitoring. She surprisingly tolerated the surgery well. She did receive one unit of packed red blood cells 2 to acute on chronic anemia secondary to surgical blood loss and her gastric cancer. On postoperative day 5, she underwent an upper GI series which revealed the repair to be intact. Her diet was advanced. She and her family have been made aware of that she now has a stage IV gastric carcinoma. Oncology saw the patient while in the hospital and will treat her as an outpatient once she is recovered from surgery.  Consults: hematology/oncology  Treatments: surgery:  Exploratory laparotomy, gastrorrhaphy on 07/07/2013  Discharge Exam: Blood pressure 127/68, pulse 77, temperature 98.3 F (36.8 C), temperature source Oral, resp. rate 20, height 5\' 6"  (1.676 m), weight 94.1 kg (207 lb 7.3 oz), SpO2 99.00%. General appearance: alert, cooperative and no distress Resp: clear to  auscultation bilaterally Cardio: regular rate and rhythm, S1, S2 normal, no murmur, click, rub or gallop GI: Soft, nontender, nondistended. Incision healing well. JP drain removed.  Disposition: 01-Home or Self Care   Future Appointments Provider Department Dept Phone   07/19/2013 2:15 PM [facility] [phone]   07/19/2013 2:30 PM [name] [facility] [phone]   07/23/2013 12:00 PM Sharon Lint, MD [facility] [place] [phone]   07/26/2013 2:15 PM [facility] [phone]   08/02/2013 8:45 AM [facility] Team A [place] [facility] [phone]   08/02/2013 9:30 AM [facility] Covering Provider [facility] [phone]   08/09/2013 2:15 PM [facility] B [place] [facility] [phone]   08/16/2013 2:15 PM [facility] Team A [place] [facility] [phone]       Medication List         dicyclomine 20 MG tablet  Commonly known as:  BENTYL  Take 20 mg by mouth 2 (two) times daily at 8 am and 10 pm.     diltiazem 360 MG 24 hr capsule  Commonly known as:  TIAZAC  Take 360 mg by mouth every morning.     fish oil-omega-3 fatty acids 1000 MG capsule  Take 1 g by mouth daily.     LINZESS 145 MCG Caps capsule  Generic drug:  Linaclotide  Take 145 mcg by mouth daily.     lovastatin 40 MG tablet  Commonly known as:  MEVACOR  Take 2 tablets (80 mg total) by mouth at bedtime.     magnesium hydroxide 400 MG/5ML suspension  Commonly known as:  MILK OF MAGNESIA  Take 30 mLs by mouth daily as needed for constipation.  metoCLOPramide 10 MG tablet  Commonly known as:  REGLAN  Take 1 tablet (10 mg total) by mouth 4 (four) times daily.     metoprolol succinate 50 MG 24 hr tablet  Commonly known as:  TOPROL-XL  Take 50 mg by mouth every morning.     oxyCODONE 5 MG immediate release tablet  Commonly known as:  Oxy IR/ROXICODONE  Take 1 tablet (5 mg total) by  mouth every 4 (four) hours as needed for pain.     pantoprazole 40 MG tablet  Commonly known as:  PROTONIX  Take 40 mg by mouth 2 (two) times daily.     pantoprazole 40 MG tablet  Commonly known as:  PROTONIX  Take 1 tablet (40 mg total) by mouth 2 (two) times daily.     PROBIOTIC FORMULA PO  Take 1 tablet by mouth every morning.     prochlorperazine 10 MG tablet  Commonly known as:  COMPAZINE  Take 1 tablet (10 mg total) by mouth every 6 (six) hours.     STOOL SOFTENER PO  Take 1 capsule by mouth 2 (two) times daily.     sucralfate 1 G tablet  Commonly known as:  CARAFATE  Take 1 tablet (1 g total) by mouth 4 (four) times daily -  with meals and at bedtime.           Follow-up Information   Follow up with Dalia Heading, MD. Schedule an appointment as soon as possible for a visit on 07/22/2013.   Specialty:  General Surgery   Contact information:   [address] [place] [address] [phone]       Follow up with [facility]. Schedule an appointment as soon as possible for a visit in 2 weeks.   Contact information:   [address] [place] [address]       Signed: Dalia Heading 07/15/2013, 8:47 AM

## 2013-07-18 NOTE — Progress Notes (Signed)
-  No show, letter sent-   

## 2013-07-19 ENCOUNTER — Inpatient Hospital Stay (HOSPITAL_COMMUNITY): Payer: Medicare Other

## 2013-07-19 ENCOUNTER — Ambulatory Visit (HOSPITAL_COMMUNITY): Payer: Medicare Other | Admitting: Oncology

## 2013-07-21 ENCOUNTER — Inpatient Hospital Stay (HOSPITAL_COMMUNITY): Payer: Medicare Other

## 2013-07-23 ENCOUNTER — Telehealth (INDEPENDENT_AMBULATORY_CARE_PROVIDER_SITE_OTHER): Payer: Self-pay

## 2013-07-23 ENCOUNTER — Encounter (INDEPENDENT_AMBULATORY_CARE_PROVIDER_SITE_OTHER): Payer: Medicare Other | Admitting: General Surgery

## 2013-07-23 NOTE — Telephone Encounter (Signed)
LMOV pt does not need to come for a PAC post op since she will be receiving chemotherapy.

## 2013-07-26 ENCOUNTER — Inpatient Hospital Stay (HOSPITAL_COMMUNITY): Payer: Medicare Other

## 2013-07-26 NOTE — Progress Notes (Signed)
Sharon Courier, MD [address] [place] [address]  Adenocarcinoma of stomach - Plan: oxyCODONE (OXY IR/ROXICODONE) 5 MG immediate release tablet, DISCONTINUED: oxyCODONE (OXY IR/ROXICODONE) 5 MG immediate release tablet  Peripheral neuropathy - Plan: Hemoglobin A1c  CURRENT THERAPY: To start ECF chemotherapy on 08/02/2013  INTERVAL HISTORY: [name] 72 y.o. female returns for  regular  visit for followup of Stage IV (T4b, Nx, M1) adenocarcinoma of stomach diagnosed via biopsy by Dr. Darrick Penna on 06/11/2013.  She presented to [facility] on 07/07/2013 with gastric perforation; operated on and patched by Dr. Lovell Sheehan the same day on 07/07/2013.  During surgery, Dr. Lovell Sheehan noted malignant appearing peritoneal implants that were biopsied and positive for metastatic disease.  She is here today for follow-up after discharge from the Hospital.  She has undergone testing for gastric patency and patch did hold without any indication for leak.   Due to her recent complication, timing of chemotherapy has been coordinated by her General Surgeon, Dr. Lovell Sheehan, and he is agreeable to treatment as planned on 08/02/2013.  She is doing well since her surgery.  She reports that Dr. Lovell Sheehan has "released her."  We will move forward with chemotherapy as scheduled on Monday 10/27.  She reports that her pain is well controlled with OxyIR.  She is to take this medication every 4 hours as needed.  I provided her education regarding the "as needed" part of the Sig of the medication.  She may not require the medication every 4 hours but it is available to her every 4 hours.   Her surgical site is nice and clean without any open areas.  Steri-strips are in place and should be falling off soon.  We discussed her new stage of cancer although she seems to be in denial about the definition of Stage IV disease.  She wishes to give it up to "God's hands."  I encouraged continued positive attitude  and maintain her spirituality throughout this process.  Oncologically, she denies any complaints and ROS questioning is negative.    Past Medical History  Diagnosis Date  . Hypertension   . Paroxysmal SVT (supraventricular tachycardia)   . Hypercholesteremia   . GERD (gastroesophageal reflux disease)   . PUD (peptic ulcer disease)   . DVT (deep venous thrombosis) 2007  . Adrenal adenoma     left  . Thyroid nodule     left  . H. pylori infection   . Diverticulosis   . 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 Diarrhea     recurrent  . Arthritis   . Hiatal hernia   . Cancer     adenocarcinoma    has BENIGN NEOPLASM OF ADRENAL GLAND; CYST OF THYROID; DYSLIPIDEMIA; OVERWEIGHT/OBESITY; TOBACCO USER; HYPERTENSION; PSVT; GERD; GASTRIC ULCER, HX OF; COLONIC POLYPS, HX OF; Dyspepsia; Adenocarcinoma of stomach; Chest pain; and Hypertension on her problem list.     has No Known Allergies.  Ms. Schaff had no medications administered during this visit.  Past Surgical History  Procedure Laterality Date  . Total knee arthroplasty  bilateral knee  . Hip  replacement  left  . Appendectomy    . Cholecystectomy    . Abdominal hysterectomy    . Breast lumpectomy      left  . Ablation      of SVT  . Cardiac catheterization  2009    normal coronary arteries  . Colonoscopy  09/13/2009    [id number] internal hemorrhoids  . Esophagogastroduodenoscopy  09/13/2009    VFI:[id number]/  . Esophagogastroduodenoscopy N/A 06/11/2013    Procedure: ESOPHAGOGASTRODUODENOSCOPY (EGD);  Surgeon: West Bali, MD;  [facility];  Service: Endoscopy;  Laterality: N/A;  2:15-moved to 1045 [name] notified pt  . Portacath placement Left 07/06/2013    Procedure: INSERTION PORT-A-CATH;  Surgeon: Almond Lint, MD;  Location: [facility];  Service: General;  Laterality: Left;  . Laparotomy N/A 07/07/2013    Procedure: EXPLORATORY LAPAROTOMY;  Surgeon: Dalia Heading, MD;  Location: [facility];  Service: General;  Laterality: N/A;  . Gastrorrhaphy N/A 07/07/2013    Procedure: Ferrel Logan;  Surgeon: Dalia Heading, MD;  Location: [facility];  Service: General;  Laterality: N/A;    Denies any headaches, dizziness, double vision, fevers, chills, night sweats, nausea, vomiting, diarrhea, constipation, chest pain, heart palpitations, shortness of breath, blood in stool, black tarry stool, urinary pain, urinary burning, urinary frequency, hematuria.   PHYSICAL EXAMINATION  ECOG PERFORMANCE STATUS: 2 - Symptomatic, <50% confined to bed  Filed Vitals:   07/27/13 1241  BP: 125/76  Pulse: 67  Temp: 97.9 F (36.6 C)  Resp: 16    GENERAL:alert, well nourished, well developed, comfortable, cooperative, moderate distress and smiling SKIN: skin color, texture, turgor are normal, no rashes or significant lesions HEAD: Normocephalic, No masses, lesions, tenderness or abnormalities EYES: normal, PERRLA, EOMI, Conjunctiva are pink and non-injected EARS: External ears normal OROPHARYNX:mucous membranes are moist  NECK: supple, no adenopathy, thyroid normal size, non-tender, without nodularity, no stridor, non-tender, trachea midline LYMPH:  no palpable lymphadenopathy BREAST:not examined LUNGS: clear to auscultation and percussion HEART: regular rate & rhythm, no murmurs, no gallops, S1 normal and S2 normal ABDOMEN:abdomen soft, non-tender, obese, normal bowel sounds, surgical site is well healed with steri-strips in place and no hepatosplenomegaly BACK: Back symmetric, no curvature., No CVA tenderness EXTREMITIES:less then 2 second capillary refill, no joint deformities, effusion, or inflammation, no edema, no skin discoloration, no clubbing, no cyanosis  NEURO: alert & oriented x 3 with fluent speech, no focal motor/sensory deficits, gait normal     LABORATORY DATA: CBC    Component Value Date/Time   WBC 9.2 07/12/2013 0440   RBC 3.41* 07/12/2013 0440    HGB 8.6* 07/12/2013 0440   HCT 26.3* 07/12/2013 0440   PLT 490* 07/12/2013 0440   MCV 77.1* 07/12/2013 0440   MCH 25.2* 07/12/2013 0440   MCHC 32.7 07/12/2013 0440   RDW 16.2* 07/12/2013 0440   LYMPHSABS 1.0 07/12/2013 0440   MONOABS 0.8 07/12/2013 0440   EOSABS 0.1 07/12/2013 0440   BASOSABS 0.0 07/12/2013 0440      Chemistry      Component Value Date/Time   NA 137 07/14/2013 0445   K 3.8 07/14/2013 0445   CL 102 07/14/2013 0445   CO2 29 07/14/2013 0445   BUN 7 07/14/2013 0445   CREATININE 0.71 07/14/2013 0445      Component Value Date/Time   CALCIUM 8.4 07/14/2013 0445   ALKPHOS 99 07/14/2013 0445   AST 270* 07/14/2013 0445   ALT 492* 07/14/2013 0445   BILITOT 0.5 07/14/2013 0445        PATHOLOGY:  07/07/2013  Diagnosis Peritoneum, biopsy - METASTATIC ADENOCARCINOMA WITH SIGNET RING CELL FEATURES. Abigail Miyamoto MD Pathologist, Electronic Signature (Case signed 07/12/2013)    ASSESSMENT:  1. Stage IV (T4b, Nx, M1) adenocarcinoma of stomach diagnosed via biopsy by Dr. Darrick Penna on 06/11/2013.  She  presented to [facility] on 07/07/2013 with gastric perforation; operated on and patched by Dr. Lovell Sheehan the same day on 07/07/2013.  During surgery, Dr. Lovell Sheehan noted malignant appearing peritoneal implants that were biopsied and positive for metastatic disease. 2. Right handed peripheral neuropathy, affecting only her finger tips on right hand, all fingers.   Patient Active Problem List   Diagnosis Date Noted  . Hypertension 06/30/2013  . Adenocarcinoma of stomach 06/16/2013  . Chest pain 06/16/2013  . Dyspepsia 11/04/2012  . OVERWEIGHT/OBESITY 07/30/2010  . TOBACCO USER 07/30/2010  . GASTRIC ULCER, HX OF 08/24/2009  . COLONIC POLYPS, HX OF 08/24/2009  . BENIGN NEOPLASM OF ADRENAL GLAND 05/03/2009  . CYST OF THYROID 05/03/2009  . DYSLIPIDEMIA 05/03/2009  . HYPERTENSION 05/03/2009  . PSVT 05/03/2009  . GERD 05/03/2009      PLAN:  1. I personally reviewed and went over  laboratory results with the patient. 2. I personally reviewed and went over radiographic studies with the patient. 3. I personally reviewed and went over pathology results with the patient. 4. Pre-chemo Labs in 1 week: CBC diff, CMET, CEA, CA 19-9,  5. Hgb A1c with pre-chemo labs 6. Patient education regarding chemotherapy. 7. Return in 3 weeks for follow-up   THERAPY PLAN:  We will plan to embark on therapy as scheduled on 08/02/2013 consisting of ECF therapy.  We will anticipate 3 cycles of chemotherapy followed by restaging imaging pending tolerance and clinical course.  All questions were answered. The patient knows to call the clinic with any problems, questions or concerns. We can certainly see the patient much sooner if necessary.  Patient and plan discussed with Dr. Alla German and he is in agreement with the aforementioned.   More than 50% of the time spent with the patient was utilized for counseling and coordination of care.   KEFALAS,THOMAS

## 2013-07-27 ENCOUNTER — Encounter (HOSPITAL_COMMUNITY): Payer: Medicare Other | Attending: Hematology and Oncology | Admitting: Oncology

## 2013-07-27 ENCOUNTER — Encounter (HOSPITAL_COMMUNITY): Payer: Self-pay | Admitting: Oncology

## 2013-07-27 VITALS — BP 125/76 | HR 67 | Temp 97.9°F | Resp 16

## 2013-07-27 DIAGNOSIS — R300 Dysuria: Secondary | ICD-10-CM | POA: Insufficient documentation

## 2013-07-27 DIAGNOSIS — E663 Overweight: Secondary | ICD-10-CM | POA: Insufficient documentation

## 2013-07-27 DIAGNOSIS — K219 Gastro-esophageal reflux disease without esophagitis: Secondary | ICD-10-CM | POA: Insufficient documentation

## 2013-07-27 DIAGNOSIS — G629 Polyneuropathy, unspecified: Secondary | ICD-10-CM

## 2013-07-27 DIAGNOSIS — C169 Malignant neoplasm of stomach, unspecified: Secondary | ICD-10-CM | POA: Insufficient documentation

## 2013-07-27 DIAGNOSIS — G609 Hereditary and idiopathic neuropathy, unspecified: Secondary | ICD-10-CM | POA: Insufficient documentation

## 2013-07-27 DIAGNOSIS — I10 Essential (primary) hypertension: Secondary | ICD-10-CM | POA: Insufficient documentation

## 2013-07-27 MED ORDER — OXYCODONE HCL 5 MG PO TABS: 5.0000 mg | ORAL_TABLET | ORAL | Status: DC | PRN

## 2013-07-27 MED ORDER — OXYCODONE HCL 5 MG PO TABS: 5.0000 mg | ORAL_TABLET | ORAL | Status: AC | PRN

## 2013-07-27 NOTE — Patient Instructions (Signed)
[facility] Discharge Instructions  RECOMMENDATIONS MADE BY THE CONSULTANT AND ANY TEST RESULTS WILL BE SENT TO YOUR REFERRING PHYSICIAN.  Chemo as scheduled on Monday. A prescription was given to you for pain medication. Return to clinic in 3 weeks for MD appointment.  Thank you for choosing [facility] to provide your oncology and hematology care.  To afford each patient quality time with our providers, please arrive at least 15 minutes before your scheduled appointment time.  With your help, our goal is to use those 15 minutes to complete the necessary work-up to ensure our physicians have the information they need to help with your evaluation and healthcare recommendations.    Effective January 1st, 2014, we ask that you re-schedule your appointment with our physicians should you arrive 10 or more minutes late for your appointment.  We strive to give you quality time with our providers, and arriving late affects you and other patients whose appointments are after yours.    Again, thank you for choosing [facility].  Our hope is that these requests will decrease the amount of time that you wait before being seen by our physicians.       _____________________________________________________________  Should you have questions after your visit to [facility], please contact our office at [phone] between the hours of 8:30 a.m. and 5:00 p.m.  Voicemails left after 4:30 p.m. will not be returned until the following business day.  For prescription refill requests, have your pharmacy contact our office with your prescription refill request.

## 2013-07-27 NOTE — Progress Notes (Signed)
Agree with plan.  Tumor was HER-2/neu negative.

## 2013-07-28 ENCOUNTER — Inpatient Hospital Stay (HOSPITAL_COMMUNITY): Payer: Medicare Other

## 2013-07-30 ENCOUNTER — Encounter (HOSPITAL_COMMUNITY): Payer: Self-pay

## 2013-07-31 ENCOUNTER — Encounter (HOSPITAL_COMMUNITY): Payer: Self-pay | Admitting: Emergency Medicine

## 2013-07-31 ENCOUNTER — Emergency Department (HOSPITAL_COMMUNITY)
Admission: EM | Admit: 2013-07-31 | Discharge: 2013-07-31 | Disposition: A | Discharge status: Home / Self Care | Payer: Medicare Other | Attending: Emergency Medicine | Admitting: Emergency Medicine

## 2013-07-31 DIAGNOSIS — R5381 Other malaise: Secondary | ICD-10-CM | POA: Insufficient documentation

## 2013-07-31 DIAGNOSIS — Z79899 Other long term (current) drug therapy: Secondary | ICD-10-CM | POA: Insufficient documentation

## 2013-07-31 DIAGNOSIS — E7800 Pure hypercholesterolemia, unspecified: Secondary | ICD-10-CM | POA: Insufficient documentation

## 2013-07-31 DIAGNOSIS — Z86718 Personal history of other venous thrombosis and embolism: Secondary | ICD-10-CM | POA: Insufficient documentation

## 2013-07-31 DIAGNOSIS — R531 Weakness: Secondary | ICD-10-CM

## 2013-07-31 DIAGNOSIS — Z8619 Personal history of other infectious and parasitic diseases: Secondary | ICD-10-CM | POA: Insufficient documentation

## 2013-07-31 DIAGNOSIS — R110 Nausea: Secondary | ICD-10-CM

## 2013-07-31 DIAGNOSIS — K219 Gastro-esophageal reflux disease without esophagitis: Secondary | ICD-10-CM | POA: Insufficient documentation

## 2013-07-31 DIAGNOSIS — I10 Essential (primary) hypertension: Secondary | ICD-10-CM | POA: Insufficient documentation

## 2013-07-31 DIAGNOSIS — F17200 Nicotine dependence, unspecified, uncomplicated: Secondary | ICD-10-CM | POA: Insufficient documentation

## 2013-07-31 DIAGNOSIS — M129 Arthropathy, unspecified: Secondary | ICD-10-CM | POA: Insufficient documentation

## 2013-07-31 DIAGNOSIS — Z859 Personal history of malignant neoplasm, unspecified: Secondary | ICD-10-CM | POA: Insufficient documentation

## 2013-07-31 LAB — CBC WITH DIFFERENTIAL/PLATELET
Basophils Absolute: 0 10*3/uL (ref 0.0–0.1)
Eosinophils Absolute: 0.1 10*3/uL (ref 0.0–0.7)
Eosinophils Relative: 2 % (ref 0–5)
Hemoglobin: 9.1 g/dL — ABNORMAL LOW (ref 12.0–15.0)
Lymphs Abs: 1.2 10*3/uL (ref 0.7–4.0)
MCH: 23.8 pg — ABNORMAL LOW (ref 26.0–34.0)
MCV: 76.7 fL — ABNORMAL LOW (ref 78.0–100.0)
Monocytes Absolute: 0.7 10*3/uL (ref 0.1–1.0)
Monocytes Relative: 13 % — ABNORMAL HIGH (ref 3–12)
Platelets: 358 10*3/uL (ref 150–400)
RDW: 17.5 % — ABNORMAL HIGH (ref 11.5–15.5)
WBC: 5.5 10*3/uL (ref 4.0–10.5)

## 2013-07-31 LAB — URINALYSIS, ROUTINE W REFLEX MICROSCOPIC
Bilirubin Urine: NEGATIVE
Glucose, UA: NEGATIVE mg/dL
Nitrite: NEGATIVE
Specific Gravity, Urine: 1.02 (ref 1.005–1.030)
Urobilinogen, UA: 0.2 mg/dL (ref 0.0–1.0)

## 2013-07-31 LAB — URINE MICROSCOPIC-ADD ON

## 2013-07-31 LAB — CG4 I-STAT (LACTIC ACID): Lactic Acid, Venous: 1.54 mmol/L (ref 0.5–2.2)

## 2013-07-31 LAB — COMPREHENSIVE METABOLIC PANEL
AST: 31 U/L (ref 0–37)
Albumin: 2.8 g/dL — ABNORMAL LOW (ref 3.5–5.2)
Alkaline Phosphatase: 74 U/L (ref 39–117)
Chloride: 101 mEq/L (ref 96–112)
Glucose, Bld: 124 mg/dL — ABNORMAL HIGH (ref 70–99)
Potassium: 3.4 mEq/L — ABNORMAL LOW (ref 3.5–5.1)
Total Bilirubin: 0.3 mg/dL (ref 0.3–1.2)

## 2013-07-31 LAB — TROPONIN I: Troponin I: <0.3 ng/mL (ref <0.30)

## 2013-07-31 LAB — LIPASE, BLOOD: Lipase: 25 U/L (ref 11–59)

## 2013-07-31 MED ORDER — MORPHINE SULFATE 4 MG/ML IJ SOLN
4.0000 mg | Freq: Once | INTRAMUSCULAR | Status: AC
  Administered 2013-07-31: 4 mg via INTRAMUSCULAR
  Filled 2013-07-31: qty 1

## 2013-07-31 MED ORDER — ONDANSETRON 8 MG PO TBDP
8.0000 mg | ORAL_TABLET | Freq: Once | ORAL | Status: AC
  Administered 2013-07-31: 8 mg via ORAL
  Filled 2013-07-31: qty 1

## 2013-07-31 NOTE — ED Notes (Signed)
Pt states she had not slept, feels nausea, aching, feels weak and tired. Denies vomiting, family at the bedside.

## 2013-07-31 NOTE — ED Provider Notes (Signed)
CSN: [id number]     Arrival date & time 07/31/13  0355 History   First MD Initiated Contact with Patient 07/31/13 [phone]     Chief Complaint  Patient presents with  . Nausea    Patient is a 72 y.o. female presenting with weakness. The history is provided by the patient and a relative.  Weakness This is a new problem. The current episode started 3 to 5 hours ago. The problem occurs constantly. The problem has not changed since onset.Pertinent negatives include no chest pain, no abdominal pain, no headaches and no shortness of breath. Nothing aggravates the symptoms. Nothing relieves the symptoms. She has tried rest for the symptoms. The treatment provided no relief.  pt reports she has had difficulty sleeping and felt nausea tonight.  She also reports generalized weakness, but no focal weakness No cp/sob/abd pain/dysuria No falls No new meds She is scheduled to start chemo next week for gastric CA She reports left knee pain but reports this has been present for weeks and no trauma reported    Past Medical History  Diagnosis Date  . Hypertension   . Paroxysmal SVT (supraventricular tachycardia)   . Hypercholesteremia   . GERD (gastroesophageal reflux disease)   . PUD (peptic ulcer disease)   . DVT (deep venous thrombosis) 2007  . Adrenal adenoma     left  . Thyroid nodule     left  . H. pylori infection   . Diverticulosis   . 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 Diarrhea     recurrent  . Arthritis   . Hiatal hernia   . Cancer     adenocarcinoma   Past Surgical History  Procedure Laterality Date  . Total knee arthroplasty  bilateral knee  . Hip  replacement  left  . Appendectomy    . Cholecystectomy    . Abdominal hysterectomy    . Breast lumpectomy      left  . Ablation      of SVT  . Cardiac catheterization  2009    normal coronary arteries  . Colonoscopy  09/13/2009    JXB:[id number] internal hemorrhoids  .  Esophagogastroduodenoscopy  09/13/2009    NGE:[id number]/  . Esophagogastroduodenoscopy N/A 06/11/2013    Procedure: ESOPHAGOGASTRODUODENOSCOPY (EGD);  Surgeon: West Bali, MD;  [facility]: [facility];  Service: Endoscopy;  Laterality: N/A;  2:15-moved to 1045 [name] notified pt  . Portacath placement Left 07/06/2013    Procedure: INSERTION PORT-A-CATH;  Surgeon: Almond Lint, MD;  Location: [facility];  Service: General;  Laterality: Left;  . Laparotomy N/A 07/07/2013    Procedure: EXPLORATORY LAPAROTOMY;  Surgeon: Dalia Heading, MD;  Location: [facility];  Service: General;  Laterality: N/A;  . Gastrorrhaphy N/A 07/07/2013    Procedure: Ferrel Logan;  Surgeon: Dalia Heading, MD;  Location: [facility];  Service: General;  Laterality: N/A;   Family History  Problem Relation Age of Onset  . Colon cancer Neg Hx   . Cancer Mother     leukemia  . Cancer Sister     breast  . Heart disease Brother   . Heart disease Sister    History  Substance Use Topics  . Smoking status: Current Every Day Smoker -- 15 years  . Smokeless tobacco: Never Used     Comment: Quit smoking x 3 weeks  . Alcohol Use: No   OB History   Grav Para Term Preterm Abortions TAB SAB Ect Mult  Living                 Review of Systems  Constitutional: Positive for fatigue. Negative for fever.  Respiratory: Negative for shortness of breath.   Cardiovascular: Negative for chest pain.  Gastrointestinal: Negative for vomiting, abdominal pain and diarrhea.  Genitourinary: Negative for dysuria.  Musculoskeletal: Negative for back pain.  Neurological: Positive for weakness. Negative for syncope and headaches.  All other systems reviewed and are negative.    Allergies  Review of patient's allergies indicates no known allergies.  Home Medications   Current Outpatient Rx  Name  Route  Sig  Dispense  Refill  . dexamethasone (DECADRON) 4 MG tablet   Oral   Take 4 mg by mouth.         . diltiazem (TIAZAC) 360 MG 24 hr  capsule   Oral   Take 360 mg by mouth every morning.          [name] Calcium (STOOL SOFTENER PO)   Oral   Take 1 capsule by mouth 2 (two) times daily.         Marland Kitchen lidocaine-prilocaine (EMLA) cream   Topical   Apply 1 application topically as needed.         . lovastatin (MEVACOR) 40 MG tablet   Oral   Take 2 tablets (80 mg total) by mouth at bedtime.   60 tablet   11   . metoCLOPramide (REGLAN) 10 MG tablet   Oral   Take 1 tablet (10 mg total) by mouth 4 (four) times daily.   100 tablet   3   . metoprolol (TOPROL-XL) 50 MG 24 hr tablet   Oral   Take 50 mg by mouth every morning.          Marland Kitchen oxyCODONE (OXY IR/ROXICODONE) 5 MG immediate release tablet   Oral   Take 1 tablet (5 mg total) by mouth every 4 (four) hours as needed for pain.   60 tablet   0   . pantoprazole (PROTONIX) 40 MG tablet   Oral   Take 40 mg by mouth 2 (two) times daily.           . pantoprazole (PROTONIX) 40 MG tablet   Oral   Take 1 tablet (40 mg total) by mouth 2 (two) times daily.   60 tablet   2   . prochlorperazine (COMPAZINE) 10 MG tablet   Oral   Take 1 tablet (10 mg total) by mouth every 6 (six) hours.   100 tablet   3   . temazepam (RESTORIL) 7.5 MG capsule   Oral   Take 7.5 mg by mouth at bedtime as needed for sleep.         Marland Kitchen dicyclomine (BENTYL) 20 MG tablet   Oral   Take 20 mg by mouth 2 (two) times daily at 8 am and 10 pm.         . fish oil-omega-3 fatty acids 1000 MG capsule   Oral   Take 1 g by mouth daily.         . Linaclotide (LINZESS) 145 MCG CAPS capsule   Oral   Take 145 mcg by mouth daily.         . magnesium hydroxide (MILK OF MAGNESIA) 400 MG/5ML suspension   Oral   Take 30 mLs by mouth daily as needed for constipation.         . Probiotic Product (PROBIOTIC FORMULA PO)   Oral  Take 1 tablet by mouth every morning.         . sucralfate (CARAFATE) 1 G tablet   Oral   Take 1 tablet (1 g total) by mouth 4 (four) times daily -   with meals and at bedtime.   60 tablet   2    BP 143/63  Pulse 76  Temp(Src) 98.9 F (37.2 C) (Oral)  Ht 5\' 6"  (1.676 m)  Wt 194 lb (87.998 kg)  BMI 31.33 kg/m2  SpO2 95% Physical Exam CONSTITUTIONAL: Well developed/well nourished HEAD: Normocephalic/atraumatic EYES: EOMI/PERRL ENMT: Mucous membranes moist NECK: supple no meningeal signs SPINE:entire spine nontender CV: S1/S2 noted, no murmurs/rubs/gallops noted LUNGS: Lungs are clear to auscultation bilaterally, no apparent distress ABDOMEN: soft, nontender, no rebound or guarding. Well healed incision noted.  No localized tenderness GU:no cva tenderness NEURO: Pt is awake/alert, moves all extremitiesx4. There is no arm drift noted.  Left LE movement limited due to pain.  No facial droop and no slurred speech noted EXTREMITIES: pulses normal, full ROM. She has well healed scars to both knees from TKA. No erythema/edema/warmth to either knee.   SKIN: warm, color normal PSYCH: no abnormalities of mood noted  ED Course  Procedures  Labs Review Labs Reviewed  COMPREHENSIVE METABOLIC PANEL - Abnormal; Notable for the following:    Potassium 3.4 (*)    Glucose, Bld 124 (*)    Albumin 2.8 (*)    ALT 73 (*)    GFR calc non Af Amer 83 (*)    All other components within normal limits  CBC WITH DIFFERENTIAL - Abnormal; Notable for the following:    RBC 3.82 (*)    Hemoglobin 9.1 (*)    HCT 29.3 (*)    MCV 76.7 (*)    MCH 23.8 (*)    RDW 17.5 (*)    Monocytes Relative 13 (*)    All other components within normal limits  URINALYSIS, ROUTINE W REFLEX MICROSCOPIC - Abnormal; Notable for the following:    Leukocytes, UA SMALL (*)    All other components within normal limits  URINE MICROSCOPIC-ADD ON - Abnormal; Notable for the following:    Squamous Epithelial / LPF FEW (*)    Bacteria, UA FEW (*)    All other components within normal limits  URINE CULTURE  LIPASE, BLOOD  TROPONIN I  CG4 I-STAT (LACTIC ACID)   Imaging  Review No results found.  EKG Interpretation     Ventricular Rate:  71 PR Interval:  152 QRS Duration: 74 QT Interval:  382 QTC Calculation: 415 R Axis:   59 Text Interpretation:  Sinus rhythm with Premature atrial complexes Non-specific ST-t changes nml axis When compared with ECG of 07-Jul-2013 13:00, Premature atrial complexes are now Present artifact noted            Pt stable in the ED She was ambulatory After pain meds given for her knee, she felt improved She has no focal weakness.  I doubt ACS at this time, and no signs of infectious process at this time She feels comfortable for d/c home and will f/u with chemo appointment next week She reports decreased sleep due to anxiety with impending chemo and thinks this may be causing her fatigue  MDM   1. Weakness   2. Nausea    Nursing notes including past medical history and social history reviewed and considered in documentation Labs/vital reviewed and considered Previous records reviewed and considered - previous ED visits reviewed  Joya Gaskins, MD 07/31/13 [phone]

## 2013-08-02 ENCOUNTER — Encounter (HOSPITAL_COMMUNITY): Payer: Self-pay

## 2013-08-02 ENCOUNTER — Encounter (HOSPITAL_BASED_OUTPATIENT_CLINIC_OR_DEPARTMENT_OTHER): Payer: Medicare Other

## 2013-08-02 VITALS — BP 128/66 | HR 16

## 2013-08-02 VITALS — BP 131/81 | HR 72 | Temp 97.0°F | Resp 16 | Wt 197.6 lb

## 2013-08-02 DIAGNOSIS — E663 Overweight: Secondary | ICD-10-CM

## 2013-08-02 DIAGNOSIS — D500 Iron deficiency anemia secondary to blood loss (chronic): Secondary | ICD-10-CM

## 2013-08-02 DIAGNOSIS — C786 Secondary malignant neoplasm of retroperitoneum and peritoneum: Secondary | ICD-10-CM

## 2013-08-02 DIAGNOSIS — R300 Dysuria: Secondary | ICD-10-CM

## 2013-08-02 DIAGNOSIS — K219 Gastro-esophageal reflux disease without esophagitis: Secondary | ICD-10-CM

## 2013-08-02 DIAGNOSIS — K251 Acute gastric ulcer with perforation: Secondary | ICD-10-CM

## 2013-08-02 DIAGNOSIS — M79609 Pain in unspecified limb: Secondary | ICD-10-CM

## 2013-08-02 DIAGNOSIS — C169 Malignant neoplasm of stomach, unspecified: Secondary | ICD-10-CM

## 2013-08-02 DIAGNOSIS — Z5111 Encounter for antineoplastic chemotherapy: Secondary | ICD-10-CM

## 2013-08-02 DIAGNOSIS — I10 Essential (primary) hypertension: Secondary | ICD-10-CM

## 2013-08-02 LAB — COMPREHENSIVE METABOLIC PANEL
AST: 32 U/L (ref 0–37)
Albumin: 2.8 g/dL — ABNORMAL LOW (ref 3.5–5.2)
Alkaline Phosphatase: 75 U/L (ref 39–117)
Calcium: 9.2 mg/dL (ref 8.4–10.5)
Creatinine, Ser: 0.71 mg/dL (ref 0.50–1.10)
GFR calc non Af Amer: 84 mL/min — ABNORMAL LOW (ref >90)
Glucose, Bld: 117 mg/dL — ABNORMAL HIGH (ref 70–99)
Sodium: 140 mEq/L (ref 135–145)

## 2013-08-02 LAB — CBC WITH DIFFERENTIAL/PLATELET
Basophils Absolute: 0 10*3/uL (ref 0.0–0.1)
Basophils Relative: 0 % (ref 0–1)
Eosinophils Relative: 2 % (ref 0–5)
HCT: 28.4 % — ABNORMAL LOW (ref 36.0–46.0)
MCH: 23.8 pg — ABNORMAL LOW (ref 26.0–34.0)
MCHC: 31 g/dL (ref 30.0–36.0)
MCV: 77 fL — ABNORMAL LOW (ref 78.0–100.0)
Monocytes Absolute: 0.6 10*3/uL (ref 0.1–1.0)
Neutrophils Relative %: 74 % (ref 43–77)
RDW: 17.8 % — ABNORMAL HIGH (ref 11.5–15.5)
WBC: 6.7 10*3/uL (ref 4.0–10.5)

## 2013-08-02 LAB — CANCER ANTIGEN 19-9: CA 19-9: 15.2 U/mL — ABNORMAL LOW (ref <35.0)

## 2013-08-02 LAB — URINE CULTURE: Colony Count: 5000

## 2013-08-02 LAB — CEA: CEA: 1 ng/mL (ref 0.0–5.0)

## 2013-08-02 MED ORDER — SODIUM CHLORIDE 0.9 % IJ SOLN
10.0000 mL | INTRAMUSCULAR | Status: DC | PRN
  Administered 2013-08-02: 10 mL

## 2013-08-02 MED ORDER — SODIUM CHLORIDE 0.9 % IV SOLN
Freq: Once | INTRAVENOUS | Status: AC
  Administered 2013-08-02: 09:00:00 via INTRAVENOUS

## 2013-08-02 MED ORDER — EPIRUBICIN HCL CHEMO IV INJECTION 200 MG/100ML
50.0000 mg/m2 | Freq: Once | INTRAVENOUS | Status: AC
  Administered 2013-08-02: 102 mg via INTRAVENOUS
  Filled 2013-08-02: qty 51

## 2013-08-02 MED ORDER — SODIUM CHLORIDE 0.9 % IV SOLN
200.0000 mg/m2/d | INTRAVENOUS | Status: DC
  Administered 2013-08-02: 2850 mg via INTRAVENOUS
  Filled 2013-08-02: qty 57

## 2013-08-02 MED ORDER — PALONOSETRON HCL INJECTION 0.25 MG/5ML
INTRAVENOUS | Status: AC
  Filled 2013-08-02: qty 5

## 2013-08-02 MED ORDER — PALONOSETRON HCL INJECTION 0.25 MG/5ML
0.2500 mg | Freq: Once | INTRAVENOUS | Status: AC
  Administered 2013-08-02: 0.25 mg via INTRAVENOUS

## 2013-08-02 MED ORDER — SODIUM CHLORIDE 0.9 % IV SOLN
Freq: Once | INTRAVENOUS | Status: AC
  Administered 2013-08-02: 09:00:00 via INTRAVENOUS
  Filled 2013-08-02: qty 5

## 2013-08-02 MED ORDER — SODIUM CHLORIDE 0.9 % IV SOLN: 150.0000 mg | Freq: Once | INTRAVENOUS | Status: DC

## 2013-08-02 MED ORDER — POTASSIUM CHLORIDE 2 MEQ/ML IV SOLN
Freq: Once | INTRAVENOUS | Status: AC
  Administered 2013-08-02: 10:00:00 via INTRAVENOUS
  Filled 2013-08-02: qty 10

## 2013-08-02 MED ORDER — SODIUM CHLORIDE 0.9 % IV SOLN
60.0000 mg/m2 | Freq: Once | INTRAVENOUS | Status: AC
  Administered 2013-08-02: 122 mg via INTRAVENOUS
  Filled 2013-08-02: qty 122

## 2013-08-02 MED ORDER — DEXAMETHASONE SODIUM PHOSPHATE 20 MG/5ML IJ SOLN: 12.0000 mg | Freq: Once | INTRAMUSCULAR | Status: DC

## 2013-08-02 MED ORDER — HEPARIN SOD (PORK) LOCK FLUSH 100 UNIT/ML IV SOLN
500.0000 [IU] | Freq: Once | INTRAVENOUS | Status: AC | PRN
  Administered 2013-08-02: 500 [IU]

## 2013-08-02 NOTE — Progress Notes (Signed)
Tolerated chemo well today.  No problems with infusions.  Home with 51fu 2800 mg by continuous infusion pump over 168 hrs. Accompanied by son and friend. All IV  connections checked and tightened.

## 2013-08-02 NOTE — Patient Instructions (Signed)
[facility] Discharge Instructions  RECOMMENDATIONS MADE BY THE CONSULTANT AND ANY TEST RESULTS WILL BE SENT TO YOUR REFERRING PHYSICIAN.  EXAM FINDINGS BY THE PHYSICIAN TODAY AND SIGNS OR SYMPTOMS TO REPORT TO CLINIC OR PRIMARY PHYSICIAN: Exam and findings as discussed by Dr. Zigmund Daniel.  Will continue with chemotherapy.  Report fevers, chills, uncontrolled nausea, vomiting, diarrhea or other problems.  MEDICATIONS PRESCRIBED:  Decrease your oxycodone to 5mg  (1 tablet) every 4 hours as needed for pain Can take tylenol 650mg  every 6 hours as needed for knee pain.  INSTRUCTIONS/FOLLOW-UP: Follow-up in 1 week.  Thank you for choosing [facility] to provide your oncology and hematology care.  To afford each patient quality time with our providers, please arrive at least 15 minutes before your scheduled appointment time.  With your help, our goal is to use those 15 minutes to complete the necessary work-up to ensure our physicians have the information they need to help with your evaluation and healthcare recommendations.    Effective January 1st, 2014, we ask that you re-schedule your appointment with our physicians should you arrive 10 or more minutes late for your appointment.  We strive to give you quality time with our providers, and arriving late affects you and other patients whose appointments are after yours.    Again, thank you for choosing [facility].  Our hope is that these requests will decrease the amount of time that you wait before being seen by our physicians.       _____________________________________________________________  Should you have questions after your visit to [facility], please contact our office at [phone] between the hours of 8:30 a.m. and 5:00 p.m.  Voicemails left after 4:30 p.m. will not be returned until the following business day.  For prescription refill requests, have your pharmacy contact our  office with your prescription refill request.

## 2013-08-02 NOTE — Progress Notes (Signed)
[facility]  OFFICE PROGRESS NOTE  Evlyn Courier, MD [address] [place] [address] [place] [address]  DIAGNOSIS: Adenocarcinoma of stomach - Plan: CBC with Differential, Comprehensive metabolic panel, Cancer Antigen 19-9  Malignancy of stomach with perforation (07/07/2013)  Chief Complaint  Patient presents with  . Gastric Cancer    CURRENT THERAPY: On 07/07/2013 underwent expiratory laparotomy and patching of a gastric perforation. To begin ECF chemotherapy today  INTERVAL HISTORY: [name] 72 y.o. female returns for initiation of chemotherapy for stage IV adenocarcinoma of the stomach, HER-2/neu negative.  She is eating well with no episodes of nausea or vomiting. Primary complaint is left knee pain. She is taking 10 mg of oxycodone at a time and becomes a little confused as a result. She denies any abdominal pain, diarrhea, constipation, melena, hematochezia, hematuria, cough, wheezing, PND, orthopnea, palpitations, or peripheral paresthesias.  MEDICAL HISTORY: Past Medical History  Diagnosis Date  . Hypertension   . Paroxysmal SVT (supraventricular tachycardia)   . Hypercholesteremia   . GERD (gastroesophageal reflux disease)   . PUD (peptic ulcer disease)   . DVT (deep venous thrombosis) 2007  . Adrenal adenoma     left  . Thyroid nodule     left  . H. pylori infection   . Diverticulosis   . 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 Diarrhea     recurrent  . Arthritis   . Hiatal hernia   . Cancer     adenocarcinoma    INTERIM HISTORY: has BENIGN NEOPLASM OF ADRENAL GLAND; CYST OF THYROID; DYSLIPIDEMIA; OVERWEIGHT/OBESITY; TOBACCO USER; HYPERTENSION; PSVT; GERD; GASTRIC ULCER, HX OF; COLONIC POLYPS, HX OF; Dyspepsia; Adenocarcinoma of stomach; Chest pain; and Hypertension on her problem list.   Stage IV (T4b, Nx, M1) adenocarcinoma of stomach diagnosed via biopsy by Dr. Darrick Penna on  06/11/2013. She presented to [facility] on 07/07/2013 with gastric perforation; operated on and patched by Dr. Lovell Sheehan the same day on 07/07/2013. During surgery, Dr. Lovell Sheehan noted malignant appearing peritoneal implants that were biopsied and positive for metastatic disease. She has undergone testing for gastric patency and patch did hold without any indication for leak. Due to her recent complication, timing of chemotherapy has been coordinated by her General Surgeon, Dr. Lovell Sheehan, and he is agreeable to treatment as planned on 08/02/2013      ALLERGIES:  has No Known Allergies.  MEDICATIONS: has a current medication list which includes the following prescription(s): dicyclomine, diltiazem, lidocaine-prilocaine, linaclotide, lovastatin, metoclopramide, metoprolol succinate, oxycodone, probiotic product, temazepam, dexamethasone, docusate calcium, fish oil-omega-3 fatty acids, magnesium hydroxide, pantoprazole, and prochlorperazine, and the following Facility-Administered Medications: CISplatin (PLATINOL) 122 mg in sodium chloride 0.9 % 500 mL chemo infusion, epirubicin (ELLENCE) 102 mg in sodium chloride 0.9 % 500 mL chemo infusion, fluorouracil (ADRUCIL) 2,850 mg in sodium chloride 0.9 % 150 mL chemo infusion, heparin lock flush, and sodium chloride.  SURGICAL HISTORY:  Past Surgical History  Procedure Laterality Date  . Total knee arthroplasty  bilateral knee  . Hip  replacement  left  . Appendectomy    . Cholecystectomy    . Abdominal hysterectomy    . Breast lumpectomy      left  . Ablation      of SVT  . Cardiac catheterization  2009    normal coronary arteries  . Colonoscopy  09/13/2009    FAO:ZHYQMVHQION/GEXBMWUX internal hemorrhoids  .  Esophagogastroduodenoscopy  09/13/2009    ZOX:WRUEAV/  . Esophagogastroduodenoscopy N/A 06/11/2013    Procedure: ESOPHAGOGASTRODUODENOSCOPY (EGD);  Surgeon: West Bali, MD;  Location: [facility] ENDO SUITE;  Service: Endoscopy;  Laterality: N/A;   2:15-moved to 1045 [name] notified pt  . Portacath placement Left 07/06/2013    Procedure: INSERTION PORT-A-CATH;  Surgeon: Almond Lint, MD;  Location: [facility] ORS;  Service: General;  Laterality: Left;  . Laparotomy N/A 07/07/2013    Procedure: EXPLORATORY LAPAROTOMY;  Surgeon: Dalia Heading, MD;  Location: [facility] ORS;  Service: General;  Laterality: N/A;  . Gastrorrhaphy N/A 07/07/2013    Procedure: Ferrel Logan;  Surgeon: Dalia Heading, MD;  Location: [facility] ORS;  Service: General;  Laterality: N/A;    FAMILY HISTORY: family history includes Cancer in her mother and sister; Heart disease in her brother and sister. There is no history of Colon cancer.  SOCIAL HISTORY:  reports that she has been smoking.  She has never used smokeless tobacco. She reports that she does not drink alcohol or use illicit drugs.  REVIEW OF SYSTEMS:  Other than that discussed above is noncontributory.  PHYSICAL EXAMINATION: ECOG PERFORMANCE STATUS: 2 - Symptomatic, <50% confined to bed  Blood pressure 131/81, pulse 72, temperature 97 F (36.1 C), resp. rate 16, weight 197 lb 9.6 oz (89.631 kg).  GENERAL:alert, no distress and comfortable SKIN: skin color, texture, turgor are normal, no rashes or significant lesions EYES: PERLA; Conjunctiva are pink and non-injected, sclera clear OROPHARYNX:no exudate, no erythema on lips, buccal mucosa, or tongue. NECK: supple, thyroid normal size, non-tender, without nodularity. No masses CHEST: Normal AP diameter with light port in place. LYMPH:  no palpable lymphadenopathy in the cervical, axillary or inguinal LUNGS: clear to auscultation and percussion with normal breathing effort HEART: regular rate & rhythm and no murmurs and no lower extremity edema ABDOMEN:abdomen soft, non-tender and normal bowel sounds. Midline surgical scars healed well. No distention, free fluid wave, or rebound tenderness. MUSCULOSKELETAL:no cyanosis of digits and no clubbing. Range of motion normal.    NEURO: alert & oriented x 3 with fluent speech, no focal motor/sensory deficits   LABORATORY DATA: Infusion on 08/02/2013  Component Date Value Range Status  . Magnesium 08/02/2013 2.1  1.5 - 2.5 mg/dL Final  . WBC 40/98/1191 6.7  4.0 - 10.5 K/uL Final  . RBC 08/02/2013 3.69* 3.87 - 5.11 MIL/uL Final  . Hemoglobin 08/02/2013 8.8* 12.0 - 15.0 g/dL Final  . HCT 47/82/9562 28.4* 36.0 - 46.0 % Final  . MCV 08/02/2013 77.0* 78.0 - 100.0 fL Final  . MCH 08/02/2013 23.8* 26.0 - 34.0 pg Final  . MCHC 08/02/2013 31.0  30.0 - 36.0 g/dL Final  . RDW 13/05/6577 17.8* 11.5 - 15.5 % Final  . Platelets 08/02/2013 372  150 - 400 K/uL Final  . Neutrophils Relative % 08/02/2013 74  43 - 77 % Final  . Neutro Abs 08/02/2013 4.9  1.7 - 7.7 K/uL Final  . Lymphocytes Relative 08/02/2013 16  12 - 46 % Final  . Lymphs Abs 08/02/2013 1.0  0.7 - 4.0 K/uL Final  . Monocytes Relative 08/02/2013 9  3 - 12 % Final  . Monocytes Absolute 08/02/2013 0.6  0.1 - 1.0 K/uL Final  . Eosinophils Relative 08/02/2013 2  0 - 5 % Final  . Eosinophils Absolute 08/02/2013 0.1  0.0 - 0.7 K/uL Final  . Basophils Relative 08/02/2013 0  0 - 1 % Final  . Basophils Absolute 08/02/2013 0.0  0.0 - 0.1 K/uL  Final  . Sodium 08/02/2013 140  135 - 145 mEq/L Final  . Potassium 08/02/2013 3.8  3.5 - 5.1 mEq/L Final  . Chloride 08/02/2013 103  96 - 112 mEq/L Final  . CO2 08/02/2013 31  19 - 32 mEq/L Final  . Glucose, Bld 08/02/2013 117* 70 - 99 mg/dL Final  . BUN 40/98/1191 8  6 - 23 mg/dL Final  . Creatinine, Ser 08/02/2013 0.71  0.50 - 1.10 mg/dL Final  . Calcium 47/82/9562 9.2  8.4 - 10.5 mg/dL Final  . Total Protein 08/02/2013 6.7  6.0 - 8.3 g/dL Final  . Albumin 13/05/6577 2.8* 3.5 - 5.2 g/dL Final  . AST 46/96/2952 32  0 - 37 U/L Final  . ALT 08/02/2013 54* 0 - 35 U/L Final  . Alkaline Phosphatase 08/02/2013 75  39 - 117 U/L Final  . Total Bilirubin 08/02/2013 0.3  0.3 - 1.2 mg/dL Final  . GFR calc non Af Amer 08/02/2013 84* >90  mL/min Final  . GFR calc Af Amer 08/02/2013 >90  >90 mL/min Final   Comment: (NOTE)                          The eGFR has been calculated using the CKD EPI equation.                          This calculation has not been validated in all clinical situations.                          eGFR's persistently <90 mL/min signify possible Chronic Kidney                          Disease.  Admission on 07/31/2013, Discharged on 07/31/2013  Component Date Value Range Status  . Sodium 07/31/2013 139  135 - 145 mEq/L Final  . Potassium 07/31/2013 3.4* 3.5 - 5.1 mEq/L Final  . Chloride 07/31/2013 101  96 - 112 mEq/L Final  . CO2 07/31/2013 30  19 - 32 mEq/L Final  . Glucose, Bld 07/31/2013 124* 70 - 99 mg/dL Final  . BUN 84/13/2440 9  6 - 23 mg/dL Final  . Creatinine, Ser 07/31/2013 0.72  0.50 - 1.10 mg/dL Final  . Calcium 08/03/2535 9.2  8.4 - 10.5 mg/dL Final  . Total Protein 07/31/2013 6.6  6.0 - 8.3 g/dL Final  . Albumin 64/40/3474 2.8* 3.5 - 5.2 g/dL Final  . AST 25/95/6387 31  0 - 37 U/L Final  . ALT 07/31/2013 73* 0 - 35 U/L Final  . Alkaline Phosphatase 07/31/2013 74  39 - 117 U/L Final  . Total Bilirubin 07/31/2013 0.3  0.3 - 1.2 mg/dL Final  . GFR calc non Af Amer 07/31/2013 83* >90 mL/min Final  . GFR calc Af Amer 07/31/2013 >90  >90 mL/min Final   Comment: (NOTE)                          The eGFR has been calculated using the CKD EPI equation.                          This calculation has not been validated in all clinical situations.  eGFR's persistently <90 mL/min signify possible Chronic Kidney                          Disease.  . WBC 07/31/2013 5.5  4.0 - 10.5 K/uL Final  . RBC 07/31/2013 3.82* 3.87 - 5.11 MIL/uL Final  . Hemoglobin 07/31/2013 9.1* 12.0 - 15.0 g/dL Final  . HCT 04/54/0981 29.3* 36.0 - 46.0 % Final  . MCV 07/31/2013 76.7* 78.0 - 100.0 fL Final  . MCH 07/31/2013 23.8* 26.0 - 34.0 pg Final  . MCHC 07/31/2013 31.1  30.0 - 36.0 g/dL Final  .  RDW 19/14/7829 17.5* 11.5 - 15.5 % Final  . Platelets 07/31/2013 358  150 - 400 K/uL Final  . Neutrophils Relative % 07/31/2013 63  43 - 77 % Final  . Neutro Abs 07/31/2013 3.5  1.7 - 7.7 K/uL Final  . Lymphocytes Relative 07/31/2013 22  12 - 46 % Final  . Lymphs Abs 07/31/2013 1.2  0.7 - 4.0 K/uL Final  . Monocytes Relative 07/31/2013 13* 3 - 12 % Final  . Monocytes Absolute 07/31/2013 0.7  0.1 - 1.0 K/uL Final  . Eosinophils Relative 07/31/2013 2  0 - 5 % Final  . Eosinophils Absolute 07/31/2013 0.1  0.0 - 0.7 K/uL Final  . Basophils Relative 07/31/2013 1  0 - 1 % Final  . Basophils Absolute 07/31/2013 0.0  0.0 - 0.1 K/uL Final  . Lipase 07/31/2013 25  11 - 59 U/L Final  . Lactic Acid, Venous 07/31/2013 1.54  0.5 - 2.2 mmol/L Final  . Troponin I 07/31/2013 <0.30  <0.30 ng/mL Final   Comment:                                 Due to the release kinetics of cTnI,                          a negative result within the first hours                          of the onset of symptoms does not rule out                          myocardial infarction with certainty.                          If myocardial infarction is still suspected,                          repeat the test at appropriate intervals.  . Color, Urine 07/31/2013 YELLOW  YELLOW Final  . APPearance 07/31/2013 CLEAR  CLEAR Final  . Specific Gravity, Urine 07/31/2013 1.020  1.005 - 1.030 Final  . pH 07/31/2013 6.0  5.0 - 8.0 Final  . Glucose, UA 07/31/2013 NEGATIVE  NEGATIVE mg/dL Final  . Hgb urine dipstick 07/31/2013 NEGATIVE  NEGATIVE Final  . Bilirubin Urine 07/31/2013 NEGATIVE  NEGATIVE Final  . Ketones, ur 07/31/2013 NEGATIVE  NEGATIVE mg/dL Final  . Protein, ur 56/21/3086 NEGATIVE  NEGATIVE mg/dL Final  . Urobilinogen, UA 07/31/2013 0.2  0.0 - 1.0 mg/dL Final  . Nitrite 57/84/6962 NEGATIVE  NEGATIVE Final  . Leukocytes, UA 07/31/2013 SMALL* NEGATIVE Final  . Squamous  Epithelial / LPF 07/31/2013 FEW* RARE Final  . WBC, UA  07/31/2013 11-20  <3 WBC/hpf Final  . RBC / HPF 07/31/2013 0-2  <3 RBC/hpf Final  . Bacteria, UA 07/31/2013 FEW* RARE Final  Admission on 07/07/2013, Discharged on 07/15/2013  No results displayed because visit has over 200 results.    Admission on 07/06/2013, Discharged on 07/06/2013  Component Date Value Range Status  . Color, Urine 07/06/2013 YELLOW  YELLOW Final  . APPearance 07/06/2013 CLEAR  CLEAR Final  . Specific Gravity, Urine 07/06/2013 1.019  1.005 - 1.030 Final  . pH 07/06/2013 7.0  5.0 - 8.0 Final  . Glucose, UA 07/06/2013 NEGATIVE  NEGATIVE mg/dL Final  . Hgb urine dipstick 07/06/2013 NEGATIVE  NEGATIVE Final  . Bilirubin Urine 07/06/2013 NEGATIVE  NEGATIVE Final  . Ketones, ur 07/06/2013 NEGATIVE  NEGATIVE mg/dL Final  . Protein, ur 16/07/9603 NEGATIVE  NEGATIVE mg/dL Final  . Urobilinogen, UA 07/06/2013 1.0  0.0 - 1.0 mg/dL Final  . Nitrite 54/06/8118 NEGATIVE  NEGATIVE Final  . Leukocytes, UA 07/06/2013 TRACE* NEGATIVE Final  . Squamous Epithelial / LPF 07/06/2013 RARE  RARE Final  . WBC, UA 07/06/2013 0-2  <3 WBC/hpf Final  . RBC / HPF 07/06/2013 0-2  <3 RBC/hpf Final  . Bacteria, UA 07/06/2013 RARE  RARE Final  Infusion on 07/05/2013  Component Date Value Range Status  . Color, Urine 07/05/2013 YELLOW  YELLOW Final  . APPearance 07/05/2013 CLEAR  CLEAR Final  . Specific Gravity, Urine 07/05/2013 1.015  1.005 - 1.030 Final  . pH 07/05/2013 7.0  5.0 - 8.0 Final  . Glucose, UA 07/05/2013 NEGATIVE  NEGATIVE mg/dL Final  . Hgb urine dipstick 07/05/2013 NEGATIVE  NEGATIVE Final  . Bilirubin Urine 07/05/2013 NEGATIVE  NEGATIVE Final  . Ketones, ur 07/05/2013 NEGATIVE  NEGATIVE mg/dL Final  . Protein, ur 14/78/2956 NEGATIVE  NEGATIVE mg/dL Final  . Urobilinogen, UA 07/05/2013 0.2  0.0 - 1.0 mg/dL Final  . Nitrite 21/30/8657 NEGATIVE  NEGATIVE Final  . Leukocytes, UA 07/05/2013 NEGATIVE  NEGATIVE Final   MICROSCOPIC NOT DONE ON URINES WITH NEGATIVE PROTEIN, BLOOD,  LEUKOCYTES, NITRITE, OR GLUCOSE <1000 mg/dL.    PATHOLOGY:  Urinalysis    Component Value Date/Time   COLORURINE YELLOW 07/31/2013 0552   APPEARANCEUR CLEAR 07/31/2013 0552   LABSPEC 1.020 07/31/2013 0552   PHURINE 6.0 07/31/2013 0552   GLUCOSEU NEGATIVE 07/31/2013 0552   HGBUR NEGATIVE 07/31/2013 0552   BILIRUBINUR NEGATIVE 07/31/2013 0552   KETONESUR NEGATIVE 07/31/2013 0552   PROTEINUR NEGATIVE 07/31/2013 0552   UROBILINOGEN 0.2 07/31/2013 0552   NITRITE NEGATIVE 07/31/2013 0552   LEUKOCYTESUR SMALL* 07/31/2013 0552    RADIOGRAPHIC STUDIES: Nm Cardiac Muga Rest  07/05/2013   CLINICAL DATA:  Adenocarcinoma of the stomach, pre toxic chemotherapy with epirubicin  EXAM: NUCLEAR MEDICINE CARDIAC BLOOD POOL IMAGING (MUGA)  TECHNIQUE: Cardiac multi-gated acquisition was performed at rest following intravenous injection of Tc-72m labeled red blood cells.  COMPARISON:  None  RADIOPHARMACEUTICALS:  CURIE ULTRATAG TECHNETIUM TC 99M-LABELED RED BLOOD CELLS IV KITmCiTc-94m in-vitro labeled red blood cells.  FINDINGS: Left ventricular ejection fraction is calculated at 81%, slightly above the normal range.  This was obtained at a heart rate of 80 beats per min.  Wall motion analysis of the left ventricle in 3 projections is normal.  IMPRESSION: Minimally elevated left ventricular ejection fraction of 81%.  Normal LV wall motion.   Electronically Signed   By: Ulyses Southward M.D.   On:  07/05/2013 15:57   Ct Abdomen Pelvis W Contrast  07/07/2013   CLINICAL DATA:  Abdominal pain and melena for 2 days, hematochezia, history ulcer disease, appendectomy, adrenal adenoma, diverticulosis, smoking, gastric cancer  EXAM: CT ABDOMEN AND PELVIS WITH CONTRAST  TECHNIQUE: Multidetector CT imaging of the abdomen and pelvis was performed using the standard protocol following bolus administration of intravenous contrast. Sagittal and coronal MPR images reconstructed from axial data set.  CONTRAST:  50mL OMNIPAQUE  IOHEXOL 300 MG/ML SOLN, OMNIPAQUE IOHEXOL 300 MG/ML SOLN  COMPARISON:  06/04/2013, PET-CT 06/24/2013  FINDINGS: Dependent bibasilar atelectasis.  Liver, spleen, pancreas, right kidney and right adrenal gland normal appearance.  Small cyst left kidney 18 x 14 mm image 28.  Stable bilateral adrenal nodules, larger on left, with washout characteristics consistent with adrenal adenoma.  Free intraperitoneal air in fluid identified in the upper abdomen with free fluid in the pelvis consistent with perforated viscus.  Diffuse gastric wall thickening consistent with known gastric carcinoma.  Focal gas is identified at the anterior margin of the stomach adjacent to the left lobe of the liver, suspect anterior gastric perforation.  Segment of questionable wall thickening of the mid descending colon versus artifact.  Remaining bowel loops normal appearance.  Beam hardening artifacts from left hip prosthesis obscure portions of pelvis, with visualized bladder unremarkable.  No additional mass or adenopathy.  Scattered degenerative disc disease changes thoracolumbar spine greatest at L3-L4.  IMPRESSION: Free intraperitoneal air and fluid compatible with perforated viscus, suspect from anterior wall of the proximal to mid stomach.  Questionable segment of colonic wall thickening at the mid descending colon versus underdistention artifact, cannot exclude segment of colitis.  Stable adrenal nodules and left renal cyst.  Critical Value/emergent results were called by telephone at the time of interpretation on 07/07/2013 at 5:35 PM to Dr.JOHN [facility] , who verbally acknowledged these results.   Electronically Signed   By: Ulyses Southward M.D.   On: 07/07/2013 17:36   Ir US Guide Vasc Access Right  07/09/2013   *RADIOLOGY REPORT*  Indication: In need of venous access for TPN administration  ULTRASOUND AND FLUORSCOPIC GUIDED PICC LINE INSERTION  Intravenous Medications: None  Contrast: None  Fluoroscopy Time:  18 seconds   Complications: None immediate  Technique / Findings:  The procedure, risks, benefits, and alternatives were explained to the patient and informed written consent was obtained.  A timeout was performed prior to the initiation of the procedure.  The right upper extremity was prepped with chlorhexidine in a sterile fashion, and a sterile drape was applied covering the operative field.  Maximum barrier sterile technique with sterile gowns and gloves were used for the procedure.  A timeout was performed prior to the initiation of the procedure.  Local anesthesia was provided with 1% lidocaine.  Under direct ultrasound guidance, the rightbasilicvein was accessed with a micropuncture kit after the overlying soft tissues were anesthetized with 1% lidocaine.  An ultrasound image was saved for documentation purposes.  A guidewire was advanced to the level of the superior caval-atrial junction for measurement purposes and the PICC line was cut to length.  A peel-away sheath was placed and a 41 cm, 5 [place], dual lumen was inserted to level of the superior caval-atrial junction.  A post procedure spot fluoroscopic was obtained.  The catheter easily aspirated and flushed and was sutured in place.  A dressing was placed.  The patient tolerated the procedure well without immediate post procedural complication.  Impression:  Successful  ultrasound and fluoroscopic guided placement of a right basilic vein approach, 41 cm, 5 French,dual lumen PICC with tip at the superior caval-atrial junction.  The PICC line is ready for immediate use.   Original Report Authenticated By: Tacey Ruiz, MD   Dg Chest Port 1 View  07/11/2013   CLINICAL DATA:  Central line placement.  EXAM: PORTABLE CHEST - 1 VIEW  COMPARISON:  07/08/2013  FINDINGS: New right subclavian central line is been placed with the catheter tip in the upper SVC. No pneumothorax is identified. Positioning of the left subclavian Port-A-Cath is stable. Nasogastric tube continues  to extend below the diaphragm. Lungs show increase in left basilar atelectasis with potential small left pleural effusion. The heart size and mediastinal contours are stable.  IMPRESSION: New right subclavian central catheter with the tip in the upper SVC. No pneumothorax is identified. There is increased in left basilar atelectasis with potential small left pleural effusion.   Electronically Signed   By: Irish Lack M.D.   On: 07/11/2013 10:56   Dg Chest Port 1 View  07/08/2013   CLINICAL DATA:  Left side PICC line placement  EXAM: PORTABLE CHEST - 1 VIEW  COMPARISON:  Portable exam 1202 hr compared to 07/06/2013  FINDINGS: Left subclavian Port-A-Cath with tip projecting over SVC.  Left arm PICC line is coiled in the subclavian vein with the tip directed antegrade at the left brachiocephalic vein left of midline.  Enlargement of cardiac silhouette.  Minimal pulmonary vascular congestion.  Lungs grossly clear.  No pleural effusion or pneumothorax.  IMPRESSION: Left arm PICC line is coiled in the left subclavian vein; recommend repositioning or replacement.  Findings discussed with Victorino Dike RN from the PICC team on 07/08/2013 at 1221 hr.   Electronically Signed   By: Ulyses Southward M.D.   On: 07/08/2013 12:22   Dg Chest Port 1 View  07/06/2013   *RADIOLOGY REPORT*  Clinical Data: Evaluate port catheter placement  PORTABLE CHEST - 1 VIEW  Comparison: 06/16/2013; PET CT - 06/24/2013  Findings:  Grossly unchanged enlarged cardiac silhouette and mediastinal contours given persistent reduced lung volumes.  Interval placement of a left anterior chest wall subclavian vein approach port-a- catheter with tip projected over the superior SVC.  No pneumothorax.  There is persistent left basilar heterogeneous opacities favored to represent atelectasis.  Trace left-sided effusion is not excluded.  IMPRESSION: 1.  Interval placement of left anterior chest wall port-a-catheter with tip projected over the superior SVC.  No  pneumothorax. 2.  Persistently reduced lung volumes with left basilar opacities, favored to represent atelectasis.   Original Report Authenticated By: Tacey Ruiz, MD   Dg Kayleen Memos W/water Sol Cm  07/12/2013   CLINICAL DATA:  Gastric cancer, large anterior wall gastric perforation through tumor, post omental patching on 07/07/2013 question leak  EXAM: ESOPHAGUS/BARIUM SWALLOW/TABLET STUDY  TECHNIQUE: After obtaining a scout radiograph a routine upper GI series was performed using water-soluble contrast material (150 mL of Omnipaque 350) through patient's indwelling nasogastric tube.  COMPARISON:  CT abdomen 07/07/2013  FLUOROSCOPY TIME:  3 minutes 6 seconds  FINDINGS: Jackson-Pratt drain identified anterior to the stomach/repair.  Imaging was performed in the supine, bilateral lateral positions, bilateral posterior oblique positions and mild anterior oblique positions bilaterally.  Irregularity of the gastric wall is seen at the proximal stomach consistent with tumor and surgery.  No gastric outlet obstruction.  No extravasation of contrast from the stomach is identified.  Specifically no contrast is seen anterior  to the stomach on lateral/ anterior oblique views and the JP drain does not opacify with contrast.  Small amount of gastroesophageal reflux is seen around the nasogastric tube.  IMPRESSION: Wall irregularity at the proximal stomach consistent with tumor and surgery.  No contrast extravasation identified to suggest continued leak at the site of perforation/repair.  Findings discussed with Dr. Lovell Sheehan prior to dictation of this report.   Electronically Signed   By: Ulyses Southward M.D.   On: 07/12/2013 12:16   Dg C-arm 1-60 Min-no Report  07/06/2013   CLINICAL DATA: port a cath   C-ARM 1-60 MINUTES  Fluoroscopy was utilized by the requesting physician.  No radiographic  interpretation.    Ir Fluoro Guide Cv Midline Picc Left  07/09/2013   *RADIOLOGY REPORT*  Indication: In need of venous access for TPN  administration  ULTRASOUND AND FLUORSCOPIC GUIDED PICC LINE INSERTION  Intravenous Medications: None  Contrast: None  Fluoroscopy Time:  18 seconds  Complications: None immediate  Technique / Findings:  The procedure, risks, benefits, and alternatives were explained to the patient and informed written consent was obtained.  A timeout was performed prior to the initiation of the procedure.  The right upper extremity was prepped with chlorhexidine in a sterile fashion, and a sterile drape was applied covering the operative field.  Maximum barrier sterile technique with sterile gowns and gloves were used for the procedure.  A timeout was performed prior to the initiation of the procedure.  Local anesthesia was provided with 1% lidocaine.  Under direct ultrasound guidance, the rightbasilicvein was accessed with a micropuncture kit after the overlying soft tissues were anesthetized with 1% lidocaine.  An ultrasound image was saved for documentation purposes.  A guidewire was advanced to the level of the superior caval-atrial junction for measurement purposes and the PICC line was cut to length.  A peel-away sheath was placed and a 41 cm, 5 [place], dual lumen was inserted to level of the superior caval-atrial junction.  A post procedure spot fluoroscopic was obtained.  The catheter easily aspirated and flushed and was sutured in place.  A dressing was placed.  The patient tolerated the procedure well without immediate post procedural complication.  Impression:  Successful ultrasound and fluoroscopic guided placement of a right basilic vein approach, 41 cm, 5 French,dual lumen PICC with tip at the superior caval-atrial junction.  The PICC line is ready for immediate use.   Original Report Authenticated By: Tacey Ruiz, MD    ASSESSMENT:  #1. Stage IV adenocarcinoma of stomach, HER-2/neu negative, to begin chemotherapy today.  #2. Degenerative joint disease left knee. #3. Adverse effects on 10 mg of oxycodone. #4.  Acute blood loss anemia.   PLAN:  #1. Decrease oxycodone 5 mg as each doses administered. #2. Tylenol 650 mg every 6 hours as needed for left knee pain. #3. Day 1 cycle 1 of ECF today with followup in one week with CBC. Patient was told to call should she develop fever, diarrhea, hand-foot syndrome, or mucositis.   All questions were answered. The patient knows to call the clinic with any problems, questions or concerns. We can certainly see the patient much sooner if necessary.   I spent 25 minutes counseling the patient face to face. The total time spent in the appointment was 30 minutes.    Maurilio Lovely, MD 08/02/2013 9:58 AM

## 2013-08-03 ENCOUNTER — Telehealth (HOSPITAL_COMMUNITY): Payer: Self-pay

## 2013-08-03 NOTE — Telephone Encounter (Signed)
Spoke with Francena Hanly at home re: chemo toleration from yesterday.  Stated she did well.  No nausea, but does have meds for nausea should this occur.  No problems with continuous infusion pump.  Encouraged to call clinic with questions, problems etc.

## 2013-08-04 ENCOUNTER — Inpatient Hospital Stay (HOSPITAL_COMMUNITY): Payer: Medicare Other

## 2013-08-04 ENCOUNTER — Emergency Department (HOSPITAL_COMMUNITY)
Admission: EM | Admit: 2013-08-04 | Discharge: 2013-08-04 | Disposition: A | Discharge status: Home / Self Care | Payer: Medicare Other | Attending: Emergency Medicine | Admitting: Emergency Medicine

## 2013-08-04 ENCOUNTER — Encounter (HOSPITAL_COMMUNITY): Payer: Self-pay | Admitting: Emergency Medicine

## 2013-08-04 ENCOUNTER — Emergency Department (HOSPITAL_COMMUNITY): Payer: Medicare Other

## 2013-08-04 DIAGNOSIS — K219 Gastro-esophageal reflux disease without esophagitis: Secondary | ICD-10-CM | POA: Diagnosis not present

## 2013-08-04 DIAGNOSIS — I10 Essential (primary) hypertension: Secondary | ICD-10-CM | POA: Diagnosis not present

## 2013-08-04 DIAGNOSIS — Z9889 Other specified postprocedural states: Secondary | ICD-10-CM | POA: Diagnosis not present

## 2013-08-04 DIAGNOSIS — E7800 Pure hypercholesterolemia, unspecified: Secondary | ICD-10-CM | POA: Insufficient documentation

## 2013-08-04 DIAGNOSIS — Z79899 Other long term (current) drug therapy: Secondary | ICD-10-CM | POA: Diagnosis not present

## 2013-08-04 DIAGNOSIS — M25569 Pain in unspecified knee: Secondary | ICD-10-CM | POA: Diagnosis not present

## 2013-08-04 DIAGNOSIS — F17200 Nicotine dependence, unspecified, uncomplicated: Secondary | ICD-10-CM | POA: Insufficient documentation

## 2013-08-04 DIAGNOSIS — Z8739 Personal history of other diseases of the musculoskeletal system and connective tissue: Secondary | ICD-10-CM | POA: Diagnosis not present

## 2013-08-04 DIAGNOSIS — Z8711 Personal history of peptic ulcer disease: Secondary | ICD-10-CM | POA: Diagnosis not present

## 2013-08-04 DIAGNOSIS — Z86718 Personal history of other venous thrombosis and embolism: Secondary | ICD-10-CM | POA: Diagnosis not present

## 2013-08-04 DIAGNOSIS — M25562 Pain in left knee: Secondary | ICD-10-CM

## 2013-08-04 DIAGNOSIS — Z853 Personal history of malignant neoplasm of breast: Secondary | ICD-10-CM | POA: Insufficient documentation

## 2013-08-04 DIAGNOSIS — Z8619 Personal history of other infectious and parasitic diseases: Secondary | ICD-10-CM | POA: Diagnosis not present

## 2013-08-04 DIAGNOSIS — R51 Headache: Secondary | ICD-10-CM | POA: Diagnosis present

## 2013-08-04 DIAGNOSIS — R112 Nausea with vomiting, unspecified: Secondary | ICD-10-CM | POA: Insufficient documentation

## 2013-08-04 LAB — SEDIMENTATION RATE: Sed Rate: 29 mm/hr — ABNORMAL HIGH (ref 0–22)

## 2013-08-04 LAB — CBC
Hemoglobin: 9.4 g/dL — ABNORMAL LOW (ref 12.0–15.0)
MCH: 24.2 pg — ABNORMAL LOW (ref 26.0–34.0)
MCV: 74.6 fL — ABNORMAL LOW (ref 78.0–100.0)
RBC: 3.89 MIL/uL (ref 3.87–5.11)
RDW: 17.6 % — ABNORMAL HIGH (ref 11.5–15.5)
WBC: 5.1 10*3/uL (ref 4.0–10.5)

## 2013-08-04 MED ORDER — SODIUM CHLORIDE 0.9 % IV SOLN: 1000.0000 mL | Freq: Once | INTRAVENOUS | Status: DC

## 2013-08-04 MED ORDER — KETOROLAC TROMETHAMINE 30 MG/ML IJ SOLN
30.0000 mg | Freq: Once | INTRAMUSCULAR | Status: AC
  Administered 2013-08-04: 30 mg via INTRAVENOUS
  Filled 2013-08-04: qty 1

## 2013-08-04 MED ORDER — SODIUM CHLORIDE 0.9 % IV SOLN
1000.0000 mL | INTRAVENOUS | Status: DC
  Administered 2013-08-04: 1000 mL via INTRAVENOUS

## 2013-08-04 MED ORDER — MORPHINE SULFATE 4 MG/ML IJ SOLN
4.0000 mg | Freq: Once | INTRAMUSCULAR | Status: AC
  Administered 2013-08-04: 4 mg via INTRAVENOUS
  Filled 2013-08-04: qty 1

## 2013-08-04 MED ORDER — METOCLOPRAMIDE HCL 5 MG/ML IJ SOLN
10.0000 mg | Freq: Once | INTRAMUSCULAR | Status: AC
  Administered 2013-08-04: 10 mg via INTRAVENOUS
  Filled 2013-08-04: qty 2

## 2013-08-04 MED ORDER — DIPHENHYDRAMINE HCL 50 MG/ML IJ SOLN
25.0000 mg | Freq: Once | INTRAMUSCULAR | Status: AC
  Administered 2013-08-04: 25 mg via INTRAVENOUS
  Filled 2013-08-04: qty 1

## 2013-08-04 NOTE — ED Notes (Signed)
Pt has medication infusing by home pump. Pt complaining of headache and nausea. Family at the bedside.

## 2013-08-04 NOTE — ED Notes (Signed)
Pt states she started getting sick approx 9 pm, c/o headache and nausea.

## 2013-08-04 NOTE — ED Provider Notes (Signed)
CSN: [id number]     Arrival date & time 08/04/13  0058 History   First MD Initiated Contact with Patient 08/04/13 0158     Chief Complaint  Patient presents with  . Headache  . Nausea   (Consider location/radiation/quality/duration/timing/severity/associated sxs/prior Treatment) Patient is a 72 y.o. female presenting with headaches. The history is provided by the patient.  Headache She started on chemotherapy for stomach the stomach with first dose yesterday and she was sent home with an infusion of 5-FU. Yesterday, she had a bifrontal headache which is moderately severe but resolved. Tonight, she had recurrence of similar headache. Headache started at about midnight and she describes it as a dull throbbing pain across her forehead. She rates pain at 8/10. It is worse with noise but not with light. There is associated nausea and vomiting. She was prescribed medication for her nausea because of her chemotherapy but it is not helping. She denies weakness, numbness, tingling. Denies fever or chills. She denies visual change.  Past Medical History  Diagnosis Date  . Hypertension   . Paroxysmal SVT (supraventricular tachycardia)   . Hypercholesteremia   . GERD (gastroesophageal reflux disease)   . PUD (peptic ulcer disease)   . DVT (deep venous thrombosis) 2007  . Adrenal adenoma     left  . Thyroid nodule     left  . H. pylori infection   . Diverticulosis   . 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 Diarrhea     recurrent  . Arthritis   . Hiatal hernia   . Cancer     adenocarcinoma   Past Surgical History  Procedure Laterality Date  . Total knee arthroplasty  bilateral knee  . Hip  replacement  left  . Appendectomy    . Cholecystectomy    . Abdominal hysterectomy    . Breast lumpectomy      left  . Ablation      of SVT  . Cardiac catheterization  2009    normal coronary arteries  . Colonoscopy  09/13/2009    [id number]  internal hemorrhoids  . Esophagogastroduodenoscopy  09/13/2009    [id number]:[id number]/  . Esophagogastroduodenoscopy N/A 06/11/2013    Procedure: ESOPHAGOGASTRODUODENOSCOPY (EGD);  Surgeon: West Bali, MD;  [facility]: [facility];  Service: Endoscopy;  Laterality: N/A;  2:15-moved to 1045 [name] notified pt  . Portacath placement Left 07/06/2013    Procedure: INSERTION PORT-A-CATH;  Surgeon: Almond Lint, MD;  Location: [facility];  Service: General;  Laterality: Left;  . Laparotomy N/A 07/07/2013    Procedure: EXPLORATORY LAPAROTOMY;  Surgeon: Dalia Heading, MD;  Location: [facility];  Service: General;  Laterality: N/A;  . Gastrorrhaphy N/A 07/07/2013    Procedure: Ferrel Logan;  Surgeon: Dalia Heading, MD;  Location: [facility];  Service: General;  Laterality: N/A;   Family History  Problem Relation Age of Onset  . Colon cancer Neg Hx   . Cancer Mother     leukemia  . Cancer Sister     breast  . Heart disease Brother   . Heart disease Sister    History  Substance Use Topics  . Smoking status: Current Every Day Smoker -- 15 years  . Smokeless tobacco: Never Used     Comment: Quit smoking x 3 weeks  . Alcohol Use: No   OB History   Grav Para Term Preterm Abortions TAB SAB Ect Mult Living  Review of Systems  Neurological: Positive for headaches.  All other systems reviewed and are negative.    Allergies  Review of patient's allergies indicates no known allergies.  Home Medications   Current Outpatient Rx  Name  Route  Sig  Dispense  Refill  . dexamethasone (DECADRON) 4 MG tablet   Oral   Take 4 mg by mouth.         . dicyclomine (BENTYL) 20 MG tablet   Oral   Take 20 mg by mouth 2 (two) times daily at 8 am and 10 pm.         . diltiazem (TIAZAC) 360 MG 24 hr capsule   Oral   Take 360 mg by mouth every morning.          [name] Calcium (STOOL SOFTENER PO)   Oral   Take 1 capsule by mouth 2 (two) times daily.         . fish oil-omega-3 fatty acids  1000 MG capsule   Oral   Take 1 g by mouth daily.         Marland Kitchen lidocaine-prilocaine (EMLA) cream   Topical   Apply 1 application topically as needed.         . Linaclotide (LINZESS) 145 MCG CAPS capsule   Oral   Take 145 mcg by mouth daily.         Marland Kitchen lovastatin (MEVACOR) 40 MG tablet   Oral   Take 2 tablets (80 mg total) by mouth at bedtime.   60 tablet   11   . magnesium hydroxide (MILK OF MAGNESIA) 400 MG/5ML suspension   Oral   Take 30 mLs by mouth daily as needed for constipation.         . metoCLOPramide (REGLAN) 10 MG tablet   Oral   Take 1 tablet (10 mg total) by mouth 4 (four) times daily.   100 tablet   3   . metoprolol (TOPROL-XL) 50 MG 24 hr tablet   Oral   Take 50 mg by mouth every morning.          Marland Kitchen oxyCODONE (OXY IR/ROXICODONE) 5 MG immediate release tablet   Oral   Take 1 tablet (5 mg total) by mouth every 4 (four) hours as needed for pain.   60 tablet   0   . pantoprazole (PROTONIX) 40 MG tablet   Oral   Take 1 tablet (40 mg total) by mouth 2 (two) times daily.   60 tablet   2   . Probiotic Product (PROBIOTIC FORMULA PO)   Oral   Take 1 tablet by mouth every morning.         . prochlorperazine (COMPAZINE) 10 MG tablet   Oral   Take 1 tablet (10 mg total) by mouth every 6 (six) hours.   100 tablet   3   . temazepam (RESTORIL) 7.5 MG capsule   Oral   Take 7.5 mg by mouth at bedtime as needed for sleep.          BP 134/83  Pulse 68  Temp(Src) 98.7 F (37.1 C) (Oral)  Resp 16  Ht 5\' 6"  (1.676 m)  Wt 196 lb (88.905 kg)  BMI 31.65 kg/m2  SpO2 98% Physical Exam  Nursing note and vitals reviewed.  72 year old female, resting comfortably and in no acute distress. Vital signs are normal. Oxygen saturation is 98%, which is normal. Head is normocephalic and atraumatic. PERRLA, EOMI. Oropharynx is clear. Fundi show  no hemorrhage, exudate, or papilledema. There is tenderness to palpation over the temporalis muscles bilaterally with  the left being more tender than the right. Temporal arteries not definitely palpated. There is also mild tenderness to palpation at the insertion of the paracervical muscles bilaterally. Neck is nontender and supple without adenopathy or JVD. Back is nontender and there is no CVA tenderness. Lungs are clear without rales, wheezes, or rhonchi. Chest is nontender. She has a port in the left subclavian area for which she is getting her chemotherapy infusion. Heart has regular rate and rhythm without murmur. Abdomen is soft, flat, nontender without masses or hepatosplenomegaly and peristalsis is normoactive. Extremities have no cyanosis or edema, full range of motion is present. Skin is warm and dry without rash. Neurologic: Mental status is normal, cranial nerves are intact, there are no motor or sensory deficits.  ED Course  Procedures (including critical care time) Labs Review Results for orders placed during the hospital encounter of 08/04/13  CBC      Result Value Range   WBC 5.1  4.0 - 10.5 K/uL   RBC 3.89  3.87 - 5.11 MIL/uL   Hemoglobin 9.4 (*) 12.0 - 15.0 g/dL   HCT 16.1 (*) 09.6 - 04.5 %   MCV 74.6 (*) 78.0 - 100.0 fL   MCH 24.2 (*) 26.0 - 34.0 pg   MCHC 32.4  30.0 - 36.0 g/dL   RDW 40.9 (*) 81.1 - 91.4 %   Platelets 344  150 - 400 K/uL  SEDIMENTATION RATE      Result Value Range   Sed Rate 29 (*) 0 - 22 mm/hr   Imaging Review Ct Head Wo Contrast  08/04/2013   CLINICAL DATA:  Nausea and headache.  On chemotherapy.  EXAM: CT HEAD WITHOUT CONTRAST  TECHNIQUE: Contiguous axial images were obtained from the base of the skull through the vertex without intravenous contrast.  COMPARISON:  None available for comparison at time of study interpretation.  FINDINGS: The ventricles and sulci are normal for age. No intraparenchymal hemorrhage, mass effect nor midline shift. Patchy supratentorial white matter hypodensities are within normal range for patient's age and though non-specific  suggest sequelae of chronic small vessel ischemic disease. No acute large vascular territory infarcts.  No abnormal extra-axial fluid collections. Basal cisterns are patent. Moderate calcific atherosclerosis of the carotid siphons.  No skull fracture. Visualized paranasal sinuses and mastoid aircells are well-aerated. The included ocular globes and orbital contents are non-suspicious.  IMPRESSION: No acute intracranial process ; normal noncontrast CT of the head for age.   Electronically Signed   By: Awilda Metro   On: 08/04/2013 03:04    MDM   1. Headache   2. Pain in left knee    Headache which seems most likely to be muscle contraction headache. However, also need to rule out temporal arteritis a sedimentation rate will be obtained. She had a CBC done yesterday and platelet count was normal. It is unlikely that she has significant thrombocytopenia so soon after chemotherapy, but CBC will be rechecked to make sure she is not thrombocytopenic. Old records are reviewed and she had staging PET scan done last month which showed no increased metabolic activity in the brain, but she has not had a CT of her head. CT of the head will be obtained, but it is very unlikely that she has cerebral metastasis with recent negative PET scan. She will be treated with IV fluids and IV metoclopramide plus diphenhydramine and reassessed.  Partial relief of her headache with above noted medication. CT is negative, platelet count is normal, and sedimentation rate is only minimally elevated at 29. She's given a dose of ketorolac with good relief of her headache which is now complaining of pain in her left knee. This is a chronic issue as she is status post bilateral knee replacements. Her record on [place] controlled substance reporting website was reviewed and she has been getting prescriptions for a large number of oxycodone 5 mg tablets. She'll be given a dose of morphine for pain and discharged.  Dione Booze,  MD 08/04/13 [phone]

## 2013-08-06 ENCOUNTER — Encounter (HOSPITAL_COMMUNITY): Payer: Self-pay | Admitting: Emergency Medicine

## 2013-08-06 ENCOUNTER — Inpatient Hospital Stay (HOSPITAL_COMMUNITY)
Admission: EM | Admit: 2013-08-06 | Discharge: 2013-08-10 | DRG: 375 | Disposition: A | Discharge status: Home Health | Payer: Medicare Other | Attending: Internal Medicine | Admitting: Internal Medicine

## 2013-08-06 ENCOUNTER — Emergency Department (HOSPITAL_COMMUNITY): Payer: Medicare Other

## 2013-08-06 DIAGNOSIS — Z86718 Personal history of other venous thrombosis and embolism: Secondary | ICD-10-CM

## 2013-08-06 DIAGNOSIS — K921 Melena: Secondary | ICD-10-CM | POA: Diagnosis present

## 2013-08-06 DIAGNOSIS — E669 Obesity, unspecified: Secondary | ICD-10-CM | POA: Diagnosis present

## 2013-08-06 DIAGNOSIS — R7401 Elevation of levels of liver transaminase levels: Secondary | ICD-10-CM | POA: Diagnosis present

## 2013-08-06 DIAGNOSIS — M129 Arthropathy, unspecified: Secondary | ICD-10-CM | POA: Diagnosis present

## 2013-08-06 DIAGNOSIS — E663 Overweight: Secondary | ICD-10-CM

## 2013-08-06 DIAGNOSIS — R7402 Elevation of levels of lactic acid dehydrogenase (LDH): Secondary | ICD-10-CM | POA: Diagnosis present

## 2013-08-06 DIAGNOSIS — Z9221 Personal history of antineoplastic chemotherapy: Secondary | ICD-10-CM

## 2013-08-06 DIAGNOSIS — K219 Gastro-esophageal reflux disease without esophagitis: Secondary | ICD-10-CM

## 2013-08-06 DIAGNOSIS — D3500 Benign neoplasm of unspecified adrenal gland: Secondary | ICD-10-CM

## 2013-08-06 DIAGNOSIS — D62 Acute posthemorrhagic anemia: Secondary | ICD-10-CM

## 2013-08-06 DIAGNOSIS — E785 Hyperlipidemia, unspecified: Secondary | ICD-10-CM

## 2013-08-06 DIAGNOSIS — E7800 Pure hypercholesterolemia, unspecified: Secondary | ICD-10-CM | POA: Diagnosis present

## 2013-08-06 DIAGNOSIS — B370 Candidal stomatitis: Secondary | ICD-10-CM | POA: Diagnosis present

## 2013-08-06 DIAGNOSIS — Z79899 Other long term (current) drug therapy: Secondary | ICD-10-CM

## 2013-08-06 DIAGNOSIS — Z96649 Presence of unspecified artificial hip joint: Secondary | ICD-10-CM

## 2013-08-06 DIAGNOSIS — C169 Malignant neoplasm of stomach, unspecified: Principal | ICD-10-CM

## 2013-08-06 DIAGNOSIS — D630 Anemia in neoplastic disease: Secondary | ICD-10-CM | POA: Diagnosis present

## 2013-08-06 DIAGNOSIS — F17200 Nicotine dependence, unspecified, uncomplicated: Secondary | ICD-10-CM

## 2013-08-06 DIAGNOSIS — K5900 Constipation, unspecified: Secondary | ICD-10-CM | POA: Diagnosis present

## 2013-08-06 DIAGNOSIS — F410 Panic disorder [episodic paroxysmal anxiety] without agoraphobia: Secondary | ICD-10-CM | POA: Diagnosis present

## 2013-08-06 DIAGNOSIS — Z806 Family history of leukemia: Secondary | ICD-10-CM

## 2013-08-06 DIAGNOSIS — Z66 Do not resuscitate: Secondary | ICD-10-CM | POA: Diagnosis present

## 2013-08-06 DIAGNOSIS — Z8249 Family history of ischemic heart disease and other diseases of the circulatory system: Secondary | ICD-10-CM

## 2013-08-06 DIAGNOSIS — Z8711 Personal history of peptic ulcer disease: Secondary | ICD-10-CM

## 2013-08-06 DIAGNOSIS — Z96659 Presence of unspecified artificial knee joint: Secondary | ICD-10-CM

## 2013-08-06 DIAGNOSIS — I10 Essential (primary) hypertension: Secondary | ICD-10-CM

## 2013-08-06 LAB — COMPREHENSIVE METABOLIC PANEL
ALT: 148 U/L — ABNORMAL HIGH (ref 0–35)
AST: 63 U/L — ABNORMAL HIGH (ref 0–37)
Albumin: 2.6 g/dL — ABNORMAL LOW (ref 3.5–5.2)
CO2: 28 mEq/L (ref 19–32)
Chloride: 96 mEq/L (ref 96–112)
GFR calc non Af Amer: 63 mL/min — ABNORMAL LOW (ref >90)
Sodium: 134 mEq/L — ABNORMAL LOW (ref 135–145)
Total Bilirubin: 0.3 mg/dL (ref 0.3–1.2)

## 2013-08-06 LAB — TROPONIN I: Troponin I: <0.3 ng/mL (ref <0.30)

## 2013-08-06 LAB — CBC WITH DIFFERENTIAL/PLATELET
Basophils Absolute: 0 10*3/uL (ref 0.0–0.1)
Hemoglobin: 7.8 g/dL — ABNORMAL LOW (ref 12.0–15.0)
Lymphocytes Relative: 13 % (ref 12–46)
Lymphs Abs: 0.5 10*3/uL — ABNORMAL LOW (ref 0.7–4.0)
MCV: 74.3 fL — ABNORMAL LOW (ref 78.0–100.0)
Monocytes Absolute: 0.1 10*3/uL (ref 0.1–1.0)
Neutro Abs: 3.4 10*3/uL (ref 1.7–7.7)
Neutrophils Relative %: 85 % — ABNORMAL HIGH (ref 43–77)
Platelets: 316 10*3/uL (ref 150–400)
RBC: 3.27 MIL/uL — ABNORMAL LOW (ref 3.87–5.11)
RDW: 17.1 % — ABNORMAL HIGH (ref 11.5–15.5)
WBC: 4 10*3/uL (ref 4.0–10.5)

## 2013-08-06 LAB — HEMOGLOBIN AND HEMATOCRIT, BLOOD
HCT: 22.3 % — ABNORMAL LOW (ref 36.0–46.0)
Hemoglobin: 7.3 g/dL — ABNORMAL LOW (ref 12.0–15.0)
Hemoglobin: 7.1 g/dL — ABNORMAL LOW (ref 12.0–15.0)

## 2013-08-06 LAB — PROTIME-INR: Prothrombin Time: 14.4 seconds (ref 11.6–15.2)

## 2013-08-06 MED ORDER — DILTIAZEM HCL ER COATED BEADS 120 MG PO CP24
360.0000 mg | ORAL_CAPSULE | Freq: Every day | ORAL | Status: DC
  Administered 2013-08-08 – 2013-08-10 (×3): 360 mg via ORAL
  Filled 2013-08-06 (×3): qty 3

## 2013-08-06 MED ORDER — PANTOPRAZOLE SODIUM 40 MG IV SOLR
40.0000 mg | Freq: Two times a day (BID) | INTRAVENOUS | Status: DC
  Administered 2013-08-06 – 2013-08-09 (×6): 40 mg via INTRAVENOUS
  Filled 2013-08-06 (×6): qty 40

## 2013-08-06 MED ORDER — OXYCODONE HCL 5 MG PO TABS
5.0000 mg | ORAL_TABLET | ORAL | Status: DC | PRN
  Administered 2013-08-06 – 2013-08-10 (×11): 5 mg via ORAL
  Filled 2013-08-06 (×12): qty 1

## 2013-08-06 MED ORDER — TEMAZEPAM 7.5 MG PO CAPS
7.5000 mg | ORAL_CAPSULE | Freq: Every evening | ORAL | Status: DC | PRN
  Administered 2013-08-06 – 2013-08-09 (×4): 7.5 mg via ORAL
  Filled 2013-08-06 (×4): qty 1

## 2013-08-06 MED ORDER — SODIUM CHLORIDE 0.9 % IV SOLN
INTRAVENOUS | Status: DC
  Administered 2013-08-07 – 2013-08-09 (×6): via INTRAVENOUS

## 2013-08-06 MED ORDER — DOCUSATE SODIUM 100 MG PO CAPS
100.0000 mg | ORAL_CAPSULE | Freq: Two times a day (BID) | ORAL | Status: DC
  Administered 2013-08-06 – 2013-08-10 (×8): 100 mg via ORAL
  Filled 2013-08-06 (×8): qty 1

## 2013-08-06 MED ORDER — SORBITOL 70 % SOLN: 30.0000 mL | Freq: Every day | Status: DC | PRN

## 2013-08-06 MED ORDER — ONDANSETRON HCL 4 MG/2ML IJ SOLN: 4.0000 mg | Freq: Once | INTRAMUSCULAR | Status: DC

## 2013-08-06 MED ORDER — ONDANSETRON 8 MG PO TBDP
8.0000 mg | ORAL_TABLET | Freq: Once | ORAL | Status: AC
  Administered 2013-08-06: 8 mg via ORAL
  Filled 2013-08-06: qty 1

## 2013-08-06 MED ORDER — POLYETHYLENE GLYCOL 3350 17 G PO PACK: 17.0000 g | PACK | Freq: Every day | ORAL | Status: DC | PRN

## 2013-08-06 MED ORDER — METOCLOPRAMIDE HCL 10 MG PO TABS
10.0000 mg | ORAL_TABLET | Freq: Four times a day (QID) | ORAL | Status: DC
  Administered 2013-08-06 – 2013-08-10 (×14): 10 mg via ORAL
  Filled 2013-08-06 (×15): qty 1

## 2013-08-06 MED ORDER — OMEGA-3-ACID ETHYL ESTERS 1 G PO CAPS
1.0000 g | ORAL_CAPSULE | Freq: Every day | ORAL | Status: DC
  Administered 2013-08-06 – 2013-08-10 (×5): 1 g via ORAL
  Filled 2013-08-06 (×5): qty 1

## 2013-08-06 MED ORDER — LINACLOTIDE 145 MCG PO CAPS
145.0000 ug | ORAL_CAPSULE | Freq: Every day | ORAL | Status: DC
  Administered 2013-08-07 – 2013-08-10 (×4): 145 ug via ORAL
  Filled 2013-08-06 (×5): qty 1

## 2013-08-06 MED ORDER — MORPHINE SULFATE 2 MG/ML IJ SOLN
2.0000 mg | INTRAMUSCULAR | Status: DC | PRN
  Administered 2013-08-06 – 2013-08-10 (×15): 2 mg via INTRAVENOUS
  Filled 2013-08-06 (×15): qty 1

## 2013-08-06 MED ORDER — SODIUM CHLORIDE 0.9 % IV SOLN
INTRAVENOUS | Status: DC
  Administered 2013-08-06: 17:00:00 via INTRAVENOUS

## 2013-08-06 MED ORDER — ATORVASTATIN CALCIUM 20 MG PO TABS
20.0000 mg | ORAL_TABLET | Freq: Every day | ORAL | Status: DC
  Administered 2013-08-06 – 2013-08-09 (×4): 20 mg via ORAL
  Filled 2013-08-06 (×4): qty 1

## 2013-08-06 MED ORDER — ONDANSETRON HCL 4 MG/2ML IJ SOLN
4.0000 mg | Freq: Once | INTRAMUSCULAR | Status: DC
  Filled 2013-08-06: qty 2

## 2013-08-06 MED ORDER — ACETAMINOPHEN 325 MG PO TABS: 650.0000 mg | ORAL_TABLET | Freq: Four times a day (QID) | ORAL | Status: DC | PRN

## 2013-08-06 MED ORDER — SIMVASTATIN 40 MG PO TABS: 40.0000 mg | ORAL_TABLET | Freq: Every day | ORAL | Status: DC

## 2013-08-06 MED ORDER — LIDOCAINE-PRILOCAINE 2.5-2.5 % EX CREA: 1.0000 "application " | TOPICAL_CREAM | CUTANEOUS | Status: DC | PRN

## 2013-08-06 MED ORDER — ONDANSETRON HCL 4 MG/2ML IJ SOLN
4.0000 mg | Freq: Once | INTRAMUSCULAR | Status: AC
  Administered 2013-08-06: 4 mg via INTRAVENOUS

## 2013-08-06 MED ORDER — DICYCLOMINE HCL 20 MG PO TABS
20.0000 mg | ORAL_TABLET | Freq: Two times a day (BID) | ORAL | Status: DC
  Filled 2013-08-06 (×6): qty 1

## 2013-08-06 MED ORDER — ACETAMINOPHEN 650 MG RE SUPP: 650.0000 mg | Freq: Four times a day (QID) | RECTAL | Status: DC | PRN

## 2013-08-06 MED ORDER — METOPROLOL SUCCINATE ER 50 MG PO TB24
50.0000 mg | ORAL_TABLET | Freq: Every day | ORAL | Status: DC
  Filled 2013-08-06: qty 1

## 2013-08-06 MED ORDER — SODIUM CHLORIDE 0.9 % IV SOLN
INTRAVENOUS | Status: AC
  Administered 2013-08-06: 19:00:00 via INTRAVENOUS

## 2013-08-06 MED ORDER — DEXAMETHASONE 4 MG PO TABS: 4.0000 mg | ORAL_TABLET | ORAL | Status: DC

## 2013-08-06 NOTE — ED Notes (Signed)
Pt reports 3-4 episodes bright red and dark black stool which began today. Pt states she is currently receiving chemo tx for stomach cancer. Pt states she's had past episodes of bloody stools but was unsure if she had to receive blood due to episode. Pt reports "some" abdominal discomfort, SOB, weakness. Pt denies chest pain.

## 2013-08-06 NOTE — Progress Notes (Signed)
Notified MD about HGB of 7.3.  MD responded and will check H&H again at 01:00, 08/07/13.  Will continue to monitor patient.  Ellene Route 08/06/2013

## 2013-08-06 NOTE — ED Notes (Deleted)
Pt c/o left side chest pain x3 week. Pt describes pain as sharp and "mostly constant". Pt also reports SOB, weakness, N/V, headache. Pt has hx of stroke.    

## 2013-08-06 NOTE — ED Provider Notes (Signed)
CSN: [id number]     Arrival date & time 08/06/13  1503 History  This chart was scribed for Gilda Crease, MD by Joaquin Music, ED Scribe. This patient was seen in room [facility] and the patient's care was started at  3:17 PM  Chief Complaint  Patient presents with  . Rectal Bleeding   The history is provided by the patient. No language interpreter was used.   HPI Comments: [name] is a 72 y.o. female with a hx of adenocarcinoma who presents to the Emergency Department complaining of ongoing rectal bleeding with associated nausea, weakness, dizziness, and tenderness to palpation onset today. Pt states she is in pain and would like to have a bowel movement now. She states she has had 3 bowel movements today that contain black and red stool. Pt states she had an abd ulcer that busted and had to go in for emergency surgery done by Dr. Lovell Sheehan. She states she is being treated for stomach cancer with chemotherapy as of 07/26/2013. She denies ever having low blood counts but had a blood transfusion on her last visit to the ED. Pt denies emesis and chest pain.   Past Medical History  Diagnosis Date  . Hypertension   . Paroxysmal SVT (supraventricular tachycardia)   . Hypercholesteremia   . GERD (gastroesophageal reflux disease)   . PUD (peptic ulcer disease)   . DVT (deep venous thrombosis) 2007  . Adrenal adenoma     left  . Thyroid nodule     left  . H. pylori infection   . Diverticulosis   . 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 Diarrhea     recurrent  . Arthritis   . Hiatal hernia   . Cancer     adenocarcinoma   Past Surgical History  Procedure Laterality Date  . Total knee arthroplasty  bilateral knee  . Hip  replacement  left  . Appendectomy    . Cholecystectomy    . Abdominal hysterectomy    . Breast lumpectomy      left  . Ablation      of SVT  . Cardiac catheterization  2009    normal coronary arteries   . Colonoscopy  09/13/2009    JXB:[id number] internal hemorrhoids  . Esophagogastroduodenoscopy  09/13/2009    NGE:[id number]/  . Esophagogastroduodenoscopy N/A 06/11/2013    Procedure: ESOPHAGOGASTRODUODENOSCOPY (EGD);  Surgeon: West Bali, MD;  Location: [facility];  Service: Endoscopy;  Laterality: N/A;  2:15-moved to 1045 [name] notified pt  . Portacath placement Left 07/06/2013    Procedure: INSERTION PORT-A-CATH;  Surgeon: Almond Lint, MD;  Location: [facility];  Service: General;  Laterality: Left;  . Laparotomy N/A 07/07/2013    Procedure: EXPLORATORY LAPAROTOMY;  Surgeon: Dalia Heading, MD;  Location: [facility];  Service: General;  Laterality: N/A;  . Gastrorrhaphy N/A 07/07/2013    Procedure: Ferrel Logan;  Surgeon: Dalia Heading, MD;  Location: [facility];  Service: General;  Laterality: N/A;   Family History  Problem Relation Age of Onset  . Colon cancer Neg Hx   . Cancer Mother     leukemia  . Cancer Sister     breast  . Heart disease Brother   . Heart disease Sister    History  Substance Use Topics  . Smoking status: Current Every Day Smoker -- 15 years  . Smokeless tobacco: Never Used     Comment: Quit smoking x 3 weeks  .  Alcohol Use: No   OB History   Grav Para Term Preterm Abortions TAB SAB Ect Mult Living                 Review of Systems  Gastrointestinal: Positive for nausea, blood in stool and anal bleeding.  Neurological: Positive for dizziness and weakness.    Allergies  Review of patient's allergies indicates no known allergies.  Home Medications   Current Outpatient Rx  Name  Route  Sig  Dispense  Refill  . dexamethasone (DECADRON) 4 MG tablet   Oral   Take 4 mg by mouth.         . dicyclomine (BENTYL) 20 MG tablet   Oral   Take 20 mg by mouth 2 (two) times daily at 8 am and 10 pm.         . diltiazem (TIAZAC) 360 MG 24 hr capsule   Oral   Take 360 mg by mouth every morning.          [name] Calcium (STOOL SOFTENER PO)    Oral   Take 1 capsule by mouth 2 (two) times daily.         . fish oil-omega-3 fatty acids 1000 MG capsule   Oral   Take 1 g by mouth daily.         Marland Kitchen lidocaine-prilocaine (EMLA) cream   Topical   Apply 1 application topically as needed.         . Linaclotide (LINZESS) 145 MCG CAPS capsule   Oral   Take 145 mcg by mouth daily.         Marland Kitchen lovastatin (MEVACOR) 40 MG tablet   Oral   Take 2 tablets (80 mg total) by mouth at bedtime.   60 tablet   11   . magnesium hydroxide (MILK OF MAGNESIA) 400 MG/5ML suspension   Oral   Take 30 mLs by mouth daily as needed for constipation.         . metoCLOPramide (REGLAN) 10 MG tablet   Oral   Take 1 tablet (10 mg total) by mouth 4 (four) times daily.   100 tablet   3   . metoprolol (TOPROL-XL) 50 MG 24 hr tablet   Oral   Take 50 mg by mouth every morning.          Marland Kitchen oxyCODONE (OXY IR/ROXICODONE) 5 MG immediate release tablet   Oral   Take 1 tablet (5 mg total) by mouth every 4 (four) hours as needed for pain.   60 tablet   0   . pantoprazole (PROTONIX) 40 MG tablet   Oral   Take 1 tablet (40 mg total) by mouth 2 (two) times daily.   60 tablet   2   . Probiotic Product (PROBIOTIC FORMULA PO)   Oral   Take 1 tablet by mouth every morning.         . prochlorperazine (COMPAZINE) 10 MG tablet   Oral   Take 1 tablet (10 mg total) by mouth every 6 (six) hours.   100 tablet   3   . temazepam (RESTORIL) 7.5 MG capsule   Oral   Take 7.5 mg by mouth at bedtime as needed for sleep.          Triage Vitals:BP 122/58  Pulse 62  Temp(Src) 98.1 F (36.7 C) (Oral)  Resp 16  Ht 5\' 6"  (1.676 m)  Wt 196 lb (88.905 kg)  BMI 31.65 kg/m2  SpO2 98%  Physical  Exam  Constitutional: She is oriented to person, place, and time. She appears well-developed and well-nourished. No distress.  HENT:  Head: Normocephalic and atraumatic.  Right Ear: Hearing normal.  Left Ear: Hearing normal.  Nose: Nose normal.  Mouth/Throat:  Oropharynx is clear and moist and mucous membranes are normal.  Eyes: Conjunctivae and EOM are normal. Pupils are equal, round, and reactive to light.  Neck: Normal range of motion. Neck supple.  Cardiovascular: Regular rhythm, S1 normal and S2 normal.  Exam reveals no gallop and no friction rub.   No murmur heard. Pulmonary/Chest: Effort normal and breath sounds normal. No respiratory distress. She exhibits no tenderness.  Abdominal: Soft. Normal appearance and bowel sounds are normal. There is no hepatosplenomegaly. There is no tenderness. There is no rebound, no guarding, no tenderness at McBurney's point and negative Murphy's sign. No hernia.  Tenderness to palpation in epigastric region.  Musculoskeletal: Normal range of motion.  Neurological: She is alert and oriented to person, place, and time. She has normal strength. No cranial nerve deficit or sensory deficit. Coordination normal. GCS eye subscore is 4. GCS verbal subscore is 5. GCS motor subscore is 6.  Skin: Skin is warm, dry and intact. No rash noted. No cyanosis.  Psychiatric: She has a normal mood and affect. Her speech is normal and behavior is normal. Thought content normal.    ED Course  Procedures  DIAGNOSTIC STUDIES: Oxygen Saturation is 98% on RA, normal by my interpretation.    COORDINATION OF CARE: 3:23 PM-Discussed treatment plan which includes labs and X-Rays. Pt agreed to plan.   Labs Review Labs Reviewed  CBC WITH DIFFERENTIAL - Abnormal; Notable for the following:    RBC 3.27 (*)    Hemoglobin 7.8 (*)    HCT 24.3 (*)    MCV 74.3 (*)    MCH 23.9 (*)    RDW 17.1 (*)    Neutrophils Relative % 85 (*)    Lymphs Abs 0.5 (*)    Monocytes Relative 2 (*)    All other components within normal limits  COMPREHENSIVE METABOLIC PANEL - Abnormal; Notable for the following:    Sodium 134 (*)    Glucose, Bld 171 (*)    BUN 31 (*)    Albumin 2.6 (*)    AST 63 (*)    ALT 148 (*)    GFR calc non Af Amer 63 (*)     GFR calc Af Amer 73 (*)    All other components within normal limits  TROPONIN I  LIPASE, BLOOD  PROTIME-INR  TYPE AND SCREEN   Imaging Review Dg Abd Acute W/chest  08/06/2013   CLINICAL DATA:  History of bleeding ulcer  EXAM: ACUTE ABDOMEN SERIES (ABDOMEN 2 VIEW & CHEST 1 VIEW)  COMPARISON:  None.  FINDINGS: Cardiac shadow is within normal limits. A left-sided chest wall port is seen with the catheter tip in the proximal superior vena cava. Lungs are clear bilaterally.  The abdomen shows a nonobstructive bowel gas pattern. Degenerative changes of the lumbar spine are seen. No abnormal mass or abnormal calcifications are noted. A left hip replacement is seen.  IMPRESSION: No acute abnormality noted.   Electronically Signed   By: Alcide Clever M.D.   On: 08/06/2013 16:43    EKG Interpretation     Ventricular Rate:  58 PR Interval:  144 QRS Duration: 80 QT Interval:  406 QTC Calculation: 398 R Axis:   26 Text Interpretation:  Sinus bradycardia Otherwise normal ECG  When compared with ECG of 31-Jul-2013 04:42, Premature atrial complexes are no longer Present            MDM  Diagnosis: GI bleed secondary to gastric carcinoma  She presents to the ER for evaluation of rectal bleeding including some red blood per rectum and melanotic stools. Patient has a history of recent diagnosis of gastric carcinoma after she had a perforated stomach secondary to tumor. The patient underwent surgery and has recently been started on chemotherapy. Today she started having episodes of dark stools which are heme positive. She is hemodynamically stable. Hemoglobin has dropped to 7.8, likely will go further down and she will require transfusion. She is not in any distress. X-ray does not show any free air. Patient will be admitted to the hospital for further management.  I personally performed the services described in this documentation, which was scribed in my presence. The recorded information has been  reviewed and is accurate.    Gilda Crease, MD 08/06/13 1723

## 2013-08-06 NOTE — ED Notes (Signed)
Pt states rectal bleeding began today. States ~3-4 stools with both bright red blood and black stool. Currently being treated for stomach cancer with chemo currently infusing through port-a-cath.

## 2013-08-06 NOTE — H&P (Signed)
[facility] History and Physical  RETAJ HILBUN ZOX:[id number] DOB: [birth year] DOA: 08/06/2013  Referring physician: Emergency Department PCP: Evlyn Courier, MD  Specialists:   Chief Complaint: GI bleed  HPI: [name] is a 72 y.o. female  With a hx of recently diagnosed adenocarcinoma of the stomach who presents to the ED with complaints of BRBPR as well as black tarry stools. The patient is currently undergoing chemo and recently had an exploratory laparotomy and patching of gastric perforation by Dr. Lovell Sheehan. In the ED, the patient was found to have a presenting hgb of 7.8 (was 8.8 four days prior). The hospitalist was consulted for admission.  Review of Systems:  Per above, the remainder of the 10pt ros reviewed and are neg  Past Medical History  Diagnosis Date  . Hypertension   . Paroxysmal SVT (supraventricular tachycardia)   . Hypercholesteremia   . GERD (gastroesophageal reflux disease)   . PUD (peptic ulcer disease)   . DVT (deep venous thrombosis) 2007  . Adrenal adenoma     left  . Thyroid nodule     left  . H. pylori infection   . Diverticulosis   . 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 Diarrhea     recurrent  . Arthritis   . Hiatal hernia   . Cancer     adenocarcinoma   Past Surgical History  Procedure Laterality Date  . Total knee arthroplasty  bilateral knee  . Hip  replacement  left  . Appendectomy    . Cholecystectomy    . Abdominal hysterectomy    . Breast lumpectomy      left  . Ablation      of SVT  . Cardiac catheterization  2009    normal coronary arteries  . Colonoscopy  09/13/2009    WJX:[id number] internal hemorrhoids  . Esophagogastroduodenoscopy  09/13/2009    ONG:[id number]/  . Esophagogastroduodenoscopy N/A 06/11/2013    Procedure: ESOPHAGOGASTRODUODENOSCOPY (EGD);  Surgeon: West Bali, MD;  [facility]: [facility];  Service: Endoscopy;  Laterality: N/A;  2:15-moved to 1045  [name] notified pt  . Portacath placement Left 07/06/2013    Procedure: INSERTION PORT-A-CATH;  Surgeon: Almond Lint, MD;  Location: [facility];  Service: General;  Laterality: Left;  . Laparotomy N/A 07/07/2013    Procedure: EXPLORATORY LAPAROTOMY;  Surgeon: Dalia Heading, MD;  Location: [facility];  Service: General;  Laterality: N/A;  . Gastrorrhaphy N/A 07/07/2013    Procedure: Ferrel Logan;  Surgeon: Dalia Heading, MD;  Location: [facility];  Service: General;  Laterality: N/A;   Social History:  reports that she has been smoking.  She has never used smokeless tobacco. She reports that she does not drink alcohol or use illicit drugs.  where does patient live--home, ALF, SNF? and with whom if at home?  Can patient participate in ADLs?  No Known Allergies  Family History  Problem Relation Age of Onset  . Colon cancer Neg Hx   . Cancer Mother     leukemia  . Cancer Sister     breast  . Heart disease Brother   . Heart disease Sister     (be sure to complete)  Prior to Admission medications   Medication Sig Start Date End Date Taking? Authorizing Provider  oxyCODONE (OXY IR/ROXICODONE) 5 MG immediate release tablet Take 1 tablet (5 mg total) by mouth every 4 (four) hours as needed for pain. 07/27/13  Yes Ellouise Newer,  PA-C  dexamethasone (DECADRON) 4 MG tablet Take 4 mg by mouth.    Historical Provider, MD  dicyclomine (BENTYL) 20 MG tablet Take 20 mg by mouth 2 (two) times daily at 8 am and 10 pm.    Historical Provider, MD  diltiazem (TIAZAC) 360 MG 24 hr capsule Take 360 mg by mouth every morning.  05/01/12   Gaylord Shih, MD  Docusate Calcium (STOOL SOFTENER PO) Take 1 capsule by mouth 2 (two) times daily.    Historical Provider, MD  fish oil-omega-3 fatty acids 1000 MG capsule Take 1 g by mouth daily.    Historical Provider, MD  lidocaine-prilocaine (EMLA) cream Apply 1 application topically as needed.    Historical Provider, MD  Linaclotide Karlene Einstein) 145 MCG CAPS capsule Take 145  mcg by mouth daily.    Historical Provider, MD  lovastatin (MEVACOR) 40 MG tablet Take 2 tablets (80 mg total) by mouth at bedtime. 11/11/12   Gaylord Shih, MD  magnesium hydroxide (MILK OF MAGNESIA) 400 MG/5ML suspension Take 30 mLs by mouth daily as needed for constipation.    Historical Provider, MD  metoCLOPramide (REGLAN) 10 MG tablet Take 1 tablet (10 mg total) by mouth 4 (four) times daily. 07/15/13   Dalia Heading, MD  metoprolol (TOPROL-XL) 50 MG 24 hr tablet Take 50 mg by mouth every morning.     Historical Provider, MD  pantoprazole (PROTONIX) 40 MG tablet Take 1 tablet (40 mg total) by mouth 2 (two) times daily. 07/15/13   Dalia Heading, MD  Probiotic Product (PROBIOTIC FORMULA PO) Take 1 tablet by mouth every morning.    Historical Provider, MD  prochlorperazine (COMPAZINE) 10 MG tablet Take 1 tablet (10 mg total) by mouth every 6 (six) hours. 06/24/13   Alla German, MD  temazepam (RESTORIL) 7.5 MG capsule Take 7.5 mg by mouth at bedtime as needed for sleep.    Historical Provider, MD   Physical Exam: Filed Vitals:   08/06/13 1510  BP: 122/58  Pulse: 62  Temp: 98.1 F (36.7 C)  TempSrc: Oral  Resp: 16  Height: 5\' 6"  (1.676 m)  Weight: 88.905 kg (196 lb)  SpO2: 98%     General:  Awake, in nad  Eyes: PERRL B  ENT: membranes moist, dentition fair  Neck: trachea midline, neck supple  Cardiovascular: regular, s1, s2  Respiratory: normal resp effort, no wheezing  Abdomen: soft, nondistended  Skin: normal skin turgor, no abnormal skin lesions seen  Musculoskeletal: perfused, no clubbing or cyanosis  Psychiatric: normal mood/affect, no auditory/visual hallucinations  Neurologic: cn2-12 grossly intact, strength/sensation intact  Labs on Admission:  Basic Metabolic Panel:  Recent Labs Lab 07/31/13 0452 08/02/13 0826 08/06/13 1540  NA 139 140 134*  K 3.4* 3.8 4.3  CL 101 103 96  CO2 30 31 28   GLUCOSE 124* 117* 171*  BUN 9 8 31*  CREATININE 0.72 0.71  0.89  CALCIUM 9.2 9.2 9.3  MG  --  2.1  --    Liver Function Tests:  Recent Labs Lab 07/31/13 0452 08/02/13 0826 08/06/13 1540  AST 31 32 63*  ALT 73* 54* 148*  ALKPHOS 74 75 60  BILITOT 0.3 0.3 0.3  PROT 6.6 6.7 6.1  ALBUMIN 2.8* 2.8* 2.6*    Recent Labs Lab 07/31/13 0452 08/06/13 1540  LIPASE 25 21   No results found for this basename: AMMONIA,  in the last 168 hours CBC:  Recent Labs Lab 07/31/13 0452 08/02/13 0826 08/04/13 0222 08/06/13  1540  WBC 5.5 6.7 5.1 4.0  NEUTROABS 3.5 4.9  --  3.4  HGB 9.1* 8.8* 9.4* 7.8*  HCT 29.3* 28.4* 29.0* 24.3*  MCV 76.7* 77.0* 74.6* 74.3*  PLT 358 372 344 316   Cardiac Enzymes:  Recent Labs Lab 07/31/13 0452 08/06/13 1540  TROPONINI <0.30 <0.30    BNP (last 3 results) No results found for this basename: PROBNP,  in the last 8760 hours CBG: No results found for this basename: GLUCAP,  in the last 168 hours  Radiological Exams on Admission: Dg Abd Acute W/chest  08/06/2013   CLINICAL DATA:  History of bleeding ulcer  EXAM: ACUTE ABDOMEN SERIES (ABDOMEN 2 VIEW & CHEST 1 VIEW)  COMPARISON:  None.  FINDINGS: Cardiac shadow is within normal limits. A left-sided chest wall port is seen with the catheter tip in the proximal superior vena cava. Lungs are clear bilaterally.  The abdomen shows a nonobstructive bowel gas pattern. Degenerative changes of the lumbar spine are seen. No abnormal mass or abnormal calcifications are noted. A left hip replacement is seen.  IMPRESSION: No acute abnormality noted.   Electronically Signed   By: Alcide Clever M.D.   On: 08/06/2013 16:43    Assessment/Plan Principal Problem:   Acute blood loss anemia Active Problems:   HYPERTENSION   GASTRIC ULCER, HX OF   Adenocarcinoma of stomach   1. Acute blood loss anemia 1. Upper vs lower source 2. Will follow CBC and transfuse as needed 3. Will cont on bid protonix IV for now 4. Will consult GI for further recs 5. Clear liquid diet and NPO  after midnight for now 6. Admit to med bed, inpt 2. HTN 1. BP stable 2. Cont meds for now 3. Hx of adenocarcinoma 1. Currently undergoing chemo 2. Will defer tx to Oncology 4. DVT prophylaxis 1. SCD's  Code Status: Full (must indicate code status--if unknown or must be presumed, indicate so) Family Communication: Pt in room (indicate person spoken with, if applicable, with phone number if by telephone) Disposition Plan: Pending (indicate anticipated LOS)  Time spent:  Selvin Yun K [facility] Pager [phone]  If 7PM-7AM, please contact night-coverage [email or web address] Password [facility] 08/06/2013, 6:17 PM

## 2013-08-06 NOTE — ED Notes (Signed)
Pt complain of nausea. EDP aware

## 2013-08-07 DIAGNOSIS — I10 Essential (primary) hypertension: Secondary | ICD-10-CM

## 2013-08-07 DIAGNOSIS — I2699 Other pulmonary embolism without acute cor pulmonale: Secondary | ICD-10-CM

## 2013-08-07 HISTORY — DX: Other pulmonary embolism without acute cor pulmonale: I26.99

## 2013-08-07 LAB — COMPREHENSIVE METABOLIC PANEL
ALT: 165 U/L — ABNORMAL HIGH (ref 0–35)
CO2: 31 mEq/L (ref 19–32)
Calcium: 9 mg/dL (ref 8.4–10.5)
Creatinine, Ser: 0.8 mg/dL (ref 0.50–1.10)
GFR calc Af Amer: 83 mL/min — ABNORMAL LOW (ref >90)
Glucose, Bld: 114 mg/dL — ABNORMAL HIGH (ref 70–99)
Sodium: 133 mEq/L — ABNORMAL LOW (ref 135–145)
Total Bilirubin: 0.5 mg/dL (ref 0.3–1.2)
Total Protein: 5.7 g/dL — ABNORMAL LOW (ref 6.0–8.3)

## 2013-08-07 LAB — CBC
HCT: 29.8 % — ABNORMAL LOW (ref 36.0–46.0)
MCH: 25.7 pg — ABNORMAL LOW (ref 26.0–34.0)
MCHC: 33.2 g/dL (ref 30.0–36.0)
MCV: 77.4 fL — ABNORMAL LOW (ref 78.0–100.0)
Platelets: 199 10*3/uL (ref 150–400)
RBC: 3.85 MIL/uL — ABNORMAL LOW (ref 3.87–5.11)

## 2013-08-07 LAB — PREPARE RBC (CROSSMATCH)

## 2013-08-07 LAB — HEMOGLOBIN AND HEMATOCRIT, BLOOD: HCT: 20 % — ABNORMAL LOW (ref 36.0–46.0)

## 2013-08-07 MED ORDER — ALPRAZOLAM 0.5 MG PO TABS
0.5000 mg | ORAL_TABLET | ORAL | Status: DC | PRN
  Administered 2013-08-07 – 2013-08-08 (×4): 0.5 mg via ORAL
  Filled 2013-08-07 (×4): qty 1

## 2013-08-07 MED ORDER — DICYCLOMINE HCL 10 MG PO CAPS: 20.0000 mg | ORAL_CAPSULE | Freq: Two times a day (BID) | ORAL | Status: DC | PRN

## 2013-08-07 MED ORDER — DICYCLOMINE HCL 10 MG PO CAPS
20.0000 mg | ORAL_CAPSULE | Freq: Two times a day (BID) | ORAL | Status: DC
  Administered 2013-08-07: 20 mg via ORAL
  Filled 2013-08-07: qty 2

## 2013-08-07 NOTE — Progress Notes (Signed)
Covering for Dr. Lovell Sheehan.  Chart reviewed and pt. Examined.  In essence, pt. Is anemic l;ikely from chemo and tumor oozing.  Her abdomen is completely soft without any guarding. Clinically she has not re perforated her stomach CA.  She is comfortable and is not a surgical candidate.  Would not advise endoscopy as there is certainly a real risk of re perforation.   I discussed situation with family members and they are going to address her code status with hospitalist. Suggest comfort measures and hospice in the near future.  Filed Vitals:   08/07/13 0931  BP: 143/83  Pulse: 56  Temp: 98 F (36.7 C)  Resp: 20

## 2013-08-07 NOTE — Progress Notes (Signed)
[facility] PROGRESS NOTE  [name] WUJ:[id number] DOB: 18-May-1941 DOA: 08/06/2013 PCP: Sharon Courier, MD  Assessment/Plan: Acute blood loss anemia  1. Upper vs lower source 2. Will follow CBC and transfuse as needed 3. Given 2 units overnight 4. Will cont on bid protonix IV for now 5. Will f/u GI recs 6. Appreciate Surgery input 7. Admit to med bed, inpt HTN  1. BP stable 2. Cont meds for now Hx of adenocarcinoma  1. Currently undergoing chemo 2. Stage 4 adenocarcinoma of the stomach 3. Due for chemo on 08/09/13 4. Will defer tx to Oncology DVT prophylaxis  1. SCD's  Code Status: DNR - discussed with patient and her family, all are in agreement Family Communication: Pt and family in room Disposition Plan: Pending   Consultants:  GI  Surgery  HPI/Subjective: No acute events. Pt reports feeling better s/p transfusion overnight  Objective: Filed Vitals:   08/07/13 0812 08/07/13 0827 08/07/13 0931 08/07/13 1035  BP: 170/81 148/69 143/83 161/65  Pulse: 55 59 56 58  Temp: 98.2 F (36.8 C) 98 F (36.7 C) 98 F (36.7 C) 97.7 F (36.5 C)  TempSrc: Oral Oral Oral Oral  Resp: 18 18 20 18   Height:      Weight:      SpO2: 100% 100% 100% 100%    Intake/Output Summary (Last 24 hours) at 08/07/13 1333 Last data filed at 08/07/13 9562  Gross per 24 hour  Intake  827.5 ml  Output      0 ml  Net  827.5 ml   Filed Weights   08/06/13 1510 08/06/13 1827  Weight: 88.905 kg (196 lb) 90.1 kg (198 lb 10.2 oz)    Exam:   General:  Awake, in nad  Cardiovascular: regular, s1, s2  Respiratory: normal resp effort, no wheezing  Abdomen: soft, nondistended  Musculoskeletal: perfused, no clubbing   Data Reviewed: Basic Metabolic Panel:  Recent Labs Lab 08/02/13 0826 08/06/13 1540 08/07/13 1157  NA 140 134* 133*  K 3.8 4.3 4.1  CL 103 96 97  CO2 31 28 31   GLUCOSE 117* 171* 114*  BUN 8 31* 20  CREATININE 0.71 0.89 0.80  CALCIUM 9.2 9.3 9.0  MG  2.1  --   --    Liver Function Tests:  Recent Labs Lab 08/02/13 0826 08/06/13 1540 08/07/13 1157  AST 32 63* 70*  ALT 54* 148* 165*  ALKPHOS 75 60 50  BILITOT 0.3 0.3 0.5  PROT 6.7 6.1 5.7*  ALBUMIN 2.8* 2.6* 2.5*    Recent Labs Lab 08/06/13 1540  LIPASE 21   No results found for this basename: AMMONIA,  in the last 168 hours CBC:  Recent Labs Lab 08/02/13 0826 08/04/13 0222 08/06/13 1540 08/06/13 1832 08/06/13 2228 08/07/13 0115 08/07/13 1157  WBC 6.7 5.1 4.0  --   --   --  6.5  NEUTROABS 4.9  --  3.4  --   --   --   --   HGB 8.8* 9.4* 7.8* 7.3* 7.1* 6.4* 9.9*  HCT 28.4* 29.0* 24.3* 23.0* 22.3* 20.0* 29.8*  MCV 77.0* 74.6* 74.3*  --   --   --  77.4*  PLT 372 344 316  --   --   --  199   Cardiac Enzymes:  Recent Labs Lab 08/06/13 1540  TROPONINI <0.30   BNP (last 3 results) No results found for this basename: PROBNP,  in the last 8760 hours CBG: No results found for this basename: GLUCAP,  in the last 168 hours  Recent Results (from the past 240 hour(s))  URINE CULTURE     Status: None   Collection Time    07/31/13  5:52 AM      Result Value Range Status   Specimen Description URINE, CLEAN CATCH   Final   Special Requests NONE   Final   Culture  Setup Time     Final   Value: 07/31/2013 23:37     Performed at [facility] Count     Final   Value: 5,000 COLONIES/ML     Performed at [facility]   Culture     Final   Value: INSIGNIFICANT GROWTH     Performed at [facility]   Report Status 08/02/2013 FINAL   Final     Studies: Dg Abd Acute W/chest  08/06/2013   CLINICAL DATA:  History of bleeding ulcer  EXAM: ACUTE ABDOMEN SERIES (ABDOMEN 2 VIEW & CHEST 1 VIEW)  COMPARISON:  None.  FINDINGS: Cardiac shadow is within normal limits. A left-sided chest wall port is seen with the catheter tip in the proximal superior vena cava. Lungs are clear bilaterally.  The abdomen shows a nonobstructive bowel gas pattern.  Degenerative changes of the lumbar spine are seen. No abnormal mass or abnormal calcifications are noted. A left hip replacement is seen.  IMPRESSION: No acute abnormality noted.   Electronically Signed   By: [name] M.D.   On: 08/06/2013 16:43    Scheduled Meds: . atorvastatin  20 mg Oral q1800  . dexamethasone  4 mg Oral See admin instructions  . diltiazem  360 mg Oral Daily  . docusate sodium  100 mg Oral BID  . Linaclotide  145 mcg Oral Daily  . metoCLOPramide  10 mg Oral QID  . metoprolol succinate  50 mg Oral Daily  . omega-3 acid ethyl esters  1 g Oral Daily  . pantoprazole (PROTONIX) IV  40 mg Intravenous Q12H   Continuous Infusions: . sodium chloride 100 mL/hr at 08/06/13 1650  . sodium chloride      Principal Problem:   Acute blood loss anemia Active Problems:   HYPERTENSION   GASTRIC ULCER, HX OF   Adenocarcinoma of stomach  Time spent:  [name] K  [facility] Pager [phone]. If 7PM-7AM, please contact night-coverage at [email or web address], password [facility] 08/07/2013, 1:33 PM  LOS: 1 day

## 2013-08-07 NOTE — Progress Notes (Signed)
Spoke with Dr. Orvan Falconer about giving blood at the same time as continuous chemo.  Dr. Orvan Falconer confirmed that blood could be given through different access than the chemo and given at the same time. Will continue to monitor patient.   Ellene Route 08/07/2013

## 2013-08-07 NOTE — Consult Note (Signed)
Referring Provider: No ref. provider found Primary Care Physician:  Evlyn Courier, MD Primary Gastroenterologist:  Jonette Eva  Reason for Consultation: MELENA  HPI:  PT WITH KNOWN HISTORY OF STAGE IV GASTRIC CA. RECEIVED CHEMO OCT 23. PRESENTED WITH BLACK TARRY STOOLS. OCCASIONAL NAUSEA. PT DENIES CHEST PAIN,SOB, FEVER, CHILLS, BRBPR, vomiting, constipation, abd pain, problems swallowing, or heartburn or indigestion.  Past Medical History  Diagnosis Date  . Hypertension   . Paroxysmal SVT (supraventricular tachycardia)   . Hypercholesteremia   . GERD (gastroesophageal reflux disease)   . PUD (peptic ulcer disease)   . DVT (deep venous thrombosis) 2007  . Adrenal adenoma     left  . Thyroid nodule     left  . H. pylori infection   . Diverticulosis   . 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 Diarrhea     recurrent  . Arthritis   . Hiatal hernia   . Cancer     adenocarcinoma    Past Surgical History  Procedure Laterality Date  . Total knee arthroplasty  bilateral knee  . Hip  replacement  left  . Appendectomy    . Cholecystectomy    . Abdominal hysterectomy    . Breast lumpectomy      left  . Ablation      of SVT  . Cardiac catheterization  2009    normal coronary arteries  . Colonoscopy  09/13/2009    ZOX:[id number] internal hemorrhoids  . Esophagogastroduodenoscopy  09/13/2009    FAO:[id number]/  . Esophagogastroduodenoscopy N/A 06/11/2013    Procedure: ESOPHAGOGASTRODUODENOSCOPY (EGD);  Surgeon: West Bali, MD;  [facility];  Service: Endoscopy;  Laterality: N/A;  2:15-moved to 1045 [name] notified pt  . Portacath placement Left 07/06/2013    Procedure: INSERTION PORT-A-CATH;  Surgeon: Almond Lint, MD;  Location: [facility];  Service: General;  Laterality: Left;  . Laparotomy N/A 07/07/2013    Procedure: EXPLORATORY LAPAROTOMY;  Surgeon: Dalia Heading, MD;  Location: [facility];  Service: General;  Laterality:  N/A;  . Gastrorrhaphy N/A 07/07/2013    Procedure: Ferrel Logan;  Surgeon: Dalia Heading, MD;  Location: [facility];  Service: General;  Laterality: N/A;    Prior to Admission medications   Medication Sig Start Date End Date Taking? Authorizing Provider  dicyclomine (BENTYL) 20 MG tablet Take 20 mg by mouth 2 (two) times daily at 8 am and 10 pm.   Yes Historical Provider, MD  lidocaine-prilocaine (EMLA) cream Apply 1 application topically as needed (for port access).    Yes Historical Provider, MD  lovastatin (MEVACOR) 40 MG tablet Take 2 tablets (80 mg total) by mouth at bedtime. 11/11/12  Yes Gaylord Shih, MD  metoprolol (TOPROL-XL) 50 MG 24 hr tablet Take 50 mg by mouth every morning.    Yes Historical Provider, MD  oxyCODONE (OXY IR/ROXICODONE) 5 MG immediate release tablet Take 1 tablet (5 mg total) by mouth every 4 (four) hours as needed for pain. 07/27/13  Yes Maurine Minister Kefalas, PA-C  pantoprazole (PROTONIX) 40 MG tablet Take 1 tablet (40 mg total) by mouth 2 (two) times daily. 07/15/13  Yes Dalia Heading, MD  prochlorperazine (COMPAZINE) 10 MG tablet Take 1 tablet (10 mg total) by mouth every 6 (six) hours. 06/24/13  Yes Alla German, MD  dexamethasone (DECADRON) 4 MG tablet Take 4 mg by mouth See admin instructions.     Historical Provider, MD  diltiazem (TIAZAC) 360 MG 24  hr capsule Take 360 mg by mouth every morning.  05/01/12   Gaylord Shih, MD  Docusate Calcium (STOOL SOFTENER PO) Take 1 capsule by mouth 2 (two) times daily.    Historical Provider, MD  fish oil-omega-3 fatty acids 1000 MG capsule Take 1 g by mouth daily.    Historical Provider, MD  Linaclotide Karlene Einstein) 145 MCG CAPS capsule Take 145 mcg by mouth daily.    Historical Provider, MD  magnesium hydroxide (MILK OF MAGNESIA) 400 MG/5ML suspension Take 30 mLs by mouth daily as needed for constipation.    Historical Provider, MD  metoCLOPramide (REGLAN) 10 MG tablet Take 1 tablet (10 mg total) by mouth 4 (four) times daily.  07/15/13   Dalia Heading, MD  Probiotic Product (PROBIOTIC FORMULA PO) Take 1 tablet by mouth every morning.    Historical Provider, MD  temazepam (RESTORIL) 7.5 MG capsule Take 7.5 mg by mouth at bedtime as needed for sleep.    Historical Provider, MD    Current Facility-Administered Medications  Medication Dose Route Frequency Provider Last Rate Last Dose  . 0.9 %  sodium chloride infusion   Intravenous Continuous Gilda Crease, MD 100 mL/hr at 08/06/13 1650    . 0.9 %  sodium chloride infusion   Intravenous Continuous Jerald Kief, MD      . acetaminophen (TYLENOL) tablet 650 mg  650 mg Oral Q6H PRN Jerald Kief, MD       Or  . acetaminophen (TYLENOL) suppository 650 mg  650 mg Rectal Q6H PRN Jerald Kief, MD      . atorvastatin (LIPITOR) tablet 20 mg  20 mg Oral q1800 Jerald Kief, MD   20 mg at 08/06/13 1957  . dexamethasone (DECADRON) tablet 4 mg  4 mg Oral See admin instructions Jerald Kief, MD      . dicyclomine (BENTYL) capsule 20 mg  20 mg Oral BID AC & HS Jerald Kief, MD   20 mg at 08/07/13 0801  . diltiazem (CARDIZEM CD) 24 hr capsule 360 mg  360 mg Oral Daily Jerald Kief, MD      . docusate sodium (COLACE) capsule 100 mg  100 mg Oral BID Jerald Kief, MD   100 mg at 08/07/13 0806  . lidocaine-prilocaine (EMLA) cream 1 application  1 application Topical PRN Jerald Kief, MD      . Linaclotide [facility]) capsule 145 mcg  145 mcg Oral Daily Jerald Kief, MD      . metoCLOPramide [facility]) tablet 10 mg  10 mg Oral QID Jerald Kief, MD   10 mg at 08/06/13 2224  . metoprolol succinate (TOPROL-XL) 24 hr tablet 50 mg  50 mg Oral Daily Jerald Kief, MD      . morphine 2 MG/ML injection 2 mg  2 mg Intravenous Q4H PRN Jerald Kief, MD   2 mg at 08/07/13 0204  . omega-3 acid ethyl esters (LOVAZA) capsule 1 g  1 g Oral Daily Jerald Kief, MD   1 g at 08/06/13 1957  . oxyCODONE (Oxy IR/ROXICODONE) immediate release tablet 5 mg  5 mg Oral Q4H PRN Jerald Kief, MD   5 mg at 08/07/13 0520  . pantoprazole (PROTONIX) injection 40 mg  40 mg Intravenous Q12H Jerald Kief, MD   40 mg at 08/07/13 0806  . polyethylene glycol (MIRALAX / GLYCOLAX) packet 17 g  17 g Oral Daily PRN Jerald Kief, MD      .  sorbitol 70 % solution 30 mL  30 mL Oral Daily PRN Jerald Kief, MD      . temazepam (RESTORIL) capsule 7.5 mg  7.5 mg Oral QHS PRN Jerald Kief, MD   7.5 mg at 08/06/13 2258    Allergies as of 08/06/2013  . (No Known Allergies)    Family History:  Colon Cancer  negative                           Polyps  negative   History   Social History  . Marital Status: Widowed    Spouse Name: N/A    Number of Children: N/A  . Years of Education: N/A   Occupational History  . Not on file.   Social History Main Topics  . Smoking status: Current Every Day Smoker -- 15 years  . Smokeless tobacco: Never Used     Comment: Quit smoking x 3 weeks  . Alcohol Use: No  . Drug Use: No  . Sexual Activity: Not on file   Other Topics Concern  . Not on file   Social History Narrative   HUSBAND PASSED MAR 2014. EATS OUT A LOT.    Review of Systems: PER HPI OTHERWISE ALL SYSTEMS ARE NEGATIVE.  Vitals: Blood pressure 148/69, pulse 59, temperature 98 F (36.7 C), temperature source Oral, resp. rate 18, height 5\' 6"  (1.676 m), weight 198 lb 10.2 oz (90.1 kg), SpO2 100.00%.  Physical Exam: General:   Alert,  Well-developed, well-nourished, pleasant and cooperative in NAD Head:  Normocephalic and atraumatic. Mouth:  No lesions Neck:  Supple; no masses Lungs:  Clear throughout to auscultation.   No wheezes. No acute distress. Heart:  Regular rate and rhythm; no murmurs. Abdomen:  Soft, nontender and nondistended. No masses noted. Normal bowel sounds, without guarding, and without rebound.    Msk:  Symmetrical. Normal posture. Extremities:  Without edema. Neurologic:  Alert and  oriented x4;  grossly normal neurologically. Cervical Nodes:  No  significant cervical adenopathy. Psych:  Alert and cooperative. Normal mood and affect.   Lab Results:  Recent Labs  08/06/13 1540 08/06/13 1832 08/06/13 2228 08/07/13 0115  WBC 4.0  --   --   --   HGB 7.8* 7.3* 7.1* 6.4*  HCT 24.3* 23.0* 22.3* 20.0*  PLT 316  --   --   --    BMET  Recent Labs  08/06/13 1540  NA 134*  K 4.3  CL 96  CO2 28  GLUCOSE 171*  BUN 31*  CREATININE 0.89  CALCIUM 9.3   LFT  Recent Labs  08/06/13 1540  PROT 6.1  ALBUMIN 2.6*  AST 63*  ALT 148*  ALKPHOS 60  BILITOT 0.3     Studies/Results: OCT 31: AAS-NACPD  Impression: STAGE IV GASTRIC CA PRESENTING WITH MELENA.HB 6.4. RECENT OMENTAL PATCH(OCT 1).  Plan: 1. WILL DISCUSS POSSIBLE APC OF SURFACE OF TUMOR WITH [facility] GI DEPT & DR. Lovell Sheehan 2. SUPPORTIVE CARE 3. TRANSFUSE AS NEEDED 4. CHANGE BENTYL TO PRN. IT CONTRIBUTES TO CONSTIPATION. 5. CONTINUE COLACE,LINZESS, AND MIRALAX FOR CONSTIPATION. 6. BID PPI 7. SOFT MECHANICAL DIET   LOS: 1 day   [facility]  08/07/2013, 8:55 AM

## 2013-08-08 LAB — TYPE AND SCREEN
ABO/RH(D): O POS
Antibody Screen: NEGATIVE
Unit division: 0

## 2013-08-08 LAB — CBC
HCT: 30.2 % — ABNORMAL LOW (ref 36.0–46.0)
MCH: 26.1 pg (ref 26.0–34.0)
MCHC: 33.8 g/dL (ref 30.0–36.0)
MCV: 77.2 fL — ABNORMAL LOW (ref 78.0–100.0)
RDW: 16.6 % — ABNORMAL HIGH (ref 11.5–15.5)

## 2013-08-08 MED ORDER — LABETALOL HCL 5 MG/ML IV SOLN: 5.0000 mg | INTRAVENOUS | Status: DC | PRN

## 2013-08-08 MED ORDER — METOPROLOL SUCCINATE ER 50 MG PO TB24
75.0000 mg | ORAL_TABLET | Freq: Every day | ORAL | Status: DC
  Administered 2013-08-08 – 2013-08-10 (×3): 75 mg via ORAL
  Filled 2013-08-08 (×5): qty 1

## 2013-08-08 MED ORDER — ALPRAZOLAM 1 MG PO TABS
1.0000 mg | ORAL_TABLET | ORAL | Status: DC | PRN
  Administered 2013-08-08 – 2013-08-10 (×3): 1 mg via ORAL
  Filled 2013-08-08 (×3): qty 1

## 2013-08-08 MED ORDER — ALPRAZOLAM 0.5 MG PO TABS
0.5000 mg | ORAL_TABLET | ORAL | Status: AC
  Administered 2013-08-08: 0.5 mg via ORAL
  Filled 2013-08-08: qty 1

## 2013-08-08 NOTE — Progress Notes (Signed)
Subjective: Since I last evaluated the patient CONTINUES WITH BLACK STOOLS/ S/P 2u pRBCS BUT HB IS STABLE. TOLERATING POs. HAD AN ANXIETY ATTACK AFTER CONVERSATION WITH ? DRs. OCCASIONAL NAUSEA. NO ABD PAIN OR VOMTIING. NO BRBPR.  Objective: Vital signs in last 24 hours: Temp:  [97.6 F (36.4 C)-98.3 F (36.8 C)] 97.6 F (36.4 C) (11/02 0620) Pulse Rate:  [67-72] 67 (11/02 0620) Resp:  [18] 18 (11/02 0620) BP: (131-158)/(67-77) 131/67 mmHg (11/02 0620) SpO2:  [98 %-100 %] 99 % (11/02 0620) Last BM Date: 08/07/13  Intake/Output from previous day: 11/01 0701 - 11/02 0700 In: 252.5 [P.O.:240; Blood:12.5] Out: -  Intake/Output this shift: Total I/O In: 200 [P.O.:200] Out: -   General appearance: alert, cooperative and no distress,  LUNGS: CLEAR HEART: REGULAR ABDOMEN: SOFT, NON-TENDER, NON-DISTENDED NEURO: NL AFFECT, ANXIOUS MOOD  Lab Results:  Recent Labs  08/06/13 1540  08/07/13 0115 08/07/13 1157 08/08/13 0603  WBC 4.0  --   --  6.5 9.1  HGB 7.8*  < > 6.4* 9.9* 10.2*  HCT 24.3*  < > 20.0* 29.8* 30.2*  PLT 316  --   --  199 169  < > = values in this interval not displayed. BMET  Recent Labs  08/06/13 1540 08/07/13 1157  NA 134* 133*  K 4.3 4.1  CL 96 97  CO2 28 31  GLUCOSE 171* 114*  BUN 31* 20  CREATININE 0.89 0.80  CALCIUM 9.3 9.0   LFT  Recent Labs  08/07/13 1157  PROT 5.7*  ALBUMIN 2.5*  AST 70*  ALT 165*  ALKPHOS 50  BILITOT 0.5   PT/INR  Recent Labs  08/06/13 1540  LABPROT 14.4  INR 1.14   Hepatitis Panel No results found for this basename: HEPBSAG, HCVAB, HEPAIGM, HEPBIGM,  in the last 72 hours C-Diff No results found for this basename: CDIFFTOX,  in the last 72 hours Fecal Lactopherrin No results found for this basename: FECLLACTOFRN,  in the last 72 hours  Studies/Results: Dg Abd Acute W/chest  08/06/2013   CLINICAL DATA:  History of bleeding ulcer  EXAM: ACUTE ABDOMEN SERIES (ABDOMEN 2 VIEW & CHEST 1 VIEW)  COMPARISON:   None.  FINDINGS: Cardiac shadow is within normal limits. A left-sided chest wall port is seen with the catheter tip in the proximal superior vena cava. Lungs are clear bilaterally.  The abdomen shows a nonobstructive bowel gas pattern. Degenerative changes of the lumbar spine are seen. No abnormal mass or abnormal calcifications are noted. A left hip replacement is seen.  IMPRESSION: No acute abnormality noted.   Electronically Signed   By: Alcide Clever M.D.   On: 08/06/2013 16:43    Medications: I have reviewed the patient's current medications.  Assessment/Plan: STAGE IV GASTRIC CA JUST STARTED FIRST ROUND OF CHEMORx. EVIDENCE FOR OOZING FROM GASTRIC TUMOR AFTER CHEMO BUT NO FRANK GI BLEED.  PLAN: 1. MONITOR SX 2. WILL DISCUSS CASE WITH [facility] GI AND DR. Lovell Sheehan 3. BID PPI 4. SOFT MECH DIET 5. DISCUSSED WITH PT AND FAMILY. DNR DOES NOT MEAN DO NOT TREAT. DISCUSSED WITH PT/FAMILY SHOULD WAIT UNTIL CHEMO CYCLE IS COMPLETE TO DRAW ANY CONCLUSIONS.    LOS: 2 days   Arlene Brickel 08/08/2013, 11:57 AM

## 2013-08-08 NOTE — Progress Notes (Addendum)
Pt HR sustained in the 120s/130s. MD has been paged, orders received. Will continue to monitor.

## 2013-08-08 NOTE — Progress Notes (Signed)
Pt HR now down in the low 100s. Will continue to monitor.

## 2013-08-08 NOTE — Progress Notes (Signed)
[facility] PROGRESS NOTE  LAPARIS DURRETT ZOX:[id number] DOB: 1941/02/17 DOA: 08/06/2013 PCP: Evlyn Courier, MD  Assessment/Plan: Acute blood loss anemia  1. Setting of adenocarcinoma of the stomach while undergoing chemo 2. Will follow CBC and transfuse as needed 3. Pt is s/p 2 units PRBC's 4. Will cont on bid protonix IV for now 5. Appreciate Surgery and GI input HTN  1. BP stable 2. Cont meds for now Hx of adenocarcinoma  1. Currently undergoing chemo 2. Stage 4 adenocarcinoma of the stomach 3. Due for chemo on 08/09/13 DVT prophylaxis  1. SCD's  Code Status: DNR - discussed with patient and her family, all are in agreement Family Communication: Pt and family in room Disposition Plan: Pending   Consultants:  GI  Surgery  HPI/Subjective: No acute events. Pt reports feeling better s/p transfusion overnight  Objective: Filed Vitals:   08/07/13 1035 08/07/13 1408 08/07/13 2145 08/08/13 0620  BP: 161/65 138/71 158/77 131/67  Pulse: 58 70 72 67  Temp: 97.7 F (36.5 C) 98.3 F (36.8 C) 98.1 F (36.7 C) 97.6 F (36.4 C)  TempSrc: Oral Oral Oral Oral  Resp: 18 18 18 18   Height:      Weight:      SpO2: 100% 98% 100% 99%    Intake/Output Summary (Last 24 hours) at 08/08/13 0805 Last data filed at 08/07/13 1839  Gross per 24 hour  Intake  252.5 ml  Output      0 ml  Net  252.5 ml   Filed Weights   08/06/13 1510 08/06/13 1827  Weight: 88.905 kg (196 lb) 90.1 kg (198 lb 10.2 oz)    Exam:   General:  Awake, in nad  Cardiovascular: regular, s1, s2  Respiratory: normal resp effort, no wheezing  Abdomen: soft, nondistended  Musculoskeletal: perfused, no clubbing   Data Reviewed: Basic Metabolic Panel:  Recent Labs Lab 08/02/13 0826 08/06/13 1540 08/07/13 1157  NA 140 134* 133*  K 3.8 4.3 4.1  CL 103 96 97  CO2 31 28 31   GLUCOSE 117* 171* 114*  BUN 8 31* 20  CREATININE 0.71 0.89 0.80  CALCIUM 9.2 9.3 9.0  MG 2.1  --   --    Liver  Function Tests:  Recent Labs Lab 08/02/13 0826 08/06/13 1540 08/07/13 1157  AST 32 63* 70*  ALT 54* 148* 165*  ALKPHOS 75 60 50  BILITOT 0.3 0.3 0.5  PROT 6.7 6.1 5.7*  ALBUMIN 2.8* 2.6* 2.5*    Recent Labs Lab 08/06/13 1540  LIPASE 21   No results found for this basename: AMMONIA,  in the last 168 hours CBC:  Recent Labs Lab 08/02/13 0826 08/04/13 0222 08/06/13 1540 08/06/13 1832 08/06/13 2228 08/07/13 0115 08/07/13 1157 08/08/13 0603  WBC 6.7 5.1 4.0  --   --   --  6.5 9.1  NEUTROABS 4.9  --  3.4  --   --   --   --   --   HGB 8.8* 9.4* 7.8* 7.3* 7.1* 6.4* 9.9* 10.2*  HCT 28.4* 29.0* 24.3* 23.0* 22.3* 20.0* 29.8* 30.2*  MCV 77.0* 74.6* 74.3*  --   --   --  77.4* 77.2*  PLT 372 344 316  --   --   --  199 169   Cardiac Enzymes:  Recent Labs Lab 08/06/13 1540  TROPONINI <0.30   BNP (last 3 results) No results found for this basename: PROBNP,  in the last 8760 hours CBG: No results found for this  basename: GLUCAP,  in the last 168 hours  Recent Results (from the past 240 hour(s))  URINE CULTURE     Status: None   Collection Time    07/31/13  5:52 AM      Result Value Range Status   Specimen Description URINE, CLEAN CATCH   Final   Special Requests NONE   Final   Culture  Setup Time     Final   Value: 07/31/2013 23:37     Performed at [facility] Count     Final   Value: 5,000 COLONIES/ML     Performed at [facility]   Culture     Final   Value: INSIGNIFICANT GROWTH     Performed at [facility]   Report Status 08/02/2013 FINAL   Final     Studies: Dg Abd Acute W/chest  08/06/2013   CLINICAL DATA:  History of bleeding ulcer  EXAM: ACUTE ABDOMEN SERIES (ABDOMEN 2 VIEW & CHEST 1 VIEW)  COMPARISON:  None.  FINDINGS: Cardiac shadow is within normal limits. A left-sided chest wall port is seen with the catheter tip in the proximal superior vena cava. Lungs are clear bilaterally.  The abdomen shows a nonobstructive  bowel gas pattern. Degenerative changes of the lumbar spine are seen. No abnormal mass or abnormal calcifications are noted. A left hip replacement is seen.  IMPRESSION: No acute abnormality noted.   Electronically Signed   By: Alcide Clever M.D.   On: 08/06/2013 16:43    Scheduled Meds: . atorvastatin  20 mg Oral q1800  . dexamethasone  4 mg Oral See admin instructions  . diltiazem  360 mg Oral Daily  . docusate sodium  100 mg Oral BID  . Linaclotide  145 mcg Oral Daily  . metoCLOPramide  10 mg Oral QID  . metoprolol succinate  50 mg Oral Daily  . omega-3 acid ethyl esters  1 g Oral Daily  . pantoprazole (PROTONIX) IV  40 mg Intravenous Q12H   Continuous Infusions: . sodium chloride 100 mL/hr at 08/06/13 1650  . sodium chloride 75 mL/hr at 08/08/13 1610    Principal Problem:   Acute blood loss anemia Active Problems:   HYPERTENSION   GASTRIC ULCER, HX OF   Adenocarcinoma of stomach  Time spent:  CHIU, STEPHEN K  [facility] Pager [phone]. If 7PM-7AM, please contact night-coverage at [email or web address], password [facility] 08/08/2013, 8:05 AM  LOS: 2 days

## 2013-08-09 ENCOUNTER — Inpatient Hospital Stay (HOSPITAL_BASED_OUTPATIENT_CLINIC_OR_DEPARTMENT_OTHER): Payer: Medicare Other

## 2013-08-09 ENCOUNTER — Ambulatory Visit (HOSPITAL_COMMUNITY): Payer: Medicare Other | Admitting: Oncology

## 2013-08-09 ENCOUNTER — Inpatient Hospital Stay (HOSPITAL_COMMUNITY): Payer: Medicare Other

## 2013-08-09 DIAGNOSIS — E663 Overweight: Secondary | ICD-10-CM

## 2013-08-09 DIAGNOSIS — I10 Essential (primary) hypertension: Secondary | ICD-10-CM

## 2013-08-09 DIAGNOSIS — C169 Malignant neoplasm of stomach, unspecified: Secondary | ICD-10-CM

## 2013-08-09 DIAGNOSIS — K219 Gastro-esophageal reflux disease without esophagitis: Secondary | ICD-10-CM

## 2013-08-09 DIAGNOSIS — Z5111 Encounter for antineoplastic chemotherapy: Secondary | ICD-10-CM

## 2013-08-09 DIAGNOSIS — B370 Candidal stomatitis: Secondary | ICD-10-CM

## 2013-08-09 DIAGNOSIS — R109 Unspecified abdominal pain: Secondary | ICD-10-CM

## 2013-08-09 DIAGNOSIS — R300 Dysuria: Secondary | ICD-10-CM

## 2013-08-09 LAB — CBC WITH DIFFERENTIAL/PLATELET
Basophils Absolute: 0 10*3/uL (ref 0.0–0.1)
Eosinophils Absolute: 0 10*3/uL (ref 0.0–0.7)
Eosinophils Relative: 0 % (ref 0–5)
Hemoglobin: 10.1 g/dL — ABNORMAL LOW (ref 12.0–15.0)
Lymphs Abs: 0.7 10*3/uL (ref 0.7–4.0)
Monocytes Relative: 0 % — ABNORMAL LOW (ref 3–12)
Neutro Abs: 7.9 10*3/uL — ABNORMAL HIGH (ref 1.7–7.7)
Neutrophils Relative %: 92 % — ABNORMAL HIGH (ref 43–77)
Platelets: 143 10*3/uL — ABNORMAL LOW (ref 150–400)
RBC: 3.94 MIL/uL (ref 3.87–5.11)
WBC: 8.6 10*3/uL (ref 4.0–10.5)

## 2013-08-09 LAB — COMPREHENSIVE METABOLIC PANEL
ALT: 310 U/L — ABNORMAL HIGH (ref 0–35)
AST: 118 U/L — ABNORMAL HIGH (ref 0–37)
Albumin: 2.4 g/dL — ABNORMAL LOW (ref 3.5–5.2)
Alkaline Phosphatase: 59 U/L (ref 39–117)
BUN: 7 mg/dL (ref 6–23)
Chloride: 96 mEq/L (ref 96–112)
GFR calc Af Amer: >90 mL/min (ref >90)
Glucose, Bld: 154 mg/dL — ABNORMAL HIGH (ref 70–99)
Potassium: 3.7 mEq/L (ref 3.5–5.1)
Sodium: 135 mEq/L (ref 135–145)
Total Bilirubin: 0.4 mg/dL (ref 0.3–1.2)
Total Protein: 5.9 g/dL — ABNORMAL LOW (ref 6.0–8.3)

## 2013-08-09 LAB — OCCULT BLOOD, POC DEVICE: Fecal Occult Bld: POSITIVE — AB

## 2013-08-09 MED ORDER — ONDANSETRON 8 MG/NS 50 ML IVPB
8.0000 mg | Freq: Three times a day (TID) | INTRAVENOUS | Status: DC
  Administered 2013-08-09 – 2013-08-10 (×4): 8 mg via INTRAVENOUS
  Filled 2013-08-09 (×6): qty 8

## 2013-08-09 MED ORDER — LORAZEPAM 2 MG/ML IJ SOLN
0.5000 mg | Freq: Four times a day (QID) | INTRAMUSCULAR | Status: DC | PRN
  Administered 2013-08-09: 0.5 mg via INTRAVENOUS
  Filled 2013-08-09: qty 1

## 2013-08-09 MED ORDER — PANTOPRAZOLE SODIUM 40 MG PO TBEC
40.0000 mg | DELAYED_RELEASE_TABLET | Freq: Two times a day (BID) | ORAL | Status: DC
  Administered 2013-08-09 – 2013-08-10 (×2): 40 mg via ORAL
  Filled 2013-08-09 (×2): qty 1

## 2013-08-09 MED ORDER — ENSURE COMPLETE PO LIQD
237.0000 mL | Freq: Three times a day (TID) | ORAL | Status: DC
  Administered 2013-08-09 – 2013-08-10 (×2): 237 mL via ORAL

## 2013-08-09 MED ORDER — FLUOROURACIL CHEMO INJECTION 5 GM/100ML
200.0000 mg/m2/d | INTRAVENOUS | Status: DC
  Administered 2013-08-09: 2850 mg via INTRAVENOUS
  Filled 2013-08-09: qty 57

## 2013-08-09 MED ORDER — FLUCONAZOLE IN SODIUM CHLORIDE 200-0.9 MG/100ML-% IV SOLN
200.0000 mg | INTRAVENOUS | Status: DC
  Administered 2013-08-09: 200 mg via INTRAVENOUS
  Filled 2013-08-09 (×3): qty 100

## 2013-08-09 MED ORDER — MAGIC MOUTHWASH
10.0000 mL | Freq: Four times a day (QID) | ORAL | Status: DC
  Administered 2013-08-09 – 2013-08-10 (×5): 10 mL via ORAL
  Filled 2013-08-09 (×5): qty 10

## 2013-08-09 NOTE — Progress Notes (Signed)
[name] inpatient and due  for Portacath reaccess and flush and 2fu pump change out. Seen today by T. Jacalyn Lefevre PA and orders received to proceed with treatment. Portacath located lt chest wall accessed with  H 20 needle. Good blood return present. Portacath flushed with 20ml NS and chemo restarted. Procedure without incident. Patient tolerated procedure well.

## 2013-08-09 NOTE — Care Management Note (Addendum)
    Page 1 of 2   08/10/2013     3:34:49 PM   CARE MANAGEMENT NOTE 08/10/2013  Patient:  [name], [name]   Account Number:  [id number]  Date Initiated:  08/09/2013  Documentation initiated by:  Sharrie Rothman  Subjective/Objective Assessment:   Pt admitted from home with anemia. Pt lives with her son and daughter in law and will return home with them at discharge. Pt was just d/c'd from services from [facility].     Action/Plan:   CM will arrange resumption of [facility] at discharge per pts request. Pt receiving IV chemo continuous and is very weak. Will continue to follow for discharge planning needs.   Anticipated DC Date:  08/12/2013   Anticipated DC Plan:  HOME W HOME HEALTH SERVICES      DC Planning Services  CM consult      [facility] Choice  HOME HEALTH   Choice offered to / List presented to:  C-1 Patient        HH arranged  HH-1 RN  HH-2 PT  HH-4 NURSE'S AIDE      HH agency  [facility]   Status of service:  Completed, signed off Medicare Important Message given?  YES (If response is "NO", the following Medicare IM given date fields will be blank) Date Medicare IM given:   Date Additional Medicare IM given:    Discharge Disposition:  HOME W HOME HEALTH SERVICES  Per UR Regulation:    If discussed at Long Length of Stay Meetings, dates discussed:    Comments:  08/10/13 1530 Arlyss Queen, RN BSN CM Pt discharged home today with [facility] RN, PT, and aide. Alroy Bailiff of [facility] is aware and will collect the pts information from the chart. No DME needs noted. HH services to start within 48 hours of discharge. Pt and pts nurse aware of discharge arrangements.  08/09/13 1125 Arlyss Queen, RN BSN CM

## 2013-08-09 NOTE — Progress Notes (Addendum)
Subjective: Thinks black stool is "less" than when first admitted. No nausea currently. Trying to eat breakfast, doesn't have much taste for food. CBC ordered for today.   Objective: Vital signs in last 24 hours: Temp:  [97.7 F (36.5 C)-98.4 F (36.9 C)] 98.4 F (36.9 C) (11/03 0600) Pulse Rate:  [63-76] 76 (11/03 0600) Resp:  [17-18] 18 (11/03 0600) BP: (113-144)/(61-77) 138/77 mmHg (11/03 0600) SpO2:  [100 %] 100 % (11/03 0600) Last BM Date: 08/08/13 General:   Alert and oriented, pleasant Head:  Normocephalic and atraumatic. Eyes:  No icterus, sclera clear. Conjuctiva pink.  Heart:  S1, S2 present Lungs: Clear to auscultation bilaterally Abdomen:  Bowel sounds present, soft, non-tender, non-distended. Obese. Sitting up in chair, limited exam Extremities:  Without clubbing or edema. Neurologic:  Alert and  oriented x4;  grossly normal neurologically. Psych:  Alert and cooperative. Normal mood and affect.  Intake/Output from previous day: 11/02 0701 - 11/03 0700 In: 400 [P.O.:400] Out: -  Intake/Output this shift:    Lab Results:  Recent Labs  08/06/13 1540  08/07/13 0115 08/07/13 1157 08/08/13 0603  WBC 4.0  --   --  6.5 9.1  HGB 7.8*  < > 6.4* 9.9* 10.2*  HCT 24.3*  < > 20.0* 29.8* 30.2*  PLT 316  --   --  199 169  < > = values in this interval not displayed. BMET  Recent Labs  08/06/13 1540 08/07/13 1157  NA 134* 133*  K 4.3 4.1  CL 96 97  CO2 28 31  GLUCOSE 171* 114*  BUN 31* 20  CREATININE 0.89 0.80  CALCIUM 9.3 9.0   LFT  Recent Labs  08/06/13 1540 08/07/13 1157  PROT 6.1 5.7*  ALBUMIN 2.6* 2.5*  AST 63* 70*  ALT 148* 165*  ALKPHOS 60 50  BILITOT 0.3 0.5   PT/INR  Recent Labs  08/06/13 1540  LABPROT 14.4  INR 1.14     Assessment: 72 year old female with stage IV gastric cancer, recent omental patch Oct 1st after perforation, with recent chemo in October, now with presentation of melena and admitting Hgb 6.4. 2 units PRBCs this  admission, with Hgb improving significantly to 10.2. CBC pending for today.  Although she does note some melena, she feels it is improving since admission. Tolerating diet but poor appetite. No concern for frank GI bleed. From a GI standpoint, would offer supportive measures at this time.   Plan: Follow-up on pending CBC BID PPI Soft mechanical diet Supportive measures Add Ensure vanilla to diet tray per patient request  Nira Retort, ANP-BC [facility]    LOS: 3 days    08/09/2013, 8:07 AM    Attending note: Hemoglobin this morning 10.1; Agree with above assessment and plan.

## 2013-08-09 NOTE — Progress Notes (Signed)
UR chart review completed.  

## 2013-08-09 NOTE — Consult Note (Signed)
[facility] Consultation Oncology  Name: [name]      MRN: [id number]    Location: A310/A310-01  Date: 08/09/2013 Time:8:11 AM   REFERRING PHYSICIAN:  Jerald Kief, MD  REASON FOR CONSULT:  Metastatic Gastric Ca   DIAGNOSIS:  Metastatic Gastric Ca, Stage IV (T4b, Nx, M1)  HISTORY OF PRESENT ILLNESS:   [name] is a 72 yo African American woman who is well-known to the [facility] where she started cycle 1 of ECF chemotherapy on 08/02/2013.  She presented to the ED with acute GI blood loss.  Hemoglobin was 6.4 g/dL on 86/02/7845 and she subsequently received PRBC transfusion. She is D8 of cycle 1 of Epirubicin, Cisplatin, and Continuous 5 FU infusion (changed weekly).  This AM she looks ill and fatigued.  She admits to abdominal pain that is mild-moderate without any point tenderness.  She denies any tenderness to palpation.  She admits to nausea and review of her medication list demonstrates sub-optimal anti-emetic regimen.  She is on Reglan and we will continue to aid in gastric emptying.  I will alter below in plan.    She notes a poor taste in mouth and additionally some tenderness.  On physical exam, she is noted to have oral candidiasis.  I provided her education regarding this infection and I will add antifungals to her medication list.  She admits to tenderness with PO pills and therefore I will order IV medications.   I personally reviewed and went over laboratory results with the patient.  Her labs this AM meet treatment parameters for continued 5 FU infusion.  Therefore, we will have one of the [facility] nurses report to 3rd floor for 5 FU bag change and continue with chemotherapy as anticipated.    PAST MEDICAL HISTORY:   Past Medical History  Diagnosis Date  . Hypertension   . Paroxysmal SVT (supraventricular tachycardia)   . Hypercholesteremia   . GERD (gastroesophageal reflux disease)   . PUD (peptic ulcer disease)   . DVT (deep  venous thrombosis) 2007  . Adrenal adenoma     left  . Thyroid nodule     left  . H. pylori infection   . Diverticulosis   . 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 Diarrhea     recurrent  . Arthritis   . Hiatal hernia   . Cancer     adenocarcinoma    ALLERGIES: No Known Allergies    MEDICATIONS: I have reviewed the patient's current medications.     PAST SURGICAL HISTORY Past Surgical History  Procedure Laterality Date  . Total knee arthroplasty  bilateral knee  . Hip  replacement  left  . Appendectomy    . Cholecystectomy    . Abdominal hysterectomy    . Breast lumpectomy      left  . Ablation      of SVT  . Cardiac catheterization  2009    normal coronary arteries  . Colonoscopy  09/13/2009    NGE:[id number] internal hemorrhoids  . Esophagogastroduodenoscopy  09/13/2009    UYQ:[id number]/  . Esophagogastroduodenoscopy N/A 06/11/2013    Procedure: ESOPHAGOGASTRODUODENOSCOPY (EGD);  Surgeon: West Bali, MD;  Location: [facility] ENDO SUITE;  Service: Endoscopy;  Laterality: N/A;  2:15-moved to 1045 [name] notified pt  . Portacath placement Left 07/06/2013    Procedure: INSERTION PORT-A-CATH;  Surgeon: Almond Lint, MD;  Location: [facility] ORS;  Service: General;  Laterality: Left;  .  Laparotomy N/A 07/07/2013    Procedure: EXPLORATORY LAPAROTOMY;  Surgeon: Dalia Heading, MD;  Location: [facility] ORS;  Service: General;  Laterality: N/A;  . Gastrorrhaphy N/A 07/07/2013    Procedure: Ferrel Logan;  Surgeon: Dalia Heading, MD;  Location: [facility] ORS;  Service: General;  Laterality: N/A;    FAMILY HISTORY: Family History  Problem Relation Age of Onset  . Colon cancer Neg Hx   . Cancer Mother     leukemia  . Cancer Sister     breast  . Heart disease Brother   . Heart disease Sister     SOCIAL HISTORY:  reports that she has been smoking.  She has never used smokeless tobacco. She reports that she does not drink alcohol or use illicit  drugs.  PERFORMANCE STATUS: The patient's performance status is 3 - Symptomatic, >50% confined to bed  PHYSICAL EXAM: Most Recent Vital Signs: Blood pressure 138/77, pulse 76, temperature 98.4 F (36.9 C), temperature source Oral, resp. rate 18, height 5\' 6"  (1.676 m), weight 198 lb 10.2 oz (90.1 kg), SpO2 100.00%. General appearance: alert, cooperative, mild distress and ill looking  Head: Normocephalic, without obvious abnormality, atraumatic Eyes: negative findings: lids and lashes normal, conjunctivae and sclerae normal and pupils equal, round, reactive to light and accomodation Throat: abnormal findings: thrush Neck: supple, symmetrical, trachea midline Lungs: clear to auscultation bilaterally Heart: regular rate and rhythm, S1, S2 normal, no murmur, click, rub or gallop Skin: Skin color, texture, turgor normal. No rashes or lesions Neurologic: Grossly normal  LABORATORY DATA:  Results for orders placed during the hospital encounter of 08/06/13 (from the past 48 hour(s))  CBC     Status: Abnormal   Collection Time    08/07/13 11:57 AM      Result Value Range   WBC 6.5  4.0 - 10.5 K/uL   RBC 3.85 (*) 3.87 - 5.11 MIL/uL   Hemoglobin 9.9 (*) 12.0 - 15.0 g/dL   Comment: DELTA CHECK NOTED     POST TRANSFUSION SPECIMEN   HCT 29.8 (*) 36.0 - 46.0 %   MCV 77.4 (*) 78.0 - 100.0 fL   MCH 25.7 (*) 26.0 - 34.0 pg   MCHC 33.2  30.0 - 36.0 g/dL   RDW 16.1 (*) 09.6 - 04.5 %   Platelets 199  150 - 400 K/uL   Comment: DELTA CHECK NOTED     POST TRANSFUSION SPECIMEN  COMPREHENSIVE METABOLIC PANEL     Status: Abnormal   Collection Time    08/07/13 11:57 AM      Result Value Range   Sodium 133 (*) 135 - 145 mEq/L   Potassium 4.1  3.5 - 5.1 mEq/L   Chloride 97  96 - 112 mEq/L   CO2 31  19 - 32 mEq/L   Glucose, Bld 114 (*) 70 - 99 mg/dL   BUN 20  6 - 23 mg/dL   Comment: DELTA CHECK NOTED   Creatinine, Ser 0.80  0.50 - 1.10 mg/dL   Calcium 9.0  8.4 - 40.9 mg/dL   Total Protein 5.7 (*)  6.0 - 8.3 g/dL   Albumin 2.5 (*) 3.5 - 5.2 g/dL   AST 70 (*) 0 - 37 U/L   ALT 165 (*) 0 - 35 U/L   Alkaline Phosphatase 50  39 - 117 U/L   Total Bilirubin 0.5  0.3 - 1.2 mg/dL   GFR calc non Af Amer 71 (*) >90 mL/min   GFR calc Af Amer 83 (*) >90  mL/min   Comment: (NOTE)     The eGFR has been calculated using the CKD EPI equation.     This calculation has not been validated in all clinical situations.     eGFR's persistently <90 mL/min signify possible Chronic Kidney     Disease.  CBC     Status: Abnormal   Collection Time    08/08/13  6:03 AM      Result Value Range   WBC 9.1  4.0 - 10.5 K/uL   Comment: WHITE COUNT CONFIRMED ON SMEAR   RBC 3.91  3.87 - 5.11 MIL/uL   Hemoglobin 10.2 (*) 12.0 - 15.0 g/dL   HCT 95.6 (*) 21.3 - 08.6 %   MCV 77.2 (*) 78.0 - 100.0 fL   MCH 26.1  26.0 - 34.0 pg   MCHC 33.8  30.0 - 36.0 g/dL   RDW 57.8 (*) 46.9 - 62.9 %   Platelets 169  150 - 400 K/uL      RADIOGRAPHY: No results found.     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  Abigail Miyamoto MD  Pathologist, Electronic Signature  (Case signed 07/12/2013)   ASSESSMENT:  1. Anemia secondary to acute GI bleed secondary to malignancy.  GI and Gen Surg following. 2. Oral candidiasis, will treat with IV Diflucan and Magic Mouthwash 3. Nausea without mention of vomiting or heaving, will revamp antiemetic regimen.  Received Aloxi and Emend as pre-medications on 08/02/2013.  4. Stage IV (T4b, Nx, M1) adenocarcinoma of stomach diagnosed via biopsy by Dr. Darrick Penna on 06/11/2013. She presented to [facility] on 07/07/2013 with gastric perforation; operated on and patched by Dr. Lovell Sheehan the same day on 07/07/2013. During surgery, Dr. Lovell Sheehan noted malignant appearing peritoneal implants that were biopsied and positive for metastatic disease.  Started ECF chemotherapy on 08/02/2013.  Day 8, cycle 1 today (08/09/2013).     PLAN:  1. I personally reviewed and went over laboratory results with the patient. 2. D/C Dexamethasone 3. Diflucan 200 mg IV daily beginning today (08/09/2013). 4. Magic Mouthwash QID 5. Zofran 8 mg IV every 8 hours PRN nausea/vomiting 6. Ativan 0.5 mg IV every 6 hours PRN nausea/vomiting 7. Will change 5 FU CI bag today as scheduled as treatment parameters are met this as of yesterday. 8. Will order CBC diff, CMET today in preparation for 5 FU bag change this AM. 9. Will follow along while in the hospital.   All questions were answered. The patient knows to call the clinic with any problems, questions or concerns. We can certainly see the patient much sooner if necessary.  Patient and plan discussed with Dr. Alla German and he is in agreement with the aforementioned.    Asja Frommer

## 2013-08-09 NOTE — Progress Notes (Signed)
[facility] PROGRESS NOTE  [name] ZOX:[id number] DOB: Oct 13, 1940 DOA: 08/06/2013 PCP: Evlyn Courier, MD  Assessment/Plan: Acute blood loss anemia  1. In setting of adenocarcinoma of the stomach while undergoing chemo 2. Will follow CBC and transfuse as needed 3. Pt is s/p 2 units PRBC's 4. Cont on bid protonix IV for now 5. Appreciate Surgery and GI input - follow recs HTN  1. BP stable 2. Cont meds for now Hx of adenocarcinoma  1. Currently undergoing chemo 2. Stage 4 adenocarcinoma of the stomach 3. Due for chemo on 08/09/13 4. Appreciate Oncology recs DVT prophylaxis  1. SCD's  Code Status: DNR - discussed with patient and her family, all are in agreement Family Communication: Pt and family in room Disposition Plan: Pending  Consultants:  GI  Surgery  HPI/Subjective: No acute events. Reports still feeling weak  Objective: Filed Vitals:   08/08/13 0620 08/08/13 1312 08/08/13 2111 08/09/13 0600  BP: 131/67 113/61 144/73 138/77  Pulse: 67 68 63 76  Temp: 97.6 F (36.4 C) 98.3 F (36.8 C) 97.7 F (36.5 C) 98.4 F (36.9 C)  TempSrc: Oral Oral Oral Oral  Resp: 18 17 18 18   Height:      Weight:      SpO2: 99% 100% 100% 100%    Intake/Output Summary (Last 24 hours) at 08/09/13 0751 Last data filed at 08/08/13 1803  Gross per 24 hour  Intake    400 ml  Output      0 ml  Net    400 ml   Filed Weights   08/06/13 1510 08/06/13 1827  Weight: 88.905 kg (196 lb) 90.1 kg (198 lb 10.2 oz)    Exam:  General:  Awake, in nad  Cardiovascular: regular, s1, s2  Respiratory: normal resp effort, no wheezing  Abdomen: soft, nondistended  Musculoskeletal: perfused, no clubbing   Data Reviewed: Basic Metabolic Panel:  Recent Labs Lab 08/02/13 0826 08/06/13 1540 08/07/13 1157  NA 140 134* 133*  K 3.8 4.3 4.1  CL 103 96 97  CO2 31 28 31   GLUCOSE 117* 171* 114*  BUN 8 31* 20  CREATININE 0.71 0.89 0.80  CALCIUM 9.2 9.3 9.0  MG 2.1  --   --     Liver Function Tests:  Recent Labs Lab 08/02/13 0826 08/06/13 1540 08/07/13 1157  AST 32 63* 70*  ALT 54* 148* 165*  ALKPHOS 75 60 50  BILITOT 0.3 0.3 0.5  PROT 6.7 6.1 5.7*  ALBUMIN 2.8* 2.6* 2.5*    Recent Labs Lab 08/06/13 1540  LIPASE 21   No results found for this basename: AMMONIA,  in the last 168 hours CBC:  Recent Labs Lab 08/02/13 0826 08/04/13 0222 08/06/13 1540 08/06/13 1832 08/06/13 2228 08/07/13 0115 08/07/13 1157 08/08/13 0603  WBC 6.7 5.1 4.0  --   --   --  6.5 9.1  NEUTROABS 4.9  --  3.4  --   --   --   --   --   HGB 8.8* 9.4* 7.8* 7.3* 7.1* 6.4* 9.9* 10.2*  HCT 28.4* 29.0* 24.3* 23.0* 22.3* 20.0* 29.8* 30.2*  MCV 77.0* 74.6* 74.3*  --   --   --  77.4* 77.2*  PLT 372 344 316  --   --   --  199 169   Cardiac Enzymes:  Recent Labs Lab 08/06/13 1540  TROPONINI <0.30   BNP (last 3 results) No results found for this basename: PROBNP,  in the last 8760 hours  CBG: No results found for this basename: GLUCAP,  in the last 168 hours  Recent Results (from the past 240 hour(s))  URINE CULTURE     Status: None   Collection Time    07/31/13  5:52 AM      Result Value Range Status   Specimen Description URINE, CLEAN CATCH   Final   Special Requests NONE   Final   Culture  Setup Time     Final   Value: 07/31/2013 23:37     Performed at [facility] Count     Final   Value: 5,000 COLONIES/ML     Performed at [facility]   Culture     Final   Value: INSIGNIFICANT GROWTH     Performed at [facility]   Report Status 08/02/2013 FINAL   Final     Studies: No results found.  Scheduled Meds: . atorvastatin  20 mg Oral q1800  . dexamethasone  4 mg Oral See admin instructions  . diltiazem  360 mg Oral Daily  . docusate sodium  100 mg Oral BID  . Linaclotide  145 mcg Oral Daily  . metoCLOPramide  10 mg Oral QID  . metoprolol succinate  75 mg Oral Daily  . omega-3 acid ethyl esters  1 g Oral Daily  .  pantoprazole (PROTONIX) IV  40 mg Intravenous Q12H   Continuous Infusions: . sodium chloride 100 mL/hr at 08/06/13 1650  . sodium chloride 75 mL/hr at 08/08/13 1951    Principal Problem:   Acute blood loss anemia Active Problems:   HYPERTENSION   GASTRIC ULCER, HX OF   Adenocarcinoma of stomach  Time spent:  Jaydenn Boccio K  [facility] Pager [phone]. If 7PM-7AM, please contact night-coverage at [email or web address], password [facility] 08/09/2013, 7:51 AM  LOS: 3 days

## 2013-08-10 ENCOUNTER — Encounter (INDEPENDENT_AMBULATORY_CARE_PROVIDER_SITE_OTHER): Payer: Self-pay | Admitting: General Surgery

## 2013-08-10 DIAGNOSIS — F17200 Nicotine dependence, unspecified, uncomplicated: Secondary | ICD-10-CM

## 2013-08-10 DIAGNOSIS — C786 Secondary malignant neoplasm of retroperitoneum and peritoneum: Secondary | ICD-10-CM

## 2013-08-10 DIAGNOSIS — K922 Gastrointestinal hemorrhage, unspecified: Secondary | ICD-10-CM

## 2013-08-10 DIAGNOSIS — R112 Nausea with vomiting, unspecified: Secondary | ICD-10-CM

## 2013-08-10 LAB — COMPREHENSIVE METABOLIC PANEL
ALT: 299 U/L — ABNORMAL HIGH (ref 0–35)
AST: 89 U/L — ABNORMAL HIGH (ref 0–37)
Albumin: 2.4 g/dL — ABNORMAL LOW (ref 3.5–5.2)
BUN: 5 mg/dL — ABNORMAL LOW (ref 6–23)
Calcium: 8.8 mg/dL (ref 8.4–10.5)
Chloride: 99 mEq/L (ref 96–112)
Creatinine, Ser: 0.68 mg/dL (ref 0.50–1.10)
Sodium: 135 mEq/L (ref 135–145)
Total Bilirubin: 0.3 mg/dL (ref 0.3–1.2)
Total Protein: 5.8 g/dL — ABNORMAL LOW (ref 6.0–8.3)

## 2013-08-10 LAB — CBC WITH DIFFERENTIAL/PLATELET
Basophils Absolute: 0 10*3/uL (ref 0.0–0.1)
Basophils Relative: 0 % (ref 0–1)
Eosinophils Absolute: 0 10*3/uL (ref 0.0–0.7)
HCT: 29.3 % — ABNORMAL LOW (ref 36.0–46.0)
MCH: 25.5 pg — ABNORMAL LOW (ref 26.0–34.0)
MCHC: 32.8 g/dL (ref 30.0–36.0)
Monocytes Absolute: 0.1 10*3/uL (ref 0.1–1.0)
Monocytes Relative: 1 % — ABNORMAL LOW (ref 3–12)
Neutro Abs: 4.8 10*3/uL (ref 1.7–7.7)
RDW: 17 % — ABNORMAL HIGH (ref 11.5–15.5)
WBC: 5.9 10*3/uL (ref 4.0–10.5)

## 2013-08-10 MED ORDER — FLUCONAZOLE 100 MG PO TABS: 100.0000 mg | ORAL_TABLET | Freq: Every day | ORAL | Status: AC

## 2013-08-10 MED ORDER — METOPROLOL SUCCINATE ER 25 MG PO TB24: 75.0000 mg | ORAL_TABLET | Freq: Every day | ORAL | Status: AC

## 2013-08-10 MED ORDER — FLUCONAZOLE 100 MG PO TABS
100.0000 mg | ORAL_TABLET | Freq: Every day | ORAL | Status: DC
  Administered 2013-08-10: 100 mg via ORAL
  Filled 2013-08-10: qty 1

## 2013-08-10 MED ORDER — ONDANSETRON HCL 8 MG PO TABS: 8.0000 mg | ORAL_TABLET | Freq: Three times a day (TID) | ORAL | Status: AC | PRN

## 2013-08-10 MED ORDER — LORAZEPAM 0.5 MG PO TABS: 0.5000 mg | ORAL_TABLET | Freq: Four times a day (QID) | ORAL | Status: AC | PRN

## 2013-08-10 MED ORDER — LORAZEPAM 0.5 MG PO TABS: 0.5000 mg | ORAL_TABLET | Freq: Four times a day (QID) | ORAL | Status: DC | PRN

## 2013-08-10 MED ORDER — ONDANSETRON HCL 4 MG PO TABS: 8.0000 mg | ORAL_TABLET | Freq: Three times a day (TID) | ORAL | Status: DC | PRN

## 2013-08-10 NOTE — Progress Notes (Addendum)
Subjective: Since I last evaluated the patient her stools are no longer black. Pt would like  A referral to [facility] FOR SECOND LOOK AT HER CASE. Nor brprp or hematemesis.   Objective: Vital signs in last 24 hours: Temp:  [98 F (36.7 C)] 98 F (36.7 C) (11/04 0600) Pulse Rate:  [65-75] 71 (11/04 0600) Resp:  [18-20] 20 (11/04 0600) BP: (127-141)/(56-75) 127/56 mmHg (11/04 0600) SpO2:  [98 %-100 %] 98 % (11/04 0600) Last BM Date: 08/08/13  Intake/Output from previous day: 11/03 0701 - 11/04 0700 In: 1780 [P.O.:840; I.V.:740; IV Piggyback:200] Out: 551 [Urine:550; Stool:1] Intake/Output this shift:    General appearance: alert, cooperative and no distress Resp: clear to auscultation bilaterally Cardio: regular rate and rhythm GI: soft, non-tender; bowel sounds normal  Lab Results:  Recent Labs  08/08/13 0603 08/09/13 0924 08/10/13 0828  WBC 9.1 8.6 5.9  HGB 10.2* 10.1* 9.6*  HCT 30.2* 30.6* 29.3*  PLT 169 143* 134*   BMET  Recent Labs  08/07/13 1157 08/09/13 0924  NA 133* 135  K 4.1 3.7  CL 97 96  CO2 31 30  GLUCOSE 114* 154*  BUN 20 7  CREATININE 0.80 0.64  CALCIUM 9.0 9.0   LFT  Recent Labs  08/09/13 0924  PROT 5.9*  ALBUMIN 2.4*  AST 118*  ALT 310*  ALKPHOS 59  BILITOT 0.4   Medications: I have reviewed the patient's current medications.  Assessment/Plan: Admitted with gi bleed after starting chemo. Hb relatively stable. No melena/BRBPR  PLAN: 1. I SPOKE TO [facility](DR. BRUGGNEN) WHO FELT APC MAY NOT ADDRESS OOZING FROM TUMOR. SUGGESTED IR IF PT HAS FRANK BLEEDING 2. BID PPI 3. SOFT MECH DIET 4. REFER TO [facility] FOR SECOND OPINION. PT WOULD LIKE TO KNOW/EXHAUST ALL OPTIONS FOR TREATMENT. 5. SPOKE WITH DR. Lovell Sheehan. NO SURGICAL INTERVENTION WARRANTED.     LOS: 4 days   [facility] 08/10/2013, 8:46 AM

## 2013-08-10 NOTE — Progress Notes (Signed)
Subjective: In bed with family member at bedside.  Feeling better, but still appearing fatigued.   Nausea is much improved.  Mouth symptoms are improved as well.   Denies any pain in abdomen.  Notes left knee pain that is worse when she starts walking and improves with exercise.  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  Objective: Vital signs in last 24 hours: Temp:  [98 F (36.7 C)] 98 F (36.7 C) (11/04 0600) Pulse Rate:  [65-75] 71 (11/04 0600) Resp:  [18-20] 20 (11/04 0600) BP: (127-141)/(56-75) 127/56 mmHg (11/04 0600) SpO2:  [98 %-100 %] 98 % (11/04 0600)  Intake/Output from previous day: 11/03 0800 - 11/04 0759 In: 1780 [P.O.:840; I.V.:740; IV Piggyback:200] Out: 551 [Urine:550; Stool:1] Intake/Output this shift: Total I/O In: 240 [P.O.:240] Out: -   General appearance: alert, appears stated age, fatigued and no distress Cardio: regular rate and rhythm, S1, S2 normal, no murmur, click, rub or gallop GI: soft, non-tender; bowel sounds normal; no masses,  no organomegaly Extremities: extremities normal, atraumatic, no cyanosis or edema and left knee arthroplasty Oropharynx: Near resolution of oral candidiasis  Lab Results:   Recent Labs  08/09/13 0924 08/10/13 0828  WBC 8.6 5.9  HGB 10.1* 9.6*  HCT 30.6* 29.3*  PLT 143* 134*   BMET  Recent Labs  08/09/13 0924 08/10/13 0828  NA 135 135  K 3.7 3.4*  CL 96 99  CO2 30 30  GLUCOSE 154* 118*  BUN 7 5*  CREATININE 0.64 0.68  CALCIUM 9.0 8.8    Studies/Results: No results found.  Medications: I have reviewed the patient's current medications.  Assessment/Plan: 1. Oral candidiasis, on 200 mg IV Diflucan, will change to 100 mg PO daily.  Improving with near resolution.  Continue as an outpatient until seen on Monday 11/10 2. Anemia, stable.  Secondary to acute GI blood loss.  GI following and recommend supportive care. 3. Nausea/Vomiting, improved with Zofran 8 mg IV every 8 hours and Ativan 0.5  mg IV every 6 hours PRN.  Will transition to PO 4. Stage IV (T4b, Nx, M1) adenocarcinoma of stomach diagnosed via biopsy by Dr. Darrick Penna on 06/11/2013. She presented to [facility] on 07/07/2013 with gastric perforation; operated on and patched by Dr. Lovell Sheehan the same day on 07/07/2013. During surgery, Dr. Lovell Sheehan noted malignant appearing peritoneal implants that were biopsied and positive for metastatic disease. Started ECF chemotherapy on 08/02/2013. Day 9, cycle 1 today (08/09/2013).  5. Transaminitis, improving. 6. From oncology standpoint, patient is stable to go home, but will defer to attending.  7. Will follow-up as an outpatient on Monday.  Patient and plan discussed with Dr. Alla German and he is in agreement with the aforementioned.     LOS: 4 days    Stryker Veasey 08/10/2013

## 2013-08-10 NOTE — Discharge Summary (Signed)
Physician Discharge Summary  [name] ZOX:[id number] DOB: 01-04-1941 DOA: 08/06/2013  PCP: Evlyn Courier, MD  Admit date: 08/06/2013 Discharge date: 08/10/2013  Time spent: 35 minutes  Recommendations for Outpatient Follow-up:  1. Follow up with PCP in 1-2 weeks 2. Follow up with Oncology as scheduled on Monday (08/16/13) 3. Follow up CBC in 1 week  Discharge Diagnoses:  Principal Problem:   Acute blood loss anemia Active Problems:   HYPERTENSION   GASTRIC ULCER, HX OF   Adenocarcinoma of stomach   Discharge Condition: Stable, improved  Diet recommendation: Dysphagia 3 with thin liquids  Filed Weights   08/06/13 1510 08/06/13 1827  Weight: 88.905 kg (196 lb) 90.1 kg (198 lb 10.2 oz)    History of present illness:  [name] is a 72 y.o. female  With a hx of recently diagnosed adenocarcinoma of the stomach who presents to the ED with complaints of BRBPR as well as black tarry stools. The patient is currently undergoing chemo and recently had an exploratory laparotomy and patching of gastric perforation by Dr. Lovell Sheehan. In the ED, the patient was found to have a presenting hgb of 7.8 (was 8.8 four days prior). The hospitalist was consulted for admission.  Hospital Course:  Acute blood loss anemia  1. In setting of adenocarcinoma of the stomach while undergoing chemo 2. Will follow CBC and transfuse as needed 3. Pt is s/p 2 units PRBC's 4. Cont on bid protonix for now 5. Appreciate Surgery and GI input - follow recs 6. No intervention recommended at this time 7. Hgb has remained stable HTN  1. BP stable 2. Cont meds for now Hx of adenocarcinoma  1. Currently undergoing chemo 2. Stage 4 adenocarcinoma of the stomach 3. Due for chemo on 08/09/13 4. Appreciate Oncology recs 5. Pt to be followed up by Oncology on 08/16/13 6. Of note, pt reports considering a second oncology opinion 7. Pt recommended to discuss this with her Oncologist for possible referral for  second opinion DVT prophylaxis  1. SCD's  Consultations:  General Surgery  Oncology  Gastroenterology  Discharge Exam: Filed Vitals:   08/09/13 1050 08/09/13 1453 08/09/13 2300 08/10/13 0600  BP:  135/75 141/75 127/56  Pulse: 74 75 65 71  Temp:  98 F (36.7 C) 98 F (36.7 C) 98 F (36.7 C)  TempSrc:  Oral    Resp:  20 18 20   Height:      Weight:      SpO2:  100% 100% 98%    General: Awake, in nad Cardiovascular: regular, s1, s2 Respiratory: normal resp effort, no wheezing  Discharge Instructions       Future Appointments Provider Department Dept Phone   08/16/2013 2:30 PM [facility] Covering Provider [facility] [phone]   08/16/2013 2:45 PM [facility] Team A ANNIE [facility] [phone]   08/23/2013 8:45 AM [facility] Team A [place] [facility] [phone]   08/30/2013 2:45 PM [facility] Team A [place] [facility] [phone]   09/06/2013 2:45 PM [facility] Team A [place] [facility] [phone]       Medication List         dexamethasone 4 MG tablet  Commonly known as:  DECADRON  Take 4 mg by mouth See admin instructions.     dicyclomine 20 MG tablet  Commonly known as:  BENTYL  Take 20 mg by mouth 2 (two) times daily at 8 am and 10 pm.     diltiazem 360 MG 24  hr capsule  Commonly known as:  TIAZAC  Take 360 mg by mouth every morning.     fish oil-omega-3 fatty acids 1000 MG capsule  Take 1 g by mouth daily.     fluconazole 100 MG tablet  Commonly known as:  DIFLUCAN  Take 1 tablet (100 mg total) by mouth daily.     lidocaine-prilocaine cream  Commonly known as:  EMLA  Apply 1 application topically as needed (for port access).     LINZESS 145 MCG Caps capsule  Generic drug:  Linaclotide  Take 145 mcg by mouth daily.     LORazepam 0.5 MG tablet  Commonly known as:  ATIVAN  Take 1 tablet (0.5 mg total) by mouth every 6 (six) hours as needed (nausea/vomiting).     lovastatin 40 MG tablet  Commonly known  as:  MEVACOR  Take 2 tablets (80 mg total) by mouth at bedtime.     magnesium hydroxide 400 MG/5ML suspension  Commonly known as:  MILK OF MAGNESIA  Take 30 mLs by mouth daily as needed for constipation.     metoCLOPramide 10 MG tablet  Commonly known as:  REGLAN  Take 1 tablet (10 mg total) by mouth 4 (four) times daily.     metoprolol succinate 25 MG 24 hr tablet  Commonly known as:  TOPROL-XL  Take 3 tablets (75 mg total) by mouth daily. Take with or immediately following a meal.     ondansetron 8 MG tablet  Commonly known as:  ZOFRAN  Take 1 tablet (8 mg total) by mouth every 8 (eight) hours as needed for nausea or vomiting.     oxyCODONE 5 MG immediate release tablet  Commonly known as:  Oxy IR/ROXICODONE  Take 1 tablet (5 mg total) by mouth every 4 (four) hours as needed for pain.     pantoprazole 40 MG tablet  Commonly known as:  PROTONIX  Take 1 tablet (40 mg total) by mouth 2 (two) times daily.     PROBIOTIC FORMULA PO  Take 1 tablet by mouth every morning.     prochlorperazine 10 MG tablet  Commonly known as:  COMPAZINE  Take 1 tablet (10 mg total) by mouth every 6 (six) hours.     STOOL SOFTENER PO  Take 1 capsule by mouth 2 (two) times daily.     temazepam 7.5 MG capsule  Commonly known as:  RESTORIL  Take 7.5 mg by mouth at bedtime as needed for sleep.       No Known Allergies Follow-up Information   Follow up with HILL,GERALD K, MD. Schedule an appointment as soon as possible for a visit in 1 week.   Specialty:  Family Medicine   Contact information:   [address] [place] [address] [place] [address] [phone]       Follow up with [facility] On 08/16/2013. (Appt made)    Contact information:   [address] [place] [address]        The results of significant diagnostics from this hospitalization (including imaging, microbiology, ancillary and laboratory) are listed below for reference.    Significant  Diagnostic Studies: Ct Head Wo Contrast  08/04/2013   CLINICAL DATA:  Nausea and headache.  On chemotherapy.  EXAM: CT HEAD WITHOUT CONTRAST  TECHNIQUE: Contiguous axial images were obtained from the base of the skull through the vertex without intravenous contrast.  COMPARISON:  None available for comparison at time of study interpretation.  FINDINGS: The  ventricles and sulci are normal for age. No intraparenchymal hemorrhage, mass effect nor midline shift. Patchy supratentorial white matter hypodensities are within normal range for patient's age and though non-specific suggest sequelae of chronic small vessel ischemic disease. No acute large vascular territory infarcts.  No abnormal extra-axial fluid collections. Basal cisterns are patent. Moderate calcific atherosclerosis of the carotid siphons.  No skull fracture. Visualized paranasal sinuses and mastoid aircells are well-aerated. The included ocular globes and orbital contents are non-suspicious.  IMPRESSION: No acute intracranial process ; normal noncontrast CT of the head for age.   Electronically Signed   By: Awilda Metro   On: 08/04/2013 03:04   Dg Abd Acute W/chest  08/06/2013   CLINICAL DATA:  History of bleeding ulcer  EXAM: ACUTE ABDOMEN SERIES (ABDOMEN 2 VIEW & CHEST 1 VIEW)  COMPARISON:  None.  FINDINGS: Cardiac shadow is within normal limits. A left-sided chest wall port is seen with the catheter tip in the proximal superior vena cava. Lungs are clear bilaterally.  The abdomen shows a nonobstructive bowel gas pattern. Degenerative changes of the lumbar spine are seen. No abnormal mass or abnormal calcifications are noted. A left hip replacement is seen.  IMPRESSION: No acute abnormality noted.   Electronically Signed   By: Alcide Clever M.D.   On: 08/06/2013 16:43   Dg Kayleen Memos W/water Sol Cm  07/12/2013   CLINICAL DATA:  Gastric cancer, large anterior wall gastric perforation through tumor, post omental patching on 07/07/2013 question leak   EXAM: ESOPHAGUS/BARIUM SWALLOW/TABLET STUDY  TECHNIQUE: After obtaining a scout radiograph a routine upper GI series was performed using water-soluble contrast material (150 mL of Omnipaque 350) through patient's indwelling nasogastric tube.  COMPARISON:  CT abdomen 07/07/2013  FLUOROSCOPY TIME:  3 minutes 6 seconds  FINDINGS: Jackson-Pratt drain identified anterior to the stomach/repair.  Imaging was performed in the supine, bilateral lateral positions, bilateral posterior oblique positions and mild anterior oblique positions bilaterally.  Irregularity of the gastric wall is seen at the proximal stomach consistent with tumor and surgery.  No gastric outlet obstruction.  No extravasation of contrast from the stomach is identified.  Specifically no contrast is seen anterior to the stomach on lateral/ anterior oblique views and the JP drain does not opacify with contrast.  Small amount of gastroesophageal reflux is seen around the nasogastric tube.  IMPRESSION: Wall irregularity at the proximal stomach consistent with tumor and surgery.  No contrast extravasation identified to suggest continued leak at the site of perforation/repair.  Findings discussed with Dr. Lovell Sheehan prior to dictation of this report.   Electronically Signed   By: Ulyses Southward M.D.   On: 07/12/2013 12:16    Microbiology: No results found for this or any previous visit (from the past 240 hour(s)).   Labs: Basic Metabolic Panel:  Recent Labs Lab 08/06/13 1540 08/07/13 1157 08/09/13 0924 08/10/13 0828  NA 134* 133* 135 135  K 4.3 4.1 3.7 3.4*  CL 96 97 96 99  CO2 28 31 30 30   GLUCOSE 171* 114* 154* 118*  BUN 31* 20 7 5*  CREATININE 0.89 0.80 0.64 0.68  CALCIUM 9.3 9.0 9.0 8.8   Liver Function Tests:  Recent Labs Lab 08/06/13 1540 08/07/13 1157 08/09/13 0924 08/10/13 0828  AST 63* 70* 118* 89*  ALT 148* 165* 310* 299*  ALKPHOS 60 50 59 59  BILITOT 0.3 0.5 0.4 0.3  PROT 6.1 5.7* 5.9* 5.8*  ALBUMIN 2.6* 2.5* 2.4* 2.4*     Recent Labs  Lab 08/06/13 1540  LIPASE 21   No results found for this basename: AMMONIA,  in the last 168 hours CBC:  Recent Labs Lab 08/06/13 1540  08/07/13 0115 08/07/13 1157 08/08/13 0603 08/09/13 0924 08/10/13 0828  WBC 4.0  --   --  6.5 9.1 8.6 5.9  NEUTROABS 3.4  --   --   --   --  7.9* 4.8  HGB 7.8*  < > 6.4* 9.9* 10.2* 10.1* 9.6*  HCT 24.3*  < > 20.0* 29.8* 30.2* 30.6* 29.3*  MCV 74.3*  --   --  77.4* 77.2* 77.7* 77.7*  PLT 316  --   --  199 169 143* 134*  < > = values in this interval not displayed. Cardiac Enzymes:  Recent Labs Lab 08/06/13 1540  TROPONINI <0.30   BNP: BNP (last 3 results) No results found for this basename: PROBNP,  in the last 8760 hours CBG: No results found for this basename: GLUCAP,  in the last 168 hours     Signed:  Ronnetta Currington K  [facility] 08/10/2013, 1:34 PM

## 2013-08-10 NOTE — Evaluation (Signed)
Physical Therapy Evaluation Patient Details Name: [name] MRN: [id number] DOB: [birth year] Today's Date: 08/10/2013 Time: 4098-1191 PT Time Calculation (min): 43 min  PT Assessment / Plan / Recommendation History of Present Illness   Pt with Stage IV stomach CA was admitted with acute blood loss anemia.  She has been receiving chemotherapy via pump infusion and living with her children.  She has been fairly weak at home, spending some time in bed and other time up in a chair.  She has had bilateral TKR and left THR and has intermittent chronic left knee pain.  She has used a walker in the past after these procedures.  Today, she reports no pain.  Clinical Impression   Pt was seen for evaluation and found to be close to prior functional level.  She was alert and oriented and very cooperative.  She was very willing to increase her activity level with me.  I did recommend that she use her walker now due to weakness that comes with chronic illness as well as intermittent left knee pain.  She feels much more secure ambulating with a walker and will use hers at home.  She understands proper use of the walker.    PT Assessment  Patent does not need any further PT services    Follow Up Recommendations  No PT follow up    Does the patient have the potential to tolerate intense rehabilitation      Barriers to Discharge        Equipment Recommendations  None recommended by PT    Recommendations for Other Services     Frequency      Precautions / Restrictions Precautions Precautions: None Restrictions Weight Bearing Restrictions: No   Pertinent Vitals/Pain       Mobility  Bed Mobility Bed Mobility: Sit to Supine Sit to Supine: 4: Min assist Details for Bed Mobility Assistance: needs min assist to lift LLE into the bed Transfers Transfers: Sit to Stand;Stand to Sit Sit to Stand: 6: Modified independent (Device/Increase time);From chair/3-in-1;With upper extremity assist Stand  to Sit: 6: Modified independent (Device/Increase time);To chair/3-in-1;To bed;With upper extremity assist Ambulation/Gait Ambulation/Gait Assistance: 5: Supervision Ambulation Distance (Feet): 150 Feet (total-3 seated rest breaks) Assistive device: Rolling walker Ambulation/Gait Assistance Details: pt is able to ambulate with no assistive device but she is much more comfortable using a walker Gait Pattern: Within Functional Limits Gait velocity: WNL Stairs: No Wheelchair Mobility Wheelchair Mobility: No    Exercises General Exercises - Lower Extremity Ankle Circles/Pumps: AROM;Both;10 reps;Seated Long Arc Quad: AROM;Both;10 reps;Seated Hip Flexion/Marching: AROM;Both;20 reps;Seated   PT Diagnosis:    PT Problem List:   PT Treatment Interventions:       PT Goals(Current goals can be found in the care plan section) Acute Rehab PT Goals PT Goal Formulation: No goals set, d/c therapy  Visit Information  Last PT Received On: 08/10/13       Prior Functioning  Home Living Family/patient expects to be discharged to:: Private residence Living Arrangements: Children Available Help at Discharge: Family;Available 24 hours/day Type of Home: House Home Access: Level entry Home Layout: One level Home Equipment: Walker - 2 wheels;Cane - single point;Shower seat Prior Function Level of Independence: Needs assistance Gait / Transfers Assistance Needed: ambulates with no assistive device for short distances ADL's / Homemaking Assistance Needed: needs assist with sponge bath and dressing Communication Communication: No difficulties    Cognition  Cognition Arousal/Alertness: Awake/alert Behavior During Therapy: WFL for tasks assessed/performed Overall Cognitive  Status: Within Functional Limits for tasks assessed    Extremity/Trunk Assessment Lower Extremity Assessment Lower Extremity Assessment: Overall WFL for tasks assessed   Balance    End of Session PT - End of  Session Equipment Utilized During Treatment: Gait belt Activity Tolerance: Patient tolerated treatment well Patient left: in bed;with call bell/phone within reach;with bed alarm set  GP     Konrad Penta 08/10/2013, 1:49 PM

## 2013-08-11 ENCOUNTER — Inpatient Hospital Stay (HOSPITAL_COMMUNITY): Payer: Medicare Other

## 2013-08-11 NOTE — Progress Notes (Signed)
Discharge instructions and prescriptions given, verbalized understanding, out in stable condition with staff via w/c. 

## 2013-08-13 ENCOUNTER — Other Ambulatory Visit (HOSPITAL_COMMUNITY): Payer: Self-pay | Admitting: Oncology

## 2013-08-13 DIAGNOSIS — C169 Malignant neoplasm of stomach, unspecified: Secondary | ICD-10-CM

## 2013-08-13 DIAGNOSIS — D3501 Benign neoplasm of right adrenal gland: Secondary | ICD-10-CM

## 2013-08-13 MED ORDER — PROCHLORPERAZINE MALEATE 10 MG PO TABS: 10.0000 mg | ORAL_TABLET | Freq: Four times a day (QID) | ORAL | Status: AC

## 2013-08-16 ENCOUNTER — Encounter (HOSPITAL_COMMUNITY): Payer: Medicare Other

## 2013-08-16 ENCOUNTER — Inpatient Hospital Stay (HOSPITAL_COMMUNITY): Payer: Medicare Other

## 2013-08-16 ENCOUNTER — Encounter (HOSPITAL_COMMUNITY): Payer: Medicare Other | Attending: Hematology and Oncology

## 2013-08-16 ENCOUNTER — Other Ambulatory Visit (HOSPITAL_COMMUNITY): Payer: Self-pay | Admitting: Oncology

## 2013-08-16 DIAGNOSIS — D62 Acute posthemorrhagic anemia: Secondary | ICD-10-CM

## 2013-08-16 DIAGNOSIS — G609 Hereditary and idiopathic neuropathy, unspecified: Secondary | ICD-10-CM | POA: Insufficient documentation

## 2013-08-16 DIAGNOSIS — R300 Dysuria: Secondary | ICD-10-CM | POA: Insufficient documentation

## 2013-08-16 DIAGNOSIS — I10 Essential (primary) hypertension: Secondary | ICD-10-CM | POA: Insufficient documentation

## 2013-08-16 DIAGNOSIS — E663 Overweight: Secondary | ICD-10-CM | POA: Insufficient documentation

## 2013-08-16 DIAGNOSIS — K219 Gastro-esophageal reflux disease without esophagitis: Secondary | ICD-10-CM | POA: Insufficient documentation

## 2013-08-16 DIAGNOSIS — C169 Malignant neoplasm of stomach, unspecified: Secondary | ICD-10-CM | POA: Insufficient documentation

## 2013-08-16 LAB — CBC WITH DIFFERENTIAL/PLATELET
Eosinophils Absolute: 0 10*3/uL (ref 0.0–0.7)
Eosinophils Relative: 0 % (ref 0–5)
HCT: 27.7 % — ABNORMAL LOW (ref 36.0–46.0)
Hemoglobin: 9 g/dL — ABNORMAL LOW (ref 12.0–15.0)
Lymphocytes Relative: 18 % (ref 12–46)
Lymphs Abs: 0.5 10*3/uL — ABNORMAL LOW (ref 0.7–4.0)
MCH: 25.3 pg — ABNORMAL LOW (ref 26.0–34.0)
MCV: 77.8 fL — ABNORMAL LOW (ref 78.0–100.0)
Monocytes Relative: 8 % (ref 3–12)
Platelets: 300 10*3/uL (ref 150–400)
RBC: 3.56 MIL/uL — ABNORMAL LOW (ref 3.87–5.11)
WBC: 2.9 10*3/uL — ABNORMAL LOW (ref 4.0–10.5)

## 2013-08-16 MED ORDER — SODIUM CHLORIDE 0.9 % IJ SOLN: 10.0000 mL | INTRAMUSCULAR | Status: DC | PRN

## 2013-08-16 MED ORDER — HEPARIN SOD (PORK) LOCK FLUSH 100 UNIT/ML IV SOLN: 500.0000 [IU] | Freq: Once | INTRAVENOUS | Status: DC | PRN

## 2013-08-16 MED ORDER — SODIUM CHLORIDE 0.9 % IJ SOLN
10.0000 mL | INTRAMUSCULAR | Status: DC | PRN
  Administered 2013-08-16: 10 mL via INTRAVENOUS

## 2013-08-16 MED ORDER — SODIUM CHLORIDE 0.9 % IV SOLN
200.0000 mg/m2/d | INTRAVENOUS | Status: DC
  Administered 2013-08-16: 2850 mg via INTRAVENOUS
  Filled 2013-08-16: qty 50

## 2013-08-16 MED ORDER — HEPARIN SOD (PORK) LOCK FLUSH 100 UNIT/ML IV SOLN: 500.0000 [IU] | Freq: Once | INTRAVENOUS | Status: DC

## 2013-08-16 NOTE — Progress Notes (Signed)
Port d/c .  Area cleansed with soap and water per pt request and allowed to dry after toweling off  Site accessed with sterile port and dressed with transparent drsg and antimicrobial disk.  81fu for continous  Infusion restarted.  Pt tolerated all well.     [name]

## 2013-08-18 ENCOUNTER — Emergency Department (HOSPITAL_COMMUNITY): Payer: Medicare Other

## 2013-08-18 ENCOUNTER — Encounter (HOSPITAL_COMMUNITY): Payer: Self-pay | Admitting: Emergency Medicine

## 2013-08-18 ENCOUNTER — Other Ambulatory Visit: Payer: Self-pay

## 2013-08-18 ENCOUNTER — Telehealth: Payer: Self-pay | Admitting: Gastroenterology

## 2013-08-18 ENCOUNTER — Inpatient Hospital Stay (HOSPITAL_COMMUNITY)
Admission: EM | Admit: 2013-08-18 | Discharge: 2013-08-23 | DRG: 167 | Disposition: A | Discharge status: Home Health | Payer: Medicare Other | Attending: Family Medicine | Admitting: Family Medicine

## 2013-08-18 DIAGNOSIS — I10 Essential (primary) hypertension: Secondary | ICD-10-CM | POA: Diagnosis present

## 2013-08-18 DIAGNOSIS — Z9221 Personal history of antineoplastic chemotherapy: Secondary | ICD-10-CM

## 2013-08-18 DIAGNOSIS — Z86718 Personal history of other venous thrombosis and embolism: Secondary | ICD-10-CM

## 2013-08-18 DIAGNOSIS — I2699 Other pulmonary embolism without acute cor pulmonale: Secondary | ICD-10-CM | POA: Diagnosis present

## 2013-08-18 DIAGNOSIS — E7800 Pure hypercholesterolemia, unspecified: Secondary | ICD-10-CM | POA: Diagnosis present

## 2013-08-18 DIAGNOSIS — Z8711 Personal history of peptic ulcer disease: Secondary | ICD-10-CM

## 2013-08-18 DIAGNOSIS — Z87891 Personal history of nicotine dependence: Secondary | ICD-10-CM

## 2013-08-18 DIAGNOSIS — M129 Arthropathy, unspecified: Secondary | ICD-10-CM | POA: Diagnosis present

## 2013-08-18 DIAGNOSIS — C169 Malignant neoplasm of stomach, unspecified: Secondary | ICD-10-CM | POA: Diagnosis present

## 2013-08-18 DIAGNOSIS — D62 Acute posthemorrhagic anemia: Secondary | ICD-10-CM

## 2013-08-18 DIAGNOSIS — F3289 Other specified depressive episodes: Secondary | ICD-10-CM | POA: Diagnosis present

## 2013-08-18 DIAGNOSIS — R0602 Shortness of breath: Secondary | ICD-10-CM | POA: Diagnosis present

## 2013-08-18 DIAGNOSIS — I824Z9 Acute embolism and thrombosis of unspecified deep veins of unspecified distal lower extremity: Secondary | ICD-10-CM | POA: Diagnosis present

## 2013-08-18 DIAGNOSIS — Z96659 Presence of unspecified artificial knee joint: Secondary | ICD-10-CM

## 2013-08-18 DIAGNOSIS — Z79899 Other long term (current) drug therapy: Secondary | ICD-10-CM

## 2013-08-18 DIAGNOSIS — I82402 Acute embolism and thrombosis of unspecified deep veins of left lower extremity: Secondary | ICD-10-CM

## 2013-08-18 DIAGNOSIS — F329 Major depressive disorder, single episode, unspecified: Secondary | ICD-10-CM | POA: Diagnosis present

## 2013-08-18 DIAGNOSIS — Z66 Do not resuscitate: Secondary | ICD-10-CM | POA: Diagnosis present

## 2013-08-18 DIAGNOSIS — K219 Gastro-esophageal reflux disease without esophagitis: Secondary | ICD-10-CM | POA: Diagnosis present

## 2013-08-18 DIAGNOSIS — I82409 Acute embolism and thrombosis of unspecified deep veins of unspecified lower extremity: Secondary | ICD-10-CM

## 2013-08-18 DIAGNOSIS — Z8249 Family history of ischemic heart disease and other diseases of the circulatory system: Secondary | ICD-10-CM

## 2013-08-18 LAB — COMPREHENSIVE METABOLIC PANEL
ALT: 255 U/L — ABNORMAL HIGH (ref 0–35)
AST: 69 U/L — ABNORMAL HIGH (ref 0–37)
Albumin: 3.1 g/dL — ABNORMAL LOW (ref 3.5–5.2)
Calcium: 9.7 mg/dL (ref 8.4–10.5)
Chloride: 96 mEq/L (ref 96–112)
GFR calc Af Amer: 83 mL/min — ABNORMAL LOW (ref >90)
Glucose, Bld: 172 mg/dL — ABNORMAL HIGH (ref 70–99)
Sodium: 134 mEq/L — ABNORMAL LOW (ref 135–145)
Total Protein: 6.9 g/dL (ref 6.0–8.3)

## 2013-08-18 LAB — CBC WITH DIFFERENTIAL/PLATELET
Basophils Absolute: 0 10*3/uL (ref 0.0–0.1)
Basophils Relative: 0 % (ref 0–1)
Eosinophils Absolute: 0 10*3/uL (ref 0.0–0.7)
Hemoglobin: 10 g/dL — ABNORMAL LOW (ref 12.0–15.0)
MCH: 25.4 pg — ABNORMAL LOW (ref 26.0–34.0)
MCHC: 32.7 g/dL (ref 30.0–36.0)
Monocytes Absolute: 0.6 10*3/uL (ref 0.1–1.0)
Neutrophils Relative %: 67 % (ref 43–77)
Platelets: 275 10*3/uL (ref 150–400)
RDW: 18 % — ABNORMAL HIGH (ref 11.5–15.5)

## 2013-08-18 LAB — PROTIME-INR
INR: 1.25 (ref 0.00–1.49)
Prothrombin Time: 15.4 s — ABNORMAL HIGH (ref 11.6–15.2)

## 2013-08-18 LAB — TROPONIN I: Troponin I: <0.3 ng/mL (ref <0.30)

## 2013-08-18 LAB — PRO B NATRIURETIC PEPTIDE: Pro B Natriuretic peptide (BNP): 164.2 pg/mL — ABNORMAL HIGH (ref 0–125)

## 2013-08-18 MED ORDER — OXYCODONE-ACETAMINOPHEN 5-325 MG PO TABS
2.0000 | ORAL_TABLET | Freq: Once | ORAL | Status: AC
  Administered 2013-08-18: 2 via ORAL
  Filled 2013-08-18: qty 2

## 2013-08-18 MED ORDER — IOHEXOL 350 MG/ML SOLN
100.0000 mL | Freq: Once | INTRAVENOUS | Status: AC | PRN
  Administered 2013-08-18: 100 mL via INTRAVENOUS

## 2013-08-18 NOTE — Telephone Encounter (Addendum)
PT DOING WELL. FATIGUE WITH CHEMO. NO BLACK STOOLS. ASKED ABOUT REFERRAL TO [facility]. REFER TO [facility] FOR SECOND OPINION ASAP. PT WOULD LIKE TO KNOW/EXHAUST ALL OPTIONS FOR TREATMENT. DX: GASTRIC ADENOCa.

## 2013-08-18 NOTE — Telephone Encounter (Signed)
Patient is scheduled with Dr. Lelon Frohlich at [facility] Tuesday Nov 18th at 3:00 and Ms. Linebaugh is aware. I am faxing records

## 2013-08-18 NOTE — ED Notes (Signed)
Ambulated to bathroom - voided Large amount - urine appears pale yellow in bowl and no foul odor noted.  Patient again stated she has been drinking " a lot of water today"  States she heard it was good for her so she decided to drink a lot of water today"

## 2013-08-18 NOTE — ED Notes (Signed)
Patient complaining of shortness of breath starting this evening. Denies chest pain. States "I have been drinking a lot of water and I think I may have drank too much." No obvious distress or difficulty breathing noted at triage.

## 2013-08-18 NOTE — ED Provider Notes (Signed)
CSN: [id number]     Arrival date & time 08/18/13  2043 History  This chart was scribed for Sharon Octave, MD by Blanchard Kelch, ED Scribe. The patient was seen in room APA18/APA18. Patient's care was started at 9:10 PM.    Chief Complaint  Patient presents with  . Shortness of Breath    Patient is a 72 y.o. female presenting with shortness of breath. The history is provided by the patient. No language interpreter was used.  Shortness of Breath   HPI Comments: [name] is a 72 y.o. female who presents to the Emergency Department complaining of constant shortness of breath that began a few hours ago while she was drinking water. The SOB is worsened when she lies down. She denies any alleviating factors. She denies previous episodes of shortness of breath that have been this severe. She denies abdominal pain, back pain, neck pain chest pain, cough, fever, She denies a history of asthma or COPD. She is currently undergoing chemotherapy for her stomach cancer.   Her oncologist is Dr. Jacalyn Lefevre at [facility].    Past Medical History  Diagnosis Date  . Hypertension   . Paroxysmal SVT (supraventricular tachycardia)   . Hypercholesteremia   . GERD (gastroesophageal reflux disease)   . PUD (peptic ulcer disease)   . DVT (deep venous thrombosis) 2007  . Adrenal adenoma     left  . Thyroid nodule     left  . H. pylori infection   . Diverticulosis   . 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 Diarrhea     recurrent  . Arthritis   . Hiatal hernia   . Cancer     adenocarcinoma   Past Surgical History  Procedure Laterality Date  . Total knee arthroplasty  bilateral knee  . Hip  replacement  left  . Appendectomy    . Cholecystectomy    . Abdominal hysterectomy    . Breast lumpectomy      left  . Ablation      of SVT  . Cardiac catheterization  2009    normal coronary arteries  . Colonoscopy  09/13/2009    WUJ:[id number] internal  hemorrhoids  . Esophagogastroduodenoscopy  09/13/2009    HQI:ONGEXB/  . Esophagogastroduodenoscopy N/A 06/11/2013    Procedure: ESOPHAGOGASTRODUODENOSCOPY (EGD);  Surgeon: West Bali, MD;  Location: [facility] ENDO SUITE;  Service: Endoscopy;  Laterality: N/A;  2:15-moved to 1045 [name] notified pt  . Portacath placement Left 07/06/2013    Procedure: INSERTION PORT-A-CATH;  Surgeon: Almond Lint, MD;  Location: [facility] ORS;  Service: General;  Laterality: Left;  . Laparotomy N/A 07/07/2013    Procedure: EXPLORATORY LAPAROTOMY;  Surgeon: Dalia Heading, MD;  Location: [facility] ORS;  Service: General;  Laterality: N/A;  . Gastrorrhaphy N/A 07/07/2013    Procedure: Ferrel Logan;  Surgeon: Dalia Heading, MD;  Location: [facility] ORS;  Service: General;  Laterality: N/A;   Family History  Problem Relation Age of Onset  . Colon cancer Neg Hx   . Cancer Mother     leukemia  . Cancer Sister     breast  . Heart disease Brother   . Heart disease Sister    History  Substance Use Topics  . Smoking status: Former Smoker -- 15 years  . Smokeless tobacco: Never Used     Comment: Quit smoking x 3 weeks  . Alcohol Use: No   OB History   Grav Para Term Preterm  Abortions TAB SAB Ect Mult Living                 Review of Systems  Respiratory: Positive for shortness of breath.    A complete 10 system review of systems was obtained and all systems are negative except as noted in the HPI and PMH.    Allergies  Review of patient's allergies indicates no known allergies.  Home Medications   Current Outpatient Rx  Name  Route  Sig  Dispense  Refill  . dicyclomine (BENTYL) 20 MG tablet   Oral   Take 20 mg by mouth 2 (two) times daily at 8 am and 10 pm.         . diltiazem (TIAZAC) 360 MG 24 hr capsule   Oral   Take 360 mg by mouth every morning.          [name] Calcium (STOOL SOFTENER PO)   Oral   Take 1 capsule by mouth 2 (two) times daily.         . fish oil-omega-3 fatty acids 1000 MG capsule    Oral   Take 1 g by mouth daily.         . fluconazole (DIFLUCAN) 100 MG tablet   Oral   Take 1 tablet (100 mg total) by mouth daily.   14 tablet   0   . Linaclotide (LINZESS) 145 MCG CAPS capsule   Oral   Take 145 mcg by mouth daily.         Marland Kitchen LORazepam (ATIVAN) 0.5 MG tablet   Oral   Take 1 tablet (0.5 mg total) by mouth every 6 (six) hours as needed (nausea/vomiting).   30 tablet   0   . lovastatin (MEVACOR) 40 MG tablet   Oral   Take 2 tablets (80 mg total) by mouth at bedtime.   60 tablet   11   . metoCLOPramide (REGLAN) 10 MG tablet   Oral   Take 1 tablet (10 mg total) by mouth 4 (four) times daily.   100 tablet   3   . metoprolol succinate (TOPROL-XL) 25 MG 24 hr tablet   Oral   Take 3 tablets (75 mg total) by mouth daily. Take with or immediately following a meal.   30 tablet   0   . ondansetron (ZOFRAN) 8 MG tablet   Oral   Take 1 tablet (8 mg total) by mouth every 8 (eight) hours as needed for nausea or vomiting.   20 tablet   0   . oxyCODONE (OXY IR/ROXICODONE) 5 MG immediate release tablet   Oral   Take 1 tablet (5 mg total) by mouth every 4 (four) hours as needed for pain.   60 tablet   0   . pantoprazole (PROTONIX) 40 MG tablet   Oral   Take 1 tablet (40 mg total) by mouth 2 (two) times daily.   60 tablet   2   . Probiotic Product (PROBIOTIC FORMULA PO)   Oral   Take 1 tablet by mouth every morning.         . prochlorperazine (COMPAZINE) 10 MG tablet   Oral   Take 1 tablet (10 mg total) by mouth every 6 (six) hours.   100 tablet   3   . temazepam (RESTORIL) 7.5 MG capsule   Oral   Take 7.5 mg by mouth at bedtime as needed for sleep.         Marland Kitchen dexamethasone (DECADRON) 4  MG tablet   Oral   Take 4 mg by mouth See admin instructions.          . lidocaine-prilocaine (EMLA) cream   Topical   Apply 1 application topically as needed (for port access).          . magnesium hydroxide (MILK OF MAGNESIA) 400 MG/5ML suspension    Oral   Take 30 mLs by mouth daily as needed for constipation.          Triage Vitals: BP 162/77  Pulse 66  Temp(Src) 98 F (36.7 C) (Oral)  Resp 16  Ht 5\' 6"  (1.676 m)  Wt 190 lb 3.2 oz (86.274 kg)  BMI 30.71 kg/m2  SpO2 100%  Physical Exam  Nursing note and vitals reviewed. Constitutional: She is oriented to person, place, and time. She appears well-developed and well-nourished.  HENT:  Head: Normocephalic and atraumatic.  Eyes: EOM are normal.  Neck: Normal range of motion.  Cardiovascular: Normal rate, regular rhythm and intact distal pulses.   Pulmonary/Chest: Effort normal and breath sounds normal. She has no wheezes. She has no rales.  Musculoskeletal: She exhibits no edema.  No leg swelling.  Neurological: She is alert and oriented to person, place, and time.  Skin: Skin is warm and dry.  MetaPort in left chest with chemotherapy infusing.  Psychiatric: She has a normal mood and affect.    ED Course  Procedures (including critical care time)  DIAGNOSTIC STUDIES: Oxygen Saturation is 100% on room air, normal by my interpretation.    COORDINATION OF CARE: 9:31 PM -Will order chest x-ray, Protime-INR, CBC, CMP, Troponin I, and BNP. Patient verbalizes understanding and agrees with treatment plan.    Labs Review Labs Reviewed  CBC WITH DIFFERENTIAL - Abnormal; Notable for the following:    Hemoglobin 10.0 (*)    HCT 30.6 (*)    MCV 77.9 (*)    MCH 25.4 (*)    RDW 18.0 (*)    Monocytes Relative 14 (*)    All other components within normal limits  COMPREHENSIVE METABOLIC PANEL - Abnormal; Notable for the following:    Sodium 134 (*)    Glucose, Bld 172 (*)    Albumin 3.1 (*)    AST 69 (*)    ALT 255 (*)    GFR calc non Af Amer 71 (*)    GFR calc Af Amer 83 (*)    All other components within normal limits  PRO B NATRIURETIC PEPTIDE - Abnormal; Notable for the following:    Pro B Natriuretic peptide (BNP) 164.2 (*)    All other components within normal  limits  PROTIME-INR - Abnormal; Notable for the following:    Prothrombin Time 15.4 (*)    All other components within normal limits  TROPONIN I   Imaging Review Dg Chest 2 View  08/18/2013   CLINICAL DATA:  Shortness of breath. History of gastric cancer. Ex-smoker.  EXAM: CHEST  2 VIEW  COMPARISON:  08/06/2013.  FINDINGS: The cardiac silhouette remains near the upper limit of normal in size. Clear lungs. Stable left subclavian porta catheter. Thoracic spine degenerative changes. Mild bilateral shoulder degenerative changes.  IMPRESSION: No acute abnormality.   Electronically Signed   By: Gordan Payment M.D.   On: 08/18/2013 22:08   Ct Angio Chest Pe W/cm &/or Wo Cm  08/19/2013   CLINICAL DATA:  Shortness of breath. Undergoing chemotherapy for gastric carcinoma.  EXAM: CT ANGIOGRAPHY CHEST WITH CONTRAST  TECHNIQUE: Multidetector CT imaging  of the chest was performed using the standard protocol during bolus administration of intravenous contrast. Multiplanar CT image reconstructions including MIPs were obtained to evaluate the vascular anatomy.  CONTRAST:  OMNIPAQUE IOHEXOL 350 MG/ML SOLN  COMPARISON:  Chest radiographs obtained earlier today. PET-CT dated 06/24/2013. Abdomen and pelvis CT dated 07/07/2013.  FINDINGS: Multiple bilateral pulmonary arterial filling defects. These include the upper and lower lobes bilaterally and the right middle lobe. No large central pulmonary arterial filling defects are seen. No lung nodules or enlarged lymph nodes. Thoracic spine degenerative changes. Left adrenal mass measuring 2.5 x 2.0 cm on image number 80 and partially included smaller right adrenal mass measuring 1.4 x 0.8 cm on image number 85. These have not changed significantly since 07/07/2013. Diffuse gastric wall thickening is again noted.  Review of the MIP images confirms the above findings.  IMPRESSION: 1. Multiple small to moderate-sized bilateral pulmonary emboli. 2. Stable bilateral adrenal  adenomas. 3. Persistent gastric wall thickening compatible with the patient's known gastric carcinoma. Critical Value/emergent results were called by telephone at the time of interpretation on 08/19/2013 at 12:10 AM to Dr.Aries Townley , who verbally acknowledged these results.   Electronically Signed   By: Gordan Payment M.D.   On: 08/19/2013 00:12    EKG Interpretation   None       MDM   1. Pulmonary emboli   2. Gastric carcinoma    Shortness of breath for the past several hours without associated symptoms. No cough, fever, chest pain. History of gastric carcinoma currently receiving chemotherapy with recent admission for melena. She states nausea has resolved.  Patient is in no distress, she is not hypoxic. Lungs are clear. Chest x-ray is negative.  With history of cancer and SOB, concern for PE. Multiple bilateral PEs seen on CT.  Patient remains hemodynamically stable.  No chest pain or SOB.  Hemoglobin today is stable. Rectal exam is Hemoccult-negative. Anticoagulation discussed with admitting hospitalist Dr. Onalee Hua. She will start heparin gtt cautiously.   CRITICAL CARE Performed by: [name] Total critical care time: 30 Critical care time was exclusive of separately billable procedures and treating other patients. Critical care was necessary to treat or prevent imminent or life-threatening deterioration. Critical care was time spent personally by me on the following activities: development of treatment plan with patient and/or surrogate as well as nursing, discussions with consultants, evaluation of patient's response to treatment, examination of patient, obtaining history from patient or surrogate, ordering and performing treatments and interventions, ordering and review of laboratory studies, ordering and review of radiographic studies, pulse oximetry and re-evaluation of patient's condition.   Date: 08/19/2013  Rate: 61  Rhythm: normal sinus rhythm  QRS Axis:  normal  Intervals: normal  ST/T Wave abnormalities: normal  Conduction Disutrbances:none  Narrative Interpretation:   Old EKG Reviewed: unchanged    I personally performed the services described in this documentation, which was scribed in my presence. The recorded information has been reviewed and is accurate.   Sharon Octave, MD 08/19/13 [phone]

## 2013-08-19 ENCOUNTER — Inpatient Hospital Stay (HOSPITAL_COMMUNITY): Payer: Medicare Other

## 2013-08-19 ENCOUNTER — Other Ambulatory Visit (HOSPITAL_COMMUNITY): Payer: Self-pay | Admitting: Oncology

## 2013-08-19 ENCOUNTER — Encounter (HOSPITAL_COMMUNITY): Payer: Self-pay | Admitting: *Deleted

## 2013-08-19 DIAGNOSIS — C169 Malignant neoplasm of stomach, unspecified: Secondary | ICD-10-CM

## 2013-08-19 DIAGNOSIS — F329 Major depressive disorder, single episode, unspecified: Secondary | ICD-10-CM

## 2013-08-19 DIAGNOSIS — D649 Anemia, unspecified: Secondary | ICD-10-CM

## 2013-08-19 DIAGNOSIS — D62 Acute posthemorrhagic anemia: Secondary | ICD-10-CM

## 2013-08-19 DIAGNOSIS — R0602 Shortness of breath: Secondary | ICD-10-CM

## 2013-08-19 DIAGNOSIS — I2699 Other pulmonary embolism without acute cor pulmonale: Secondary | ICD-10-CM

## 2013-08-19 DIAGNOSIS — I10 Essential (primary) hypertension: Secondary | ICD-10-CM

## 2013-08-19 DIAGNOSIS — K922 Gastrointestinal hemorrhage, unspecified: Secondary | ICD-10-CM

## 2013-08-19 LAB — HEPARIN LEVEL (UNFRACTIONATED): Heparin Unfractionated: 1.35 IU/mL — ABNORMAL HIGH (ref 0.30–0.70)

## 2013-08-19 LAB — BASIC METABOLIC PANEL
Calcium: 9.6 mg/dL (ref 8.4–10.5)
Chloride: 95 mEq/L — ABNORMAL LOW (ref 96–112)
Creatinine, Ser: 0.76 mg/dL (ref 0.50–1.10)
GFR calc Af Amer: >90 mL/min (ref >90)
GFR calc non Af Amer: 82 mL/min — ABNORMAL LOW (ref >90)
Glucose, Bld: 182 mg/dL — ABNORMAL HIGH (ref 70–99)
Potassium: 4.1 mEq/L (ref 3.5–5.1)
Sodium: 134 mEq/L — ABNORMAL LOW (ref 135–145)

## 2013-08-19 LAB — PROTIME-INR
INR: 1.29 (ref 0.00–1.49)
Prothrombin Time: 15.8 seconds — ABNORMAL HIGH (ref 11.6–15.2)

## 2013-08-19 LAB — HEMOGLOBIN AND HEMATOCRIT, BLOOD: Hemoglobin: 9.7 g/dL — ABNORMAL LOW (ref 12.0–15.0)

## 2013-08-19 LAB — OCCULT BLOOD, POC DEVICE: Fecal Occult Bld: NEGATIVE

## 2013-08-19 MED ORDER — LIDOCAINE-PRILOCAINE 2.5-2.5 % EX CREA
1.0000 "application " | TOPICAL_CREAM | CUTANEOUS | Status: DC | PRN
  Filled 2013-08-19: qty 5

## 2013-08-19 MED ORDER — DICYCLOMINE HCL 20 MG PO TABS
20.0000 mg | ORAL_TABLET | Freq: Two times a day (BID) | ORAL | Status: DC
  Filled 2013-08-19 (×3): qty 1

## 2013-08-19 MED ORDER — PANTOPRAZOLE SODIUM 40 MG PO TBEC
40.0000 mg | DELAYED_RELEASE_TABLET | Freq: Two times a day (BID) | ORAL | Status: DC
  Administered 2013-08-19 – 2013-08-23 (×9): 40 mg via ORAL
  Filled 2013-08-19 (×9): qty 1

## 2013-08-19 MED ORDER — ESCITALOPRAM OXALATE 10 MG PO TABS
10.0000 mg | ORAL_TABLET | Freq: Every day | ORAL | Status: DC
  Administered 2013-08-19 – 2013-08-23 (×5): 10 mg via ORAL
  Filled 2013-08-19 (×5): qty 1

## 2013-08-19 MED ORDER — TEMAZEPAM 7.5 MG PO CAPS: 7.5000 mg | ORAL_CAPSULE | Freq: Every evening | ORAL | Status: DC | PRN

## 2013-08-19 MED ORDER — ENOXAPARIN SODIUM 100 MG/ML ~~LOC~~ SOLN
1.0000 mg/kg | Freq: Two times a day (BID) | SUBCUTANEOUS | Status: DC
  Administered 2013-08-19 – 2013-08-20 (×2): 85 mg via SUBCUTANEOUS
  Filled 2013-08-19 (×2): qty 1

## 2013-08-19 MED ORDER — ONDANSETRON HCL 4 MG PO TABS: 8.0000 mg | ORAL_TABLET | Freq: Three times a day (TID) | ORAL | Status: DC | PRN

## 2013-08-19 MED ORDER — METOCLOPRAMIDE HCL 10 MG PO TABS
10.0000 mg | ORAL_TABLET | Freq: Three times a day (TID) | ORAL | Status: DC
  Administered 2013-08-19 – 2013-08-23 (×16): 10 mg via ORAL
  Filled 2013-08-19 (×17): qty 1

## 2013-08-19 MED ORDER — DEXAMETHASONE 4 MG PO TABS: 4.0000 mg | ORAL_TABLET | ORAL | Status: DC

## 2013-08-19 MED ORDER — HEPARIN SOD (PORK) LOCK FLUSH 100 UNIT/ML IV SOLN
INTRAVENOUS | Status: AC
  Filled 2013-08-19: qty 5

## 2013-08-19 MED ORDER — PROCHLORPERAZINE MALEATE 5 MG PO TABS
10.0000 mg | ORAL_TABLET | Freq: Four times a day (QID) | ORAL | Status: DC | PRN
  Administered 2013-08-19 – 2013-08-20 (×2): 10 mg via ORAL
  Filled 2013-08-19 (×2): qty 2

## 2013-08-19 MED ORDER — ZOLPIDEM TARTRATE 5 MG PO TABS
5.0000 mg | ORAL_TABLET | Freq: Every evening | ORAL | Status: DC | PRN
  Administered 2013-08-19 – 2013-08-22 (×4): 5 mg via ORAL
  Filled 2013-08-19 (×4): qty 1

## 2013-08-19 MED ORDER — OXYCODONE HCL 5 MG PO TABS
5.0000 mg | ORAL_TABLET | ORAL | Status: DC | PRN
  Administered 2013-08-19 – 2013-08-23 (×16): 5 mg via ORAL
  Filled 2013-08-19 (×16): qty 1

## 2013-08-19 MED ORDER — HEPARIN BOLUS VIA INFUSION
2500.0000 [IU] | Freq: Once | INTRAVENOUS | Status: AC
  Administered 2013-08-19: 2500 [IU] via INTRAVENOUS
  Filled 2013-08-19: qty 2500

## 2013-08-19 MED ORDER — HEPARIN (PORCINE) IN NACL 100-0.45 UNIT/ML-% IJ SOLN
950.0000 [IU]/h | INTRAMUSCULAR | Status: DC
  Administered 2013-08-19: 1250 [IU]/h via INTRAVENOUS
  Filled 2013-08-19: qty 250

## 2013-08-19 MED ORDER — SODIUM CHLORIDE 0.9 % IJ SOLN
3.0000 mL | INTRAMUSCULAR | Status: DC | PRN
  Administered 2013-08-21: 3 mL via INTRAVENOUS

## 2013-08-19 MED ORDER — DICYCLOMINE HCL 10 MG PO CAPS
20.0000 mg | ORAL_CAPSULE | Freq: Two times a day (BID) | ORAL | Status: DC
  Administered 2013-08-19 – 2013-08-23 (×9): 20 mg via ORAL
  Filled 2013-08-19 (×12): qty 2

## 2013-08-19 MED ORDER — SODIUM CHLORIDE 0.9 % IV SOLN: 250.0000 mL | INTRAVENOUS | Status: DC | PRN

## 2013-08-19 MED ORDER — DILTIAZEM HCL ER COATED BEADS 120 MG PO CP24
360.0000 mg | ORAL_CAPSULE | Freq: Every day | ORAL | Status: DC
  Administered 2013-08-19 – 2013-08-23 (×5): 360 mg via ORAL
  Filled 2013-08-19 (×12): qty 1

## 2013-08-19 MED ORDER — METOPROLOL SUCCINATE ER 50 MG PO TB24
75.0000 mg | ORAL_TABLET | Freq: Every day | ORAL | Status: DC
  Administered 2013-08-20 – 2013-08-23 (×4): 75 mg via ORAL
  Filled 2013-08-19 (×8): qty 1

## 2013-08-19 MED ORDER — FLUCONAZOLE 100 MG PO TABS
100.0000 mg | ORAL_TABLET | Freq: Every day | ORAL | Status: DC
  Administered 2013-08-19 – 2013-08-23 (×5): 100 mg via ORAL
  Filled 2013-08-19 (×5): qty 1

## 2013-08-19 MED ORDER — SODIUM CHLORIDE 0.9 % IJ SOLN
3.0000 mL | Freq: Two times a day (BID) | INTRAMUSCULAR | Status: DC
  Administered 2013-08-19 – 2013-08-23 (×6): 3 mL via INTRAVENOUS

## 2013-08-19 MED ORDER — LINACLOTIDE 145 MCG PO CAPS
145.0000 ug | ORAL_CAPSULE | Freq: Every day | ORAL | Status: DC
  Administered 2013-08-20 – 2013-08-23 (×4): 145 ug via ORAL
  Filled 2013-08-19 (×10): qty 1

## 2013-08-19 MED ORDER — LORAZEPAM 0.5 MG PO TABS
0.5000 mg | ORAL_TABLET | Freq: Four times a day (QID) | ORAL | Status: DC | PRN
  Administered 2013-08-19 – 2013-08-23 (×12): 0.5 mg via ORAL
  Filled 2013-08-19 (×12): qty 1

## 2013-08-19 NOTE — Telephone Encounter (Signed)
REVIEWED.  

## 2013-08-19 NOTE — Progress Notes (Signed)
UR chart review completed.  

## 2013-08-19 NOTE — ED Notes (Signed)
Ambulatory to bathroom to void.  Pulse ox 95% when returned to stretcher

## 2013-08-19 NOTE — Progress Notes (Signed)
ANTICOAGULATION CONSULT NOTE  Pharmacy Consult for Heparin Indication: pulmonary embolus  No Known Allergies  Patient Measurements: Height: 5\' 6"  (167.6 cm) Weight: 190 lb 3.2 oz (86.274 kg) IBW/kg (Calculated) : 59.3 Heparin Dosing Weight: 77 kg  Vital Signs: Temp: 98.6 F (37 C) (11/13 1157) Temp src: Oral (11/13 0139) BP: 149/77 mmHg (11/13 1000) Pulse Rate: 59 (11/13 1000)  Labs:  Recent Labs  08/16/13 1258 08/18/13 2129 08/19/13 0308 08/19/13 1043  HGB 9.0* 10.0* 9.7*  --   HCT 27.7* 30.6* 29.6*  --   PLT 300 275  --   --   LABPROT  --  15.4* 15.8*  --   INR  --  1.25 1.29  --   HEPARINUNFRC  --   --   --  1.35*  CREATININE  --  0.80 0.76  --   TROPONINI  --  <0.30  --   --     Estimated Creatinine Clearance: 69.3 ml/min (by C-G formula based on Cr of 0.76).   Medical History: Past Medical History  Diagnosis Date  . Hypertension   . Paroxysmal SVT (supraventricular tachycardia)   . Hypercholesteremia   . GERD (gastroesophageal reflux disease)   . PUD (peptic ulcer disease)   . DVT (deep venous thrombosis) 2007  . Adrenal adenoma     left  . Thyroid nodule     left  . H. pylori infection   . Diverticulosis   . 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 Diarrhea     recurrent  . Arthritis   . Hiatal hernia   . Cancer     adenocarcinoma    Medications:  Scheduled:  . dicyclomine  20 mg Oral BID AC & HS  . diltiazem (CARDIZEM CD) 24 hr capsule 360 mg  360 mg Oral Q breakfast  . fluconazole  100 mg Oral Daily  . Linaclotide  145 mcg Oral Daily  . metoCLOPramide  10 mg Oral TID AC & HS  . metoprolol succinate  75 mg Oral Daily  . pantoprazole  40 mg Oral BID  . sodium chloride  3 mL Intravenous Q12H   Prescriptions prior to admission  Medication Sig Dispense Refill  . dexamethasone (DECADRON) 4 MG tablet Take 8 mg by mouth See admin instructions. 2 take tablets once daily on the day after chemo and then take  2 tablets twice daily for the next two days.      Marland Kitchen dicyclomine (BENTYL) 20 MG tablet Take 20 mg by mouth 2 (two) times daily at 8 am and 10 pm.      . diltiazem (TIAZAC) 360 MG 24 hr capsule Take 360 mg by mouth every morning.       [name] Calcium (STOOL SOFTENER PO) Take 1 capsule by mouth 2 (two) times daily.      . fish oil-omega-3 fatty acids 1000 MG capsule Take 1 g by mouth daily.      . fluconazole (DIFLUCAN) 100 MG tablet Take 1 tablet (100 mg total) by mouth daily.  14 tablet  0  . Linaclotide (LINZESS) 145 MCG CAPS capsule Take 145 mcg by mouth daily.      Marland Kitchen LORazepam (ATIVAN) 0.5 MG tablet Take 1 tablet (0.5 mg total) by mouth every 6 (six) hours as needed (nausea/vomiting).  30 tablet  0  . lovastatin (MEVACOR) 40 MG tablet Take 2 tablets (80 mg total) by mouth at bedtime.  60 tablet  11  .  metoCLOPramide (REGLAN) 10 MG tablet Take 1 tablet (10 mg total) by mouth 4 (four) times daily.  100 tablet  3  . metoprolol succinate (TOPROL-XL) 25 MG 24 hr tablet Take 3 tablets (75 mg total) by mouth daily. Take with or immediately following a meal.  30 tablet  0  . ondansetron (ZOFRAN) 8 MG tablet Take 1 tablet (8 mg total) by mouth every 8 (eight) hours as needed for nausea or vomiting.  20 tablet  0  . oxyCODONE (OXY IR/ROXICODONE) 5 MG immediate release tablet Take 1 tablet (5 mg total) by mouth every 4 (four) hours as needed for pain.  60 tablet  0  . pantoprazole (PROTONIX) 40 MG tablet Take 1 tablet (40 mg total) by mouth 2 (two) times daily.  60 tablet  2  . Probiotic Product (PROBIOTIC FORMULA PO) Take 1 tablet by mouth every morning.      . prochlorperazine (COMPAZINE) 10 MG tablet Take 1 tablet (10 mg total) by mouth every 6 (six) hours.  100 tablet  3  . temazepam (RESTORIL) 7.5 MG capsule Take 7.5 mg by mouth at bedtime as needed for sleep.      Marland Kitchen dexamethasone (DECADRON) 4 MG tablet Take 4 mg by mouth See admin instructions.       . lidocaine-prilocaine (EMLA) cream Apply 1  application topically as needed (for port access).       . magnesium hydroxide (MILK OF MAGNESIA) 400 MG/5ML suspension Take 30 mLs by mouth daily as needed for constipation.       Assessment: 72 yo female h/o stage 4 adenocarcinoma gastric with recent melena and UGIB from cancer 2 weeks ago. She was heme neg per report on EDP rectal exam. Her hgb has been stable since her GIB.  She presents with new PE this admission and has been started on IV heparin.   Initial heparin level elevated. No bleeding noted. Awaiting heme/onc recommendations for long-term anticoagulation plans.  Goal of Therapy:  Heparin level 0.3-0.7 units/ml Monitor platelets by anticoagulation protocol: Yes   Plan:  Hold heparin x1 hour, then resume infusion at 950 units/hr Recheck 8 hr heparin level Daily heparin level & CBC while on heparin   [name], [facility] 08/19/2013,12:38 PM

## 2013-08-19 NOTE — Consult Note (Signed)
[facility] Consultation Oncology  Name: [name]      MRN: [id number]    Location: A301/A301-01  Date: 08/19/2013 Time:2:38 PM   REFERRING PHYSICIAN:  [name], MD  REASON FOR CONSULT:  B/L PE in the setting of Stage IV Gastric Ca   DIAGNOSIS:  Stage IV Gastric Cancer  HISTORY OF PRESENT ILLNESS:   [name] is an African American woman who is 72 years old who was diagnosed with Stage IV Gastric cancer following perforation of stomach on 07/07/2013 with repair by Dr. Lovell Sheehan and biopsy-proven metastatic peritoneal lesion. She is receiving ECF chemotherapy and she is Day 69 of Cycle 1 today.  She reports sudden onset of shortness of breath with chest pain which led her to report to the ED.  She denies any hemoptysis and vomiting.  She presented to the ED on 07/18/2013.  CT angio of chest was positive for B/L PE, small to moderate sized.  She was therefore admitted to the Hospital for management of PE.   She admits to some nausea, but no vomiting while admitted.  Additionally, she reports that she is depressed.  She denies any thoughts of suicide or homicide.  She admits to spontaneous crying at times.  I will start Lexapro at 10 mg daily.   She reports that she has an appointment at [facility] for a second opinion regarding her oncology treatment.  We of course, support that decision.  I have asked her to let us know how we can help with this opinion and if records are needed to let us know as we would be happy to send records via fax.   In light of her newly diagnosed PE, I will stop chemotherapy today.  I will postpone chemotherapy that is scheduled for Monday (08/23/2013).  This will be postponed x 1 week.    Treatment of PE will be transititioned to Lovenox 1 mg/kg BID and then 1.5 mg/kg as an outpatient. We do not recommend IVC Filter as they are VTE promoting.  We recommend PRBC transfusion as necessary.  PAST MEDICAL HISTORY:   Past Medical History   Diagnosis Date  . Hypertension   . Paroxysmal SVT (supraventricular tachycardia)   . Hypercholesteremia   . GERD (gastroesophageal reflux disease)   . PUD (peptic ulcer disease)   . DVT (deep venous thrombosis) 2007  . Adrenal adenoma     left  . Thyroid nodule     left  . H. pylori infection   . Diverticulosis   . 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 Diarrhea     recurrent  . Arthritis   . Hiatal hernia   . Cancer     adenocarcinoma    ALLERGIES: No Known Allergies    MEDICATIONS: I have reviewed the patient's current medications.     PAST SURGICAL HISTORY Past Surgical History  Procedure Laterality Date  . Total knee arthroplasty  bilateral knee  . Hip  replacement  left  . Appendectomy    . Cholecystectomy    . Abdominal hysterectomy    . Breast lumpectomy      left  . Ablation      of SVT  . Cardiac catheterization  2009    normal coronary arteries  . Colonoscopy  09/13/2009    WUJ:WJXBJYNWGNF/AOZHYQMV internal hemorrhoids  . Esophagogastroduodenoscopy  09/13/2009    HQI:ONGEXB/  . Esophagogastroduodenoscopy N/A 06/11/2013    Procedure: ESOPHAGOGASTRODUODENOSCOPY (EGD);  Surgeon:  West Bali, MD;  Location: [facility];  Service: Endoscopy;  Laterality: N/A;  2:15-moved to 1045 [name] notified pt  . Portacath placement Left 07/06/2013    Procedure: INSERTION PORT-A-CATH;  Surgeon: Almond Lint, MD;  Location: [facility] ORS;  Service: General;  Laterality: Left;  . Laparotomy N/A 07/07/2013    Procedure: EXPLORATORY LAPAROTOMY;  Surgeon: Dalia Heading, MD;  Location: [facility] ORS;  Service: General;  Laterality: N/A;  . Gastrorrhaphy N/A 07/07/2013    Procedure: Ferrel Logan;  Surgeon: Dalia Heading, MD;  Location: [facility] ORS;  Service: General;  Laterality: N/A;    FAMILY HISTORY: Family History  Problem Relation Age of Onset  . Colon cancer Neg Hx   . Cancer Mother     leukemia  . Cancer Sister     breast  . Heart disease  Brother   . Heart disease Sister     SOCIAL HISTORY:  reports that she has quit smoking. She has never used smokeless tobacco. She reports that she does not drink alcohol or use illicit drugs.  PERFORMANCE STATUS: The patient's performance status is 2 - Symptomatic, <50% confined to bed  PHYSICAL EXAM: Most Recent Vital Signs: Blood pressure 149/77, pulse 59, temperature 98.6 F (37 C), temperature source Oral, resp. rate 13, height 5\' 6"  (1.676 m), weight 190 lb 3.2 oz (86.274 kg), SpO2 97.00%. General appearance: alert, cooperative, appears stated age, no distress and defeated affect Head: Normocephalic, without obvious abnormality, atraumatic Eyes: conjunctivae/corneas clear. PERRL, EOM's intact. Fundi benign. Neck: supple, symmetrical, trachea midline Back: negative Lungs: clear to auscultation bilaterally Heart: regular rate and rhythm, S1, S2 normal, no murmur, click, rub or gallop Extremities: extremities normal, atraumatic, no cyanosis or edema Skin: Skin color, texture, turgor normal. No rashes or lesions Neurologic: Grossly normal  LABORATORY DATA:  Results for orders placed during the hospital encounter of 08/18/13 (from the past 48 hour(s))  CBC WITH DIFFERENTIAL     Status: Abnormal   Collection Time    08/18/13  9:29 PM      Result Value Range   WBC 4.5  4.0 - 10.5 K/uL   RBC 3.93  3.87 - 5.11 MIL/uL   Hemoglobin 10.0 (*) 12.0 - 15.0 g/dL   HCT 40.9 (*) 81.1 - 91.4 %   MCV 77.9 (*) 78.0 - 100.0 fL   MCH 25.4 (*) 26.0 - 34.0 pg   MCHC 32.7  30.0 - 36.0 g/dL   RDW 78.2 (*) 95.6 - 21.3 %   Platelets 275  150 - 400 K/uL   Neutrophils Relative % 67  43 - 77 %   Lymphocytes Relative 19  12 - 46 %   Monocytes Relative 14 (*) 3 - 12 %   Eosinophils Relative 0  0 - 5 %   Basophils Relative 0  0 - 1 %   Neutro Abs 3.0  1.7 - 7.7 K/uL   Lymphs Abs 0.9  0.7 - 4.0 K/uL   Monocytes Absolute 0.6  0.1 - 1.0 K/uL   Eosinophils Absolute 0.0  0.0 - 0.7 K/uL   Basophils  Absolute 0.0  0.0 - 0.1 K/uL   RBC Morphology RARE NRBCs     Comment: POLYCHROMASIA PRESENT     SPHEROCYTES  COMPREHENSIVE METABOLIC PANEL     Status: Abnormal   Collection Time    08/18/13  9:29 PM      Result Value Range   Sodium 134 (*) 135 - 145 mEq/L   Potassium 4.0  3.5 - 5.1 mEq/L   Chloride 96  96 - 112 mEq/L   CO2 26  19 - 32 mEq/L   Glucose, Bld 172 (*) 70 - 99 mg/dL   BUN 19  6 - 23 mg/dL   Creatinine, Ser 4.09  0.50 - 1.10 mg/dL   Calcium 9.7  8.4 - 81.1 mg/dL   Total Protein 6.9  6.0 - 8.3 g/dL   Albumin 3.1 (*) 3.5 - 5.2 g/dL   AST 69 (*) 0 - 37 U/L   ALT 255 (*) 0 - 35 U/L   Alkaline Phosphatase 78  39 - 117 U/L   Total Bilirubin 0.3  0.3 - 1.2 mg/dL   GFR calc non Af Amer 71 (*) >90 mL/min   GFR calc Af Amer 83 (*) >90 mL/min   Comment: (NOTE)     The eGFR has been calculated using the CKD EPI equation.     This calculation has not been validated in all clinical situations.     eGFR's persistently <90 mL/min signify possible Chronic Kidney     Disease.  TROPONIN I     Status: None   Collection Time    08/18/13  9:29 PM      Result Value Range   Troponin I <0.30  <0.30 ng/mL   Comment:            Due to the release kinetics of cTnI,     a negative result within the first hours     of the onset of symptoms does not rule out     myocardial infarction with certainty.     If myocardial infarction is still suspected,     repeat the test at appropriate intervals.  PRO B NATRIURETIC PEPTIDE     Status: Abnormal   Collection Time    08/18/13  9:29 PM      Result Value Range   Pro B Natriuretic peptide (BNP) 164.2 (*) 0 - 125 pg/mL  PROTIME-INR     Status: Abnormal   Collection Time    08/18/13  9:29 PM      Result Value Range   Prothrombin Time 15.4 (*) 11.6 - 15.2 seconds   INR 1.25  0.00 - 1.49  OCCULT BLOOD, POC DEVICE     Status: None   Collection Time    08/19/13 12:35 AM      Result Value Range   Fecal Occult Bld NEGATIVE  NEGATIVE  MRSA PCR  SCREENING     Status: None   Collection Time    08/19/13  1:45 AM      Result Value Range   MRSA by PCR NEGATIVE  NEGATIVE   Comment:            The GeneXpert MRSA Assay (FDA     approved for NASAL specimens     only), is one component of a     comprehensive MRSA colonization     surveillance program. It is not     intended to diagnose MRSA     infection nor to guide or     monitor treatment for     MRSA infections.  BASIC METABOLIC PANEL     Status: Abnormal   Collection Time    08/19/13  3:08 AM      Result Value Range   Sodium 134 (*) 135 - 145 mEq/L   Potassium 4.1  3.5 - 5.1 mEq/L   Chloride 95 (*) 96 - 112 mEq/L  CO2 28  19 - 32 mEq/L   Glucose, Bld 182 (*) 70 - 99 mg/dL   BUN 18  6 - 23 mg/dL   Creatinine, Ser 4.09  0.50 - 1.10 mg/dL   Calcium 9.6  8.4 - 81.1 mg/dL   GFR calc non Af Amer 82 (*) >90 mL/min   GFR calc Af Amer >90  >90 mL/min   Comment: (NOTE)     The eGFR has been calculated using the CKD EPI equation.     This calculation has not been validated in all clinical situations.     eGFR's persistently <90 mL/min signify possible Chronic Kidney     Disease.  PROTIME-INR     Status: Abnormal   Collection Time    08/19/13  3:08 AM      Result Value Range   Prothrombin Time 15.8 (*) 11.6 - 15.2 seconds   INR 1.29  0.00 - 1.49  HEMOGLOBIN AND HEMATOCRIT, BLOOD     Status: Abnormal   Collection Time    08/19/13  3:08 AM      Result Value Range   Hemoglobin 9.7 (*) 12.0 - 15.0 g/dL   HCT 91.4 (*) 78.2 - 95.6 %  HEPARIN LEVEL (UNFRACTIONATED)     Status: Abnormal   Collection Time    08/19/13 10:43 AM      Result Value Range   Heparin Unfractionated 1.35 (*) 0.30 - 0.70 IU/mL   Comment:            IF HEPARIN RESULTS ARE BELOW     EXPECTED VALUES, AND PATIENT     DOSAGE HAS BEEN CONFIRMED,     SUGGEST FOLLOW UP TESTING     OF ANTITHROMBIN III LEVELS.      RADIOGRAPHY: Dg Chest 2 View  08/18/2013   CLINICAL DATA:  Shortness of breath. History of  gastric cancer. Ex-smoker.  EXAM: CHEST  2 VIEW  COMPARISON:  08/06/2013.  FINDINGS: The cardiac silhouette remains near the upper limit of normal in size. Clear lungs. Stable left subclavian porta catheter. Thoracic spine degenerative changes. Mild bilateral shoulder degenerative changes.  IMPRESSION: No acute abnormality.   Electronically Signed   By: Gordan Payment M.D.   On: 08/18/2013 22:08   Ct Angio Chest Pe W/cm &/or Wo Cm  08/19/2013   CLINICAL DATA:  Shortness of breath. Undergoing chemotherapy for gastric carcinoma.  EXAM: CT ANGIOGRAPHY CHEST WITH CONTRAST  TECHNIQUE: Multidetector CT imaging of the chest was performed using the standard protocol during bolus administration of intravenous contrast. Multiplanar CT image reconstructions including MIPs were obtained to evaluate the vascular anatomy.  CONTRAST:  OMNIPAQUE IOHEXOL 350 MG/ML SOLN  COMPARISON:  Chest radiographs obtained earlier today. PET-CT dated 06/24/2013. Abdomen and pelvis CT dated 07/07/2013.  FINDINGS: Multiple bilateral pulmonary arterial filling defects. These include the upper and lower lobes bilaterally and the right middle lobe. No large central pulmonary arterial filling defects are seen. No lung nodules or enlarged lymph nodes. Thoracic spine degenerative changes. Left adrenal mass measuring 2.5 x 2.0 cm on image number 80 and partially included smaller right adrenal mass measuring 1.4 x 0.8 cm on image number 85. These have not changed significantly since 07/07/2013. Diffuse gastric wall thickening is again noted.  Review of the MIP images confirms the above findings.  IMPRESSION: 1. Multiple small to moderate-sized bilateral pulmonary emboli. 2. Stable bilateral adrenal adenomas. 3. Persistent gastric wall thickening compatible with the patient's known gastric carcinoma. Critical Value/emergent results were called  by telephone at the time of interpretation on 08/19/2013 at 12:10 AM to Dr.STEPHEN RANCOUR , who verbally  acknowledged these results.   Electronically Signed   By: Gordan Payment M.D.   On: 08/19/2013 00:12       ASSESSMENT/PLAN:  1. Newly diagnosed B/L small-moderate sized PE in the setting of Stage IV Gastric Cancer.  Will order B/L LE [place] to evaluate for DVT.  Will transition the patient to Lovenox 1 mg/kg BID while in the hospital.  At time of discharge, recommend transitioning to Lovenox 1.5 mg/kg daily.  We will reevaluate the need of temporary IVC filter after reviewing the Venous Doppler of Lower extremities report 2. Depression, will start Lexapro 10 mg daily 3. Stage IV Gastric Cancer, Day 18 of Cycle 1 today.  Will D/C chemotherapy today and post-pone Monday's chemotherapy day x 7 days.  Second opinion appointment at [facility] is scheduled for 08/24/2013.  4. Acute GI bleed, recommend PRBC transfusion for anemia when indicated.  5. Transaminitis, unknown etiology presently.  Followed by GI. ?5FU hepatoxicity? 6. Will follow as an inpatient.  All questions were answered. The patient knows to call the clinic with any problems, questions or concerns. We can certainly see the patient much sooner if necessary.  Patient and plan discussed with Dr. Annamarie Dawley and she is in agreement with the aforementioned.    KEFALAS,THOMAS

## 2013-08-19 NOTE — Care Management Note (Addendum)
    Page 1 of 2   08/23/2013     11:14:02 AM   CARE MANAGEMENT NOTE 08/23/2013  Patient:  [name], [name]   Account Number:  [id number]  Date Initiated:  08/19/2013  Documentation initiated by:  Sharrie Rothman  Subjective/Objective Assessment:   Pt admitted from home with SOB. Pt found to have bilateral PE. Pt is staying with her son and daughter in law and is active with [facility] RN, PT, and aide. Pt is requiring some assistance with ADL's.     Action/Plan:   Will arrange resumption of HH at discharge.   Anticipated DC Date:  08/23/2013   Anticipated DC Plan:  HOME W HOME HEALTH SERVICES      DC Planning Services  CM consult      [facility] Choice  Resumption Of Svcs/PTA Provider   Choice offered to / List presented to:  C-1 Patient        HH arranged  HH-1 RN  HH-2 PT  HH-4 NURSE'S AIDE      HH agency  [facility]   Status of service:  Completed, signed off Medicare Important Message given?  YES (If response is "NO", the following Medicare IM given date fields will be blank) Date Medicare IM given:  08/20/2013 Date Additional Medicare IM given:  08/23/2013  Discharge Disposition:  HOME W HOME HEALTH SERVICES  Per UR Regulation:    If discussed at Long Length of Stay Meetings, dates discussed:    Comments:  08/23/13 1112 Arlyss Queen, RN BSN CM Pt discharged home today with resumption of [facility]. Alroy Bailiff of [facility] is aware and will collect the pts information from the chart. HH services to start within 48 hours of discharge. Pt and pts nurse aware of discharge arrangements.  08/20/13 1330 Arlyss Queen, RN BSN CM If pt d/c's over the weekend, weekend staff will arrange resumption of HH with [facility]. No DME needs noted.  08/19/13 1245 Arlyss Queen, RN BSN CM

## 2013-08-19 NOTE — Progress Notes (Signed)
Patient transferred to room 301. Vital signs stable at transfer. Chrisandra Netters RN given report.

## 2013-08-19 NOTE — Progress Notes (Signed)
..  [name] is inpatient in room 301. I was notified by T. Jacalyn Lefevre PA to d/c 56fu pump due to patient status. port de accessed and flushed with 20 cc nsaline after 88 cc of  continous infusion of 57fu.  Portacath located lt chest wall deaccessed with  Portacath flushed with 20ml NS . Patient tolerated procedure well.

## 2013-08-19 NOTE — Progress Notes (Signed)
Present with patient for emotional/spiritual support.  Her son was also present.  She has pastoral support from her minister also.  She wanted prayer.  We prayed together. Will follow up.

## 2013-08-19 NOTE — H&P (Signed)
PCP:   Evlyn Courier, MD   Chief Complaint:  sob  HPI: 72 yo female h/o stage 4 adenocarcinoma gastric with recent melena and ugib from cancer 2 weeks ago, htn, comes in with sudden onset of sob earlier tonight.  Denies cp.  Feels fine.  Denies any calf pain or swelling.  No hemoptysis.  No melena or brbpr since she was discharged approx 2 weeks ago.  Is getting chemo infusion now.  No fevers.  No cough.  Ct chest shows multiple bilateral PE.  She was heme neg per report on EDP rectal exam.  Her hgb has been stable since her gib.  Review of Systems:  Positive and negative as per HPI otherwise all other systems are negative  Past Medical History: Past Medical History  Diagnosis Date  . Hypertension   . Paroxysmal SVT (supraventricular tachycardia)   . Hypercholesteremia   . GERD (gastroesophageal reflux disease)   . PUD (peptic ulcer disease)   . DVT (deep venous thrombosis) 2007  . Adrenal adenoma     left  . Thyroid nodule     left  . H. pylori infection   . Diverticulosis   . 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 Diarrhea     recurrent  . Arthritis   . Hiatal hernia   . Cancer     adenocarcinoma   Past Surgical History  Procedure Laterality Date  . Total knee arthroplasty  bilateral knee  . Hip  replacement  left  . Appendectomy    . Cholecystectomy    . Abdominal hysterectomy    . Breast lumpectomy      left  . Ablation      of SVT  . Cardiac catheterization  2009    normal coronary arteries  . Colonoscopy  09/13/2009    ZOX:[id number] internal hemorrhoids  . Esophagogastroduodenoscopy  09/13/2009    FAO:ZHYQMV/  . Esophagogastroduodenoscopy N/Frost 06/11/2013    Procedure: ESOPHAGOGASTRODUODENOSCOPY (EGD);  Surgeon: West Bali, MD;  Location: [facility];  Service: Endoscopy;  Laterality: N/Frost;  2:15-moved to 1045 [name] notified pt  . Portacath placement Left 07/06/2013    Procedure: INSERTION PORT-Frost-CATH;   Surgeon: Almond Lint, MD;  Location: [facility];  Service: General;  Laterality: Left;  . Laparotomy N/Frost 07/07/2013    Procedure: EXPLORATORY LAPAROTOMY;  Surgeon: Dalia Heading, MD;  Location: [facility];  Service: General;  Laterality: N/Frost;  . Gastrorrhaphy N/Frost 07/07/2013    Procedure: Ferrel Logan;  Surgeon: Dalia Heading, MD;  Location: [facility];  Service: General;  Laterality: N/Frost;    Medications: Prior to Admission medications   Medication Sig Start Date End Date Taking? Authorizing Provider  dicyclomine (BENTYL) 20 MG tablet Take 20 mg by mouth 2 (two) times daily at 8 am and 10 pm.   Yes Historical Provider, MD  diltiazem (TIAZAC) 360 MG 24 hr capsule Take 360 mg by mouth every morning.  05/01/12  Yes Gaylord Shih, MD  Docusate Calcium (STOOL SOFTENER PO) Take 1 capsule by mouth 2 (two) times daily.   Yes Historical Provider, MD  fish oil-omega-3 fatty acids 1000 MG capsule Take 1 g by mouth daily.   Yes Historical Provider, MD  fluconazole (DIFLUCAN) 100 MG tablet Take 1 tablet (100 mg total) by mouth daily. 08/10/13  Yes Jerald Kief, MD  Linaclotide [facility]) 145 MCG CAPS capsule Take 145 mcg by mouth daily.   Yes Historical Provider, MD  LORazepam (ATIVAN) 0.5 MG tablet Take 1 tablet (0.5 mg total) by mouth every 6 (six) hours as needed (nausea/vomiting). 08/10/13  Yes Jerald Kief, MD  lovastatin (MEVACOR) 40 MG tablet Take 2 tablets (80 mg total) by mouth at bedtime. 11/11/12  Yes Gaylord Shih, MD  metoCLOPramide (REGLAN) 10 MG tablet Take 1 tablet (10 mg total) by mouth 4 (four) times daily. 07/15/13  Yes Dalia Heading, MD  metoprolol succinate (TOPROL-XL) 25 MG 24 hr tablet Take 3 tablets (75 mg total) by mouth daily. Take with or immediately following Frost meal. 08/10/13  Yes Jerald Kief, MD  ondansetron (ZOFRAN) 8 MG tablet Take 1 tablet (8 mg total) by mouth every 8 (eight) hours as needed for nausea or vomiting. 08/10/13  Yes Jerald Kief, MD  oxyCODONE (OXY IR/ROXICODONE) 5 MG  immediate release tablet Take 1 tablet (5 mg total) by mouth every 4 (four) hours as needed for pain. 07/27/13  Yes Maurine Minister Kefalas, PA-C  pantoprazole (PROTONIX) 40 MG tablet Take 1 tablet (40 mg total) by mouth 2 (two) times daily. 07/15/13  Yes Dalia Heading, MD  Probiotic Product (PROBIOTIC FORMULA PO) Take 1 tablet by mouth every morning.   Yes Historical Provider, MD  prochlorperazine (COMPAZINE) 10 MG tablet Take 1 tablet (10 mg total) by mouth every 6 (six) hours. 08/13/13  Yes Maurine Minister Kefalas, PA-C  temazepam (RESTORIL) 7.5 MG capsule Take 7.5 mg by mouth at bedtime as needed for sleep.   Yes Historical Provider, MD  dexamethasone (DECADRON) 4 MG tablet Take 4 mg by mouth See admin instructions.     Historical Provider, MD  lidocaine-prilocaine (EMLA) cream Apply 1 application topically as needed (for port access).     Historical Provider, MD  magnesium hydroxide (MILK OF MAGNESIA) 400 MG/5ML suspension Take 30 mLs by mouth daily as needed for constipation.    Historical Provider, MD    Allergies:  No Known Allergies  Social History:  reports that she has quit smoking. She has never used smokeless tobacco. She reports that she does not drink alcohol or use illicit drugs.  Family History: Family History  Problem Relation Age of Onset  . Colon cancer Neg Hx   . Cancer Mother     leukemia  . Cancer Sister     breast  . Heart disease Brother   . Heart disease Sister     Physical Exam: Filed Vitals:   08/18/13 2117 08/18/13 2139 08/19/13 0005 08/19/13 0139  BP:  152/59 182/87   Pulse:  62 64   Temp:    98.3 F (36.8 C)  TempSrc:    Oral  Resp:   18   Height:      Weight:      SpO2: 99% 100% 98%    General appearance: alert, cooperative and no distress Head: Normocephalic, without obvious abnormality, atraumatic Eyes: negative Nose: Nares normal. Septum midline. Mucosa normal. No drainage or sinus tenderness. Neck: no JVD and supple, symmetrical, trachea  midline Lungs: clear to auscultation bilaterally Heart: regular rate and rhythm, S1, S2 normal, no murmur, click, rub or gallop Abdomen: soft, non-tender; bowel sounds normal; no masses,  no organomegaly Extremities: extremities normal, atraumatic, no cyanosis or edema Pulses: 2+ and symmetric Skin: Skin color, texture, turgor normal. No rashes or lesions Neurologic: Grossly normal   Labs on Admission:   Recent Labs  08/18/13 2129  NA 134*  K 4.0  CL 96  CO2 26  GLUCOSE  172*  BUN 19  CREATININE 0.80  CALCIUM 9.7    Recent Labs  08/18/13 2129  AST 69*  ALT 255*  ALKPHOS 78  BILITOT 0.3  PROT 6.9  ALBUMIN 3.1*    Recent Labs  08/16/13 1258 08/18/13 2129  WBC 2.9* 4.5  NEUTROABS 2.1 3.0  HGB 9.0* 10.0*  HCT 27.7* 30.6*  MCV 77.8* 77.9*  PLT 300 275    Recent Labs  08/18/13 2129  TROPONINI <0.30   Radiological Exams on Admission: Dg Chest 2 View  08/18/2013   CLINICAL DATA:  Shortness of breath. History of gastric cancer. Ex-smoker.  EXAM: CHEST  2 VIEW  COMPARISON:  08/06/2013.  FINDINGS: The cardiac silhouette remains near the upper limit of normal in size. Clear lungs. Stable left subclavian porta catheter. Thoracic spine degenerative changes. Mild bilateral shoulder degenerative changes.  IMPRESSION: No acute abnormality.   Electronically Signed   By: Gordan Payment M.D.   On: 08/18/2013 22:08   Ct Angio Chest Pe W/cm &/or Wo Cm  08/19/2013   CLINICAL DATA:  Shortness of breath. Undergoing chemotherapy for gastric carcinoma.  EXAM: CT ANGIOGRAPHY CHEST WITH CONTRAST  TECHNIQUE: Multidetector CT imaging of the chest was performed using the standard protocol during bolus administration of intravenous contrast. Multiplanar CT image reconstructions including MIPs were obtained to evaluate the vascular anatomy.  CONTRAST:  OMNIPAQUE IOHEXOL 350 MG/ML SOLN  COMPARISON:  Chest radiographs obtained earlier today. PET-CT dated 06/24/2013. Abdomen and pelvis CT  dated 07/07/2013.  FINDINGS: Multiple bilateral pulmonary arterial filling defects. These include the upper and lower lobes bilaterally and the right middle lobe. No large central pulmonary arterial filling defects are seen. No lung nodules or enlarged lymph nodes. Thoracic spine degenerative changes. Left adrenal mass measuring 2.5 x 2.0 cm on image number 80 and partially included smaller right adrenal mass measuring 1.4 x 0.8 cm on image number 85. These have not changed significantly since 07/07/2013. Diffuse gastric wall thickening is again noted.  Review of the MIP images confirms the above findings.  IMPRESSION: 1. Multiple small to moderate-sized bilateral pulmonary emboli. 2. Stable bilateral adrenal adenomas. 3. Persistent gastric wall thickening compatible with the patient's known gastric carcinoma. Critical Value/emergent results were called by telephone at the time of interpretation on 08/19/2013 at 12:10 AM to Dr.STEPHEN RANCOUR , who verbally acknowledged these results.   Electronically Signed   By: Gordan Payment M.D.   On: 08/19/2013 00:12   Assessment/Plan 72 yo female with recent gib from gastric adenocarcinoma now with multiple bilateral pulmonary emboli  Principal Problem:   Bilateral pulmonary embolism- her hgb is at her baseline, and with heme negative stool.  Will place on heparin gtt for now.  No evidence of any active bleeding since last discharge.  Will consult heme/onc also to help with deciding long term plan concerning anticoagulation vs ivc filter placement.    Active Problems:   HYPERTENSION   GASTRIC ULCER, HX OF   Adenocarcinoma of stomach   SOB (shortness of breath)  Pt wishes to be DNR.  She does not ever wish to have cpr or intubation in the future.   [name] 08/19/2013, 1:42 AM

## 2013-08-19 NOTE — Progress Notes (Signed)
[name], [name], [name], [name] on 07/07/2013 with gastric perforation; operated on and patched by Dr. Lovell Sheehan the same day on 07/07/2013. During surgery, Dr. Lovell Sheehan noted malignant appearing peritoneal implants that were biopsied and positive for metastatic disease. Started ECF chemotherapy on 08/02/2013.    Transfer to telemetry  Continue heparin infusion pending oncology recommendations (per discussion this morning he recommended against IVC filter).  We will consult GI to help coordinate care and for multi-disciplinary recommendations for anticoagulation followup.  Pending studies:   None  Code Status: DNR DVT prophylaxis: heparin infusion Family Communication: None present Disposition Plan: Home when improved  [name], Sharon  [facility]  Pager [phone] If 7PM-7AM, please contact night-coverage at [email or web address], password [place] 08/19/2013, 8:20 AM  LOS: 1 day   Summary: 72 year old woman with history of stage IV adenocarcinoma of the stomach with metastatic disease, recently hospitalized for melena and upper GI bleed related to cancer presented with shortness of breath without hypoxia or chest pain. Chest CT revealed multiple bilateral PE.  Consultants:  Oncology  Procedures:    HPI/Subjective: No issues overnight. No chest pain. Mild shortness of breath. No bleeding. Discussed with RN. No new issues.  Objective: Filed Vitals:   08/19/13 0200 08/19/13 0300 08/19/13 0400 08/19/13 0600  BP: 142/67  146/77 135/68  Pulse: 58 55 52   Temp:      TempSrc:      Resp: 18 13  9   Height:      Weight:      SpO2: 98% 100% 100%     Intake/Output Summary (Last 24 hours) at 08/19/13 0820 Last  data filed at 08/19/13 0600  Gross per 24 hour  Intake  28.13 ml  Output      0 ml  Net  28.13 ml     Filed Weights   08/18/13 2049  Weight: 86.274 kg (190 lb 3.2 oz)    Exam:   Afebrile, vital signs stable. No hypoxia.  General: Appears calm and comfortable.  Psychiatric: Grossly normal mood and affect. Speech fluent and appropriate.  Cardiovascular: Regular rate and rhythm. No murmur, rub or gallop. No significant lower extremity edema.  Respiratory: Clear to auscultation bilaterally. No wheezes, rales or rhonchi. Normal respiratory effort.  Data Reviewed:  Basic metabolic panel unremarkable  Transaminitis again noted, stable compared to previous studies  Troponin negative  Microcytic anemia appears stable without significant change today, 9.7. Chest CT revealed multiple small to moderate-sized bilateral pulmonary emboli  Scheduled Meds: . dexamethasone  4 mg Oral See admin instructions  . dicyclomine  20 mg Oral BID AC & HS  . diltiazem (CARDIZEM CD) 24 hr capsule 360 mg  360 mg Oral Q breakfast  . fluconazole  100 mg Oral Daily  . metoCLOPramide  10 mg Oral TID AC & HS  . metoprolol succinate  75 mg Oral Daily  . pantoprazole  40 mg Oral BID  . sodium chloride  3 mL Intravenous Q12H   Continuous Infusions: . heparin 1,250 Units/hr (08/19/13 0600)    Principal Problem:   Bilateral pulmonary embolism Active Problems:   HYPERTENSION   GASTRIC ULCER, HX OF   Adenocarcinoma of stomach   SOB (shortness of breath)   Time spent 25 minutes

## 2013-08-19 NOTE — Progress Notes (Signed)
ANTICOAGULATION CONSULT NOTE - Initial Consult  Pharmacy Consult for Heparin Indication: pulmonary embolus  No Known Allergies  Patient Measurements: Height: 5\' 6"  (167.6 cm) Weight: 190 lb 3.2 oz (86.274 kg) IBW/kg (Calculated) : 59.3 Heparin Dosing Weight: 77 kg  Vital Signs: Temp: 98.3 F (36.8 C) (11/13 0139) Temp src: Oral (11/13 0139) BP: 182/87 mmHg (11/13 0005) Pulse Rate: 64 (11/13 0005)  Labs:  Recent Labs  08/16/13 1258 08/18/13 2129  HGB 9.0* 10.0*  HCT 27.7* 30.6*  PLT 300 275  LABPROT  --  15.4*  INR  --  1.25  CREATININE  --  0.80  TROPONINI  --  <0.30    Estimated Creatinine Clearance: 69.3 ml/min (by C-G formula based on Cr of 0.8).   Medical History: Past Medical History  Diagnosis Date  . Hypertension   . Paroxysmal SVT (supraventricular tachycardia)   . Hypercholesteremia   . GERD (gastroesophageal reflux disease)   . PUD (peptic ulcer disease)   . DVT (deep venous thrombosis) 2007  . Adrenal adenoma     left  . Thyroid nodule     left  . H. pylori infection   . Diverticulosis   . 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 Diarrhea     recurrent  . Arthritis   . Hiatal hernia   . Cancer     adenocarcinoma    Medications:  Scheduled:  . dexamethasone  4 mg Oral See admin instructions  . dicyclomine  20 mg Oral BID AC & HS  . diltiazem  360 mg Oral Q breakfast  . fluconazole  100 mg Oral Daily  . metoCLOPramide  10 mg Oral TID AC & HS  . metoprolol succinate  75 mg Oral Daily  . pantoprazole  40 mg Oral BID  . sodium chloride  3 mL Intravenous Q12H   Prescriptions prior to admission  Medication Sig Dispense Refill  . dicyclomine (BENTYL) 20 MG tablet Take 20 mg by mouth 2 (two) times daily at 8 am and 10 pm.      . diltiazem (TIAZAC) 360 MG 24 hr capsule Take 360 mg by mouth every morning.       [name] Calcium (STOOL SOFTENER PO) Take 1 capsule by mouth 2 (two) times daily.      . fish  oil-omega-3 fatty acids 1000 MG capsule Take 1 g by mouth daily.      . fluconazole (DIFLUCAN) 100 MG tablet Take 1 tablet (100 mg total) by mouth daily.  14 tablet  0  . Linaclotide (LINZESS) 145 MCG CAPS capsule Take 145 mcg by mouth daily.      Marland Kitchen LORazepam (ATIVAN) 0.5 MG tablet Take 1 tablet (0.5 mg total) by mouth every 6 (six) hours as needed (nausea/vomiting).  30 tablet  0  . lovastatin (MEVACOR) 40 MG tablet Take 2 tablets (80 mg total) by mouth at bedtime.  60 tablet  11  . metoCLOPramide (REGLAN) 10 MG tablet Take 1 tablet (10 mg total) by mouth 4 (four) times daily.  100 tablet  3  . metoprolol succinate (TOPROL-XL) 25 MG 24 hr tablet Take 3 tablets (75 mg total) by mouth daily. Take with or immediately following a meal.  30 tablet  0  . ondansetron (ZOFRAN) 8 MG tablet Take 1 tablet (8 mg total) by mouth every 8 (eight) hours as needed for nausea or vomiting.  20 tablet  0  . oxyCODONE (OXY IR/ROXICODONE) 5  MG immediate release tablet Take 1 tablet (5 mg total) by mouth every 4 (four) hours as needed for pain.  60 tablet  0  . pantoprazole (PROTONIX) 40 MG tablet Take 1 tablet (40 mg total) by mouth 2 (two) times daily.  60 tablet  2  . Probiotic Product (PROBIOTIC FORMULA PO) Take 1 tablet by mouth every morning.      . prochlorperazine (COMPAZINE) 10 MG tablet Take 1 tablet (10 mg total) by mouth every 6 (six) hours.  100 tablet  3  . temazepam (RESTORIL) 7.5 MG capsule Take 7.5 mg by mouth at bedtime as needed for sleep.      Marland Kitchen dexamethasone (DECADRON) 4 MG tablet Take 4 mg by mouth See admin instructions.       . lidocaine-prilocaine (EMLA) cream Apply 1 application topically as needed (for port access).       . magnesium hydroxide (MILK OF MAGNESIA) 400 MG/5ML suspension Take 30 mLs by mouth daily as needed for constipation.       Assessment: Okay for Protocol, 72 yo female h/o stage 4 adenocarcinoma gastric with recent melena and ugib from cancer 2 weeks ago. She was heme neg per  report on EDP rectal exam. Her hgb has been stable since her gib.  Goal of Therapy:  Heparin level 0.3-0.7 units/ml Monitor platelets by anticoagulation protocol: Yes   Plan:  Give 2500 units bolus x 1 Start heparin infusion at 1250 units/hr Check anti-Xa level in 6-8 hours and daily while on heparin Continue to monitor H&H and platelets  Mady Gemma 08/19/2013,3:01 AM

## 2013-08-19 NOTE — Consult Note (Signed)
Referring Provider: Standley Brooking, MD Primary Care Physician:  Evlyn Courier, MD Primary Gastroenterologist:  Jonette Eva, MD   Reason for Consultation:  Recent GI bleed and need for anticoagulation due to new PE  HPI: [name] is a 72 y.o. female with h/o stage IV gastric cancer, started chemo 07/29/13. Seen 08/07/13 during hospitalization for melena (Hgb 6.4). Underwent exploratory laparotomy/gastrorrhaphy for gastric perforation 07/07/13. When she presented with bleeding last month, Dr. Darrick Penna discussed with Dr. Donnal Debar at [facility] who did not recommend APC. Consideration of IR mentioned. Surgery did not recommend surgery.   She presented yesterday with complaints of SOB, acute onset. No melena since in the hospital last week. CTA chest showed multiple bilateral PEs. No lower extremity dopplers at this time. Hgb stable since discharge last week.    Prior to Admission medications   Medication Sig Start Date End Date Taking? Authorizing Provider  dexamethasone (DECADRON) 4 MG tablet Take 8 mg by mouth See admin instructions. 2 take tablets once daily on the day after chemo and then take 2 tablets twice daily for the next two days.   Yes Historical Provider, MD  dicyclomine (BENTYL) 20 MG tablet Take 20 mg by mouth 2 (two) times daily at 8 am and 10 pm.   Yes Historical Provider, MD  diltiazem (TIAZAC) 360 MG 24 hr capsule Take 360 mg by mouth every morning.  05/01/12  Yes Gaylord Shih, MD  Docusate Calcium (STOOL SOFTENER PO) Take 1 capsule by mouth 2 (two) times daily.   Yes Historical Provider, MD  fish oil-omega-3 fatty acids 1000 MG capsule Take 1 g by mouth daily.   Yes Historical Provider, MD  fluconazole (DIFLUCAN) 100 MG tablet Take 1 tablet (100 mg total) by mouth daily. 08/10/13  Yes Jerald Kief, MD  Linaclotide [facility]) 145 MCG CAPS capsule Take 145 mcg by mouth daily.   Yes Historical Provider, MD  LORazepam (ATIVAN) 0.5 MG tablet Take 1 tablet (0.5 mg total) by mouth every 6  (six) hours as needed (nausea/vomiting). 08/10/13  Yes Jerald Kief, MD  lovastatin (MEVACOR) 40 MG tablet Take 2 tablets (80 mg total) by mouth at bedtime. 11/11/12  Yes Gaylord Shih, MD  metoCLOPramide (REGLAN) 10 MG tablet Take 1 tablet (10 mg total) by mouth 4 (four) times daily. 07/15/13  Yes Dalia Heading, MD  metoprolol succinate (TOPROL-XL) 25 MG 24 hr tablet Take 3 tablets (75 mg total) by mouth daily. Take with or immediately following a meal. 08/10/13  Yes Jerald Kief, MD  ondansetron (ZOFRAN) 8 MG tablet Take 1 tablet (8 mg total) by mouth every 8 (eight) hours as needed for nausea or vomiting. 08/10/13  Yes Jerald Kief, MD  oxyCODONE (OXY IR/ROXICODONE) 5 MG immediate release tablet Take 1 tablet (5 mg total) by mouth every 4 (four) hours as needed for pain. 07/27/13  Yes Maurine Minister Kefalas, PA-C  pantoprazole (PROTONIX) 40 MG tablet Take 1 tablet (40 mg total) by mouth 2 (two) times daily. 07/15/13  Yes Dalia Heading, MD  Probiotic Product (PROBIOTIC FORMULA PO) Take 1 tablet by mouth every morning.   Yes Historical Provider, MD  prochlorperazine (COMPAZINE) 10 MG tablet Take 1 tablet (10 mg total) by mouth every 6 (six) hours. 08/13/13  Yes Maurine Minister Kefalas, PA-C  temazepam (RESTORIL) 7.5 MG capsule Take 7.5 mg by mouth at bedtime as needed for sleep.   Yes Historical Provider, MD  dexamethasone (DECADRON) 4 MG tablet Take  4 mg by mouth See admin instructions.     Historical Provider, MD  lidocaine-prilocaine (EMLA) cream Apply 1 application topically as needed (for port access).     Historical Provider, MD  magnesium hydroxide (MILK OF MAGNESIA) 400 MG/5ML suspension Take 30 mLs by mouth daily as needed for constipation.    Historical Provider, MD    Current Facility-Administered Medications  Medication Dose Route Frequency Provider Last Rate Last Dose  . 0.9 %  sodium chloride infusion  250 mL Intravenous PRN Haydee Monica, MD      . dicyclomine (BENTYL) capsule 20 mg  20 mg Oral  BID AC & HS Haydee Monica, MD   20 mg at 08/19/13 0750  . diltiazem (CARDIZEM CD) 24 hr capsule 360 mg  360 mg Oral Q breakfast Haydee Monica, MD   360 mg at 08/19/13 0749  . fluconazole (DIFLUCAN) tablet 100 mg  100 mg Oral Daily Haydee Monica, MD   100 mg at 08/19/13 0959  . heparin ADULT infusion 100 units/mL (25000 units/250 mL)  950 Units/hr Intravenous Continuous [facility], RPH 12.5 mL/hr at 08/19/13 0800 1,250 Units/hr at 08/19/13 0800  . lidocaine-prilocaine (EMLA) cream 1 application  1 application Topical PRN Haydee Monica, MD      . Linaclotide [facility]) capsule 145 mcg  145 mcg Oral Daily Standley Brooking, MD      . LORazepam (ATIVAN) tablet 0.5 mg  0.5 mg Oral Q6H PRN Haydee Monica, MD   0.5 mg at 08/19/13 0255  . metoCLOPramide (REGLAN) tablet 10 mg  10 mg Oral TID AC & HS Haydee Monica, MD   10 mg at 08/19/13 1154  . metoprolol succinate (TOPROL-XL) 24 hr tablet 75 mg  75 mg Oral Daily Haydee Monica, MD      . ondansetron [facility]) tablet 8 mg  8 mg Oral Q8H PRN Haydee Monica, MD      . oxyCODONE (Oxy IR/ROXICODONE) immediate release tablet 5 mg  5 mg Oral Q4H PRN Standley Brooking, MD      . pantoprazole (PROTONIX) EC tablet 40 mg  40 mg Oral BID Haydee Monica, MD   40 mg at 08/19/13 0959  . prochlorperazine (COMPAZINE) tablet 10 mg  10 mg Oral Q6H PRN Haydee Monica, MD   10 mg at 08/19/13 0255  . sodium chloride 0.9 % injection 3 mL  3 mL Intravenous Q12H Rachal A Onalee Hua, MD      . sodium chloride 0.9 % injection 3 mL  3 mL Intravenous PRN Haydee Monica, MD      . zolpidem (AMBIEN) tablet 5 mg  5 mg Oral QHS PRN Haydee Monica, MD   5 mg at 08/19/13 0255    Allergies as of 08/18/2013  . (No Known Allergies)    Past Medical History  Diagnosis Date  . Hypertension   . Paroxysmal SVT (supraventricular tachycardia)   . Hypercholesteremia   . GERD (gastroesophageal reflux disease)   . PUD (peptic ulcer disease)   . DVT (deep venous thrombosis) 2007   . Adrenal adenoma     left  . Thyroid nodule     left  . H. pylori infection   . Diverticulosis   . 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 Diarrhea     recurrent  . Arthritis   . Hiatal hernia   . Cancer  adenocarcinoma    Past Surgical History  Procedure Laterality Date  . Total knee arthroplasty  bilateral knee  . Hip  replacement  left  . Appendectomy    . Cholecystectomy    . Abdominal hysterectomy    . Breast lumpectomy      left  . Ablation      of SVT  . Cardiac catheterization  2009    normal coronary arteries  . Colonoscopy  09/13/2009    ZOX:[id number] internal hemorrhoids  . Esophagogastroduodenoscopy  09/13/2009    FAO:ZHYQMV/  . Esophagogastroduodenoscopy N/A 06/11/2013    Procedure: ESOPHAGOGASTRODUODENOSCOPY (EGD);  Surgeon: West Bali, MD;  Location: [facility];  Service: Endoscopy;  Laterality: N/A;  2:15-moved to 1045 [name] notified pt  . Portacath placement Left 07/06/2013    Procedure: INSERTION PORT-A-CATH;  Surgeon: Almond Lint, MD;  Location: [facility] ORS;  Service: General;  Laterality: Left;  . Laparotomy N/A 07/07/2013    Procedure: EXPLORATORY LAPAROTOMY;  Surgeon: Dalia Heading, MD;  Location: [facility] ORS;  Service: General;  Laterality: N/A;  . Gastrorrhaphy N/A 07/07/2013    Procedure: Ferrel Logan;  Surgeon: Dalia Heading, MD;  Location: [facility] ORS;  Service: General;  Laterality: N/A;    Family History  Problem Relation Age of Onset  . Colon cancer Neg Hx   . Cancer Mother     leukemia  . Cancer Sister     breast  . Heart disease Brother   . Heart disease Sister     History   Social History  . Marital Status: Widowed    Spouse Name: N/A    Number of Children: N/A  . Years of Education: N/A   Occupational History  . Not on file.   Social History Main Topics  . Smoking status: Former Smoker -- 15 years  . Smokeless tobacco: Never Used     Comment: Quit smoking x 3 weeks  .  Alcohol Use: No  . Drug Use: No  . Sexual Activity: Not Currently   Other Topics Concern  . Not on file   Social History Narrative   HUSBAND PASSES MAR 2014. EATS OUT A LOT.     ROS:  General: c/o poor appetite, fatigue. No fever. Eyes: Negative for vision changes.  ENT: Negative for hoarseness, difficulty swallowing , nasal congestion. CV: Negative for chest pain, angina, palpitations. + Dyspnea on exertion, peripheral edema.  Respiratory: Negative for dyspnea at rest, cough, sputum, wheezing. +DOE and with talking. GI: See history of present illness. GU:  Negative for dysuria, hematuria, urinary incontinence, urinary frequency, nocturnal urination.  MS: Negative for joint pain, low back pain.  Derm: Negative for rash or itching.  Neuro: Negative for weakness, abnormal sensation, seizure, frequent headaches, memory loss, confusion.  Psych: Negative for anxiety, depression, suicidal ideation, hallucinations.  Endo: Negative for unusual weight change.  Heme: Negative for bruising or bleeding. Allergy: Negative for rash or hives.       Physical Examination: Vital signs in last 24 hours: Temp:  [98 F (36.7 C)-98.6 F (37 C)] 98.6 F (37 C) (11/13 1157) Pulse Rate:  [52-66] 59 (11/13 1000) Resp:  [9-18] 13 (11/13 1000) BP: (135-182)/(59-87) 149/77 mmHg (11/13 1000) SpO2:  [97 %-100 %] 97 % (11/13 1000) Weight:  [190 lb 3.2 oz (86.274 kg)] 190 lb 3.2 oz (86.274 kg) (11/12 2049) Last BM Date: 08/19/13  General: Well-nourished, well-developed in no acute distress.  Head: Normocephalic, atraumatic.   Eyes: Conjunctiva pink, no icterus. Mouth: Oropharyngeal  mucosa moist and pink , no lesions erythema or exudate. Neck: Supple without thyromegaly, masses, or lymphadenopathy.  Lungs: Clear to auscultation bilaterally.  Heart: Regular rate and rhythm, no murmurs rubs or gallops.  Abdomen: Bowel sounds are normal, nontender, nondistended, no hepatosplenomegaly or masses, no  abdominal bruits or    hernia , no rebound or guarding.   Rectal: not performed Extremities: No lower extremity edema, clubbing, deformity.  Neuro: Alert and oriented x 4 , grossly normal neurologically.  Skin: Warm and dry, no rash or jaundice.   Psych: Alert and cooperative, normal mood and affect.        Intake/Output from previous day: 11/12 0701 - 11/13 0700 In: 280.6 [P.O.:240; I.V.:40.6] Out: -  Intake/Output this shift: Total I/O In: 12.5 [I.V.:12.5] Out: -   Lab Results: CBC  Recent Labs  08/18/13 2129 08/19/13 0308  WBC 4.5  --   HGB 10.0* 9.7*  HCT 30.6* 29.6*  MCV 77.9*  --   PLT 275  --    BMET  Recent Labs  08/18/13 2129 08/19/13 0308  NA 134* 134*  K 4.0 4.1  CL 96 95*  CO2 26 28  GLUCOSE 172* 182*  BUN 19 18  CREATININE 0.80 0.76  CALCIUM 9.7 9.6   LFT  Recent Labs  08/18/13 2129  BILITOT 0.3  ALKPHOS 78  AST 69*  ALT 255*  PROT 6.9  ALBUMIN 3.1*     PT/INR  Recent Labs  08/18/13 2129 08/19/13 0308  LABPROT 15.4* 15.8*  INR 1.25 1.29      Imaging Studies: Dg Chest 2 View  08/18/2013   CLINICAL DATA:  Shortness of breath. History of gastric cancer. Ex-smoker.  EXAM: CHEST  2 VIEW  COMPARISON:  08/06/2013.  FINDINGS: The cardiac silhouette remains near the upper limit of normal in size. Clear lungs. Stable left subclavian porta catheter. Thoracic spine degenerative changes. Mild bilateral shoulder degenerative changes.  IMPRESSION: No acute abnormality.   Electronically Signed   By: Gordan Payment M.D.   On: 08/18/2013 22:08   Ct Head Wo Contrast  08/04/2013   CLINICAL DATA:  Nausea and headache.  On chemotherapy.  EXAM: CT HEAD WITHOUT CONTRAST  TECHNIQUE: Contiguous axial images were obtained from the base of the skull through the vertex without intravenous contrast.  COMPARISON:  None available for comparison at time of study interpretation.  FINDINGS: The ventricles and sulci are normal for age. No intraparenchymal hemorrhage,  mass effect nor midline shift. Patchy supratentorial white matter hypodensities are within normal range for patient's age and though non-specific suggest sequelae of chronic small vessel ischemic disease. No acute large vascular territory infarcts.  No abnormal extra-axial fluid collections. Basal cisterns are patent. Moderate calcific atherosclerosis of the carotid siphons.  No skull fracture. Visualized paranasal sinuses and mastoid aircells are well-aerated. The included ocular globes and orbital contents are non-suspicious.  IMPRESSION: No acute intracranial process ; normal noncontrast CT of the head for age.   Electronically Signed   By: Awilda Metro   On: 08/04/2013 03:04   Ct Angio Chest Pe W/cm &/or Wo Cm  08/19/2013   CLINICAL DATA:  Shortness of breath. Undergoing chemotherapy for gastric carcinoma.  EXAM: CT ANGIOGRAPHY CHEST WITH CONTRAST  TECHNIQUE: Multidetector CT imaging of the chest was performed using the standard protocol during bolus administration of intravenous contrast. Multiplanar CT image reconstructions including MIPs were obtained to evaluate the vascular anatomy.  CONTRAST:  OMNIPAQUE IOHEXOL 350 MG/ML SOLN  COMPARISON:  Chest radiographs obtained earlier today. PET-CT dated 06/24/2013. Abdomen and pelvis CT dated 07/07/2013.  FINDINGS: Multiple bilateral pulmonary arterial filling defects. These include the upper and lower lobes bilaterally and the right middle lobe. No large central pulmonary arterial filling defects are seen. No lung nodules or enlarged lymph nodes. Thoracic spine degenerative changes. Left adrenal mass measuring 2.5 x 2.0 cm on image number 80 and partially included smaller right adrenal mass measuring 1.4 x 0.8 cm on image number 85. These have not changed significantly since 07/07/2013. Diffuse gastric wall thickening is again noted.  Review of the MIP images confirms the above findings.  IMPRESSION: 1. Multiple small to moderate-sized bilateral  pulmonary emboli. 2. Stable bilateral adrenal adenomas. 3. Persistent gastric wall thickening compatible with the patient's known gastric carcinoma. Critical Value/emergent results were called by telephone at the time of interpretation on 08/19/2013 at 12:10 AM to Dr.STEPHEN RANCOUR , who verbally acknowledged these results.   Electronically Signed   By: Gordan Payment M.D.   On: 08/19/2013 00:12   Dg Abd Acute W/chest  08/06/2013   CLINICAL DATA:  History of bleeding ulcer  EXAM: ACUTE ABDOMEN SERIES (ABDOMEN 2 VIEW & CHEST 1 VIEW)  COMPARISON:  None.  FINDINGS: Cardiac shadow is within normal limits. A left-sided chest wall port is seen with the catheter tip in the proximal superior vena cava. Lungs are clear bilaterally.  The abdomen shows a nonobstructive bowel gas pattern. Degenerative changes of the lumbar spine are seen. No abnormal mass or abnormal calcifications are noted. A left hip replacement is seen.  IMPRESSION: No acute abnormality noted.   Electronically Signed   By: Alcide Clever M.D.   On: 08/06/2013 16:43  [4 week]   Impression/Recommendations: 72 y/o female with stage IV gastric cancer currently on chemo, recent GI bleed felt to be from gastric tumor who presents with acute bilateral PEs. She has stable elevation of AST/ALT with no evidence of hepatic mets on recent CT.   Patient has not seen any evidence of overt GI bleeding in the past week. She is at risk of ongoing bleeding from the tumor with and without anticoagulation. Difficult situation as bilateral PEs need to be treated. Would be concerned about undetected DVTs and need for possible IVC placement.   Unfortunately, APC treatment of gastric tumor would not be recommended. Not likely effective. It is not clear if interventional radiology would be able to perform embolization or not. To discuss with Dr. Jena Gauss.      LOS: 1 day   Tana Coast  08/19/2013, 1:30 PM  Attending note: Patient seen and examined. Discussed with  patient and son -  Also discussed with Dr. Irene Limbo and Dr. Elmon Kirschner, interventional radiologist in [place].  Complex clinical situation. If patient's ulcerated gastric cancer were to begin briskly bleeding, endoscopic therapy will be largely ineffective given the nature of neoplastic tissue. An alternative approach would be for interventional radiology to embolize the gastroduodenal artery to globally diminished gastric mucosal blood flow.  A Greenfield filter has been offered by Dr. Elmon Kirschner to help prevent further pulmonary emboli. Lovenox therapy may be a better choice than Coumadin in this clinical setting.  Would continue PPI therapy indefinitely.  Will continue to follow peripherally.

## 2013-08-20 DIAGNOSIS — I82409 Acute embolism and thrombosis of unspecified deep veins of unspecified lower extremity: Secondary | ICD-10-CM

## 2013-08-20 LAB — CBC
HCT: 28.2 % — ABNORMAL LOW (ref 36.0–46.0)
Hemoglobin: 9.3 g/dL — ABNORMAL LOW (ref 12.0–15.0)
MCHC: 33 g/dL (ref 30.0–36.0)
MCV: 78.6 fL (ref 78.0–100.0)
RDW: 18.8 % — ABNORMAL HIGH (ref 11.5–15.5)
WBC: 4 10*3/uL (ref 4.0–10.5)

## 2013-08-20 MED ORDER — ENOXAPARIN SODIUM 100 MG/ML ~~LOC~~ SOLN
1.0000 mg/kg | Freq: Once | SUBCUTANEOUS | Status: AC
  Administered 2013-08-20: 85 mg via SUBCUTANEOUS
  Filled 2013-08-20: qty 1

## 2013-08-20 MED ORDER — ENOXAPARIN SODIUM 100 MG/ML ~~LOC~~ SOLN: 1.0000 mg/kg | Freq: Two times a day (BID) | SUBCUTANEOUS | Status: DC

## 2013-08-20 NOTE — Progress Notes (Signed)
Subjective: [name] is a 72 years old who was diagnosed with Stage IV Gastric cancer following perforation of stomach on 07/07/2013 with repair by Dr. Lovell Sheehan and biopsy-proven metastatic peritoneal lesion.,S/pC1, Day#18 ECF chemotherapy.  Now with newly diagnosed multiple small PE's and Left LE DVT. Patient 's SOB better. Denies any nosebleed blood in the stool blood in the urine. Denies any lower extremity pain or swelling denies any chest pains.   Objective: Vital signs in last 24 hours: Temp:  [97.8 F (36.6 C)-98.2 F (36.8 C)] 98.1 F (36.7 C) (11/14 2010) Pulse Rate:  [51-62] 51 (11/14 2010) Resp:  [19-20] 20 (11/14 2010) BP: (113-146)/(71-82) 137/74 mmHg (11/14 2010) SpO2:  [98 %] 98 % (11/14 2010)  PHYSICAL EXAMINATION: HEENT PERRLA,  sclera anicteric,  head- atraumatic normocephalic,  neck -supple, no JVD, no thyromegaly Mouth moist mucous membranes  Respiratory system clear to auscultation no rales CVS S1-S2 normal intensity no murmurs Abdomen soft normoactive bowel sounds Extremities no edema no cyanosis CNS alert and oriented x3 no focal deficits Psychological normal mood and affect Skin no rashes found    Lab Results:   Recent Labs  08/18/13 2129 08/19/13 0308 08/20/13 0441  WBC 4.5  --  4.0  HGB 10.0* 9.7* 9.3*  HCT 30.6* 29.6* 28.2*  PLT 275  --  287   BMET  Recent Labs  08/18/13 2129 08/19/13 0308  NA 134* 134*  K 4.0 4.1  CL 96 95*  CO2 26 28  GLUCOSE 172* 182*  BUN 19 18  CREATININE 0.80 0.76  CALCIUM 9.7 9.6    Studies/Results: Ct Angio Chest Pe W/cm &/or Wo Cm  08/19/2013   CLINICAL DATA:  Shortness of breath. Undergoing chemotherapy for gastric carcinoma.  EXAM: CT ANGIOGRAPHY CHEST WITH CONTRAST  TECHNIQUE: Multidetector CT imaging of the chest was performed using the standard protocol during bolus administration of intravenous contrast. Multiplanar CT image reconstructions including MIPs were obtained to evaluate the vascular  anatomy.  CONTRAST:  OMNIPAQUE IOHEXOL 350 MG/ML SOLN  COMPARISON:  Chest radiographs obtained earlier today. PET-CT dated 06/24/2013. Abdomen and pelvis CT dated 07/07/2013.  FINDINGS: Multiple bilateral pulmonary arterial filling defects. These include the upper and lower lobes bilaterally and the right middle lobe. No large central pulmonary arterial filling defects are seen. No lung nodules or enlarged lymph nodes. Thoracic spine degenerative changes. Left adrenal mass measuring 2.5 x 2.0 cm on image number 80 and partially included smaller right adrenal mass measuring 1.4 x 0.8 cm on image number 85. These have not changed significantly since 07/07/2013. Diffuse gastric wall thickening is again noted.  Review of the MIP images confirms the above findings.  IMPRESSION: 1. Multiple small to moderate-sized bilateral pulmonary emboli. 2. Stable bilateral adrenal adenomas. 3. Persistent gastric wall thickening compatible with the patient's known gastric carcinoma. Critical Value/emergent results were called by telephone at the time of interpretation on 08/19/2013 at 12:10 AM to Dr.STEPHEN RANCOUR , who verbally acknowledged these results.   Electronically Signed   By: Gordan Payment M.D.   On: 08/19/2013 00:12   US Venous Img Lower Bilateral  08/19/2013   CLINICAL DATA:  Leg pain.  EXAM: VENOUS DOPPLER ULTRASOUND OF BILATERAL LOWER EXTREMITIES  TECHNIQUE: Gray-scale sonography with graded compression, as well as color Doppler and duplex ultrasound, were performed to evaluate the deep venous system from the level of the common femoral vein through the popliteal and proximal calf veins. Spectral Doppler was utilized to evaluate flow at rest and  with distal augmentation maneuvers.  COMPARISON:  None.  FINDINGS: Thrombus within deep veins: There is nonocclusive deep venous thrombosis of the distal superficial femoral vein and popliteal vein of the left leg. No deep venous thrombosis of the right leg.   Compressibility of deep veins: The area of DVT of the left leg is not compressible. The deep veins of the right leg are compressible.  Duplex waveform respiratory phasicity: Normal on the right. Abnormal at the site of DVT on the left.  Duplex waveform response to augmentation:  Normal on the right.  Venous reflux:  None visualized.  Other findings:  Subcutaneous edema noted in the left leg.  IMPRESSION: Nonocclusive deep venous thrombosis of the distal left superficial femoral vein and left popliteal vein. Normal deep veins of the right leg.   Electronically Signed   By: Geanie Cooley M.D.   On: 08/19/2013 18:27     Assessment/Plan: 1. Newly diagnosed bilateral small-moderate sized PE and left lower extremity DVT in a patient with Stage IV GastricCancer with increased risk of recurrent GI bleed from Gastric Cancer:  Continue Lovenox 1 mg /kg body weight subcutaneously twice daily. Agree with IVC filter placement in view of high risk of recurrent bleed. Discussed in detail with the patient and the patient's sister high risk of bleeding with anticoagulation and also the complications of  acute PE including death. Patient understood the same and she would like to continue anticoagulation  2. Stage IV Gastric Cancer, s/p Cycle 1, Day 18 of Chemotherapy with ECF. Second opinion appointment at [facility] is scheduled on 08/24/2013.  4. Recent GI bleed most likely from Gastric cancer: closely monitor Hb and HCT.recommend PRBC transfusion for anemia when indicated. If any evidence of major bleed, recommend D/C anticoagulation 5. Transaminitis, unknown etiology presently. Followed by GI. ?5FU hepatoxicity?     Annamarie Dawley, MD  @TODAY @ 10:27 PM

## 2013-08-20 NOTE — Progress Notes (Addendum)
[facility] PROGRESS NOTE  [name] ZOX:[id number] DOB: November 04, 1940 DOA: 08/18/2013 PCP: Evlyn Courier, MD  Addendum: Discussed with Dr. Brantley Persons recommends IVC filter placement and continue Lovenox on discharge. Will consult IR.  Assessment/Plan: 1. Acute multiple bilateral PE 2. Acute left lower extremity DVT 3. Reason GI bleed with acute blood loss anemia: Hemoglobin stable. No evidence of recurrent bleeding. 4. Stage IV (T4b, Nx, M1) adenocarcinoma of stomach: presented to [facility] on 07/07/2013 with gastric perforation; operated on and patched by Dr. Lovell Sheehan the same day on 07/07/2013. During surgery, Dr. Lovell Sheehan noted malignant appearing peritoneal implants that were biopsied and positive for metastatic disease. Chemotherapy currently on hold. Has appointment at [facility] next week for second opinion.    For now will continue Lovenox as recommended by oncology. Case discussed with gastroenterology and recommendations noted. Oncology will reevaluate today for consideration of IVC filter placement. Difficult situation in that recurrent GI bleeding and not likely be addressed by endoscopy, however IVC filter placement will not treat current PE. Await final oncology recommendations, specifically whether to proceed with IVC filter placement. Need to discuss risk/benefit with family for long-term anticoagulation versus no pharmacologic therapy.  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 Chest CT revealed multiple bilateral PE.  Consultants:  Oncology  Procedures:    HPI/Subjective: Overall doing okay. Had some shortness of breath last night. Poor appetite. No new issues. No chest pain.  Objective: Filed Vitals:   08/19/13 1157 08/19/13 1457 08/19/13 2212 08/20/13 0625  BP:  157/85 109/52 146/82  Pulse:  65 60 61  Temp: 98.6 F (37 C) 97.4 F (36.3 C) 97.6 F (36.4 C) 98.2 F (36.8 C)  TempSrc:  Oral Oral Oral  Resp:  18 20 19   Height:      Weight:      SpO2:  98% 98% 98%    Intake/Output Summary (Last 24 hours) at 08/20/13 1237 Last data filed at 08/20/13 0900  Gross per 24 hour  Intake    240 ml  Output      0 ml  Net    240 ml     Filed Weights   08/18/13 2049  Weight: 86.274 kg (190 lb 3.2 oz)    Exam:   Afebrile, vital signs stable. No hypoxia. Lying flat in bed.  General: Appears calm and comfortable. Speech is fluent and clear.  Cardiovascular: Regular rate and rhythm. No murmur, rub or gallop.  Respiratory: Clear to auscultation bilaterally. No wheezes, rales or rhonchi. Normal respiratory effort.  Bilateral lower extremities, no significant edema. Nontender.  Data Reviewed:  Hemoglobin stable 9.3.  Left lower extremity DVT on venous sonography.  Scheduled Meds: . dicyclomine  20 mg Oral BID AC & HS  . diltiazem (CARDIZEM CD) 24 hr capsule 360 mg  360 mg Oral Q breakfast  . enoxaparin (LOVENOX) injection  1 mg/kg Subcutaneous Q12H  . escitalopram  10 mg Oral Daily  . fluconazole  100 mg Oral Daily  . Linaclotide  145 mcg Oral Daily  .  metoCLOPramide  10 mg Oral TID AC & HS  . metoprolol succinate  75 mg Oral Daily  . pantoprazole  40 mg Oral BID  . sodium chloride  3 mL Intravenous Q12H   Continuous Infusions:    Principal Problem:   Bilateral pulmonary embolism Active Problems:   HYPERTENSION   GASTRIC ULCER, HX OF   Adenocarcinoma of stomach   SOB (shortness of breath)   Time spent  25 minutes

## 2013-08-21 ENCOUNTER — Ambulatory Visit (HOSPITAL_COMMUNITY)
Admit: 2013-08-21 | Discharge: 2013-08-21 | Disposition: A | Discharge status: Home / Self Care | Payer: Medicare Other | Attending: Family Medicine | Admitting: Family Medicine

## 2013-08-21 ENCOUNTER — Encounter (HOSPITAL_COMMUNITY): Payer: Self-pay

## 2013-08-21 DIAGNOSIS — I2699 Other pulmonary embolism without acute cor pulmonale: Secondary | ICD-10-CM | POA: Diagnosis not present

## 2013-08-21 DIAGNOSIS — R0602 Shortness of breath: Secondary | ICD-10-CM | POA: Diagnosis not present

## 2013-08-21 HISTORY — PX: VENA CAVA FILTER PLACEMENT: SHX1085

## 2013-08-21 MED ORDER — ENOXAPARIN SODIUM 100 MG/ML ~~LOC~~ SOLN
1.0000 mg/kg | Freq: Two times a day (BID) | SUBCUTANEOUS | Status: DC
  Administered 2013-08-21 – 2013-08-22 (×2): 85 mg via SUBCUTANEOUS
  Filled 2013-08-21 (×2): qty 1

## 2013-08-21 MED ORDER — FENTANYL CITRATE 0.05 MG/ML IJ SOLN
INTRAMUSCULAR | Status: AC | PRN
  Administered 2013-08-21: 50 ug via INTRAVENOUS

## 2013-08-21 MED ORDER — IOHEXOL 300 MG/ML  SOLN
100.0000 mL | Freq: Once | INTRAMUSCULAR | Status: AC | PRN
  Administered 2013-08-21: 45 mL via INTRAVENOUS

## 2013-08-21 MED ORDER — FENTANYL CITRATE 0.05 MG/ML IJ SOLN
INTRAMUSCULAR | Status: AC
  Filled 2013-08-21: qty 2

## 2013-08-21 MED ORDER — MIDAZOLAM HCL 2 MG/2ML IJ SOLN
INTRAMUSCULAR | Status: AC
  Filled 2013-08-21: qty 2

## 2013-08-21 MED ORDER — MIDAZOLAM HCL 2 MG/2ML IJ SOLN
INTRAMUSCULAR | Status: AC | PRN
  Administered 2013-08-21: 1 mg via INTRAVENOUS

## 2013-08-21 NOTE — Progress Notes (Addendum)
[facility] PROGRESS NOTE  [name] ZOX:[id number] DOB: 1941-05-03 DOA: 08/18/2013 PCP: Evlyn Courier, MD  Assessment/Plan: 1. Acute multiple bilateral PE: Remains hemodynamically stable. No hypoxia. Continue Lovenox for oncology. Monitor for bleeding. 2. Acute left lower extremity DVT: Continue Lovenox. Now status post IVC filter placement. 3. Recent GI bleed with acute blood loss anemia: Hemoglobin stable. No evidence of recurrent bleeding. 4. Stage IV (T4b, Nx, M1) adenocarcinoma of stomach: presented to [facility] on 07/07/2013 with gastric perforation; operated on and patched by Dr. Lovell Sheehan the same day on 07/07/2013. During surgery, Dr. Lovell Sheehan noted malignant appearing peritoneal implants that were biopsied and positive for metastatic disease. Chemotherapy currently on hold. Has appointment at [facility] next week for second opinion.    Monitor overnight. If no bleeding or other complications would anticipate discharge 11/16. This was discussed with the son.  Pending studies:   None  Code Status: DNR DVT prophylaxis: Lovenox Family Communication: Discussed with son at bedside. He confirms continued anticoagulation. 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 Chest CT revealed multiple bilateral PE.  Consultants:  Oncology  Procedures:    HPI/Subjective: Status post IVC filter placement. No chest pain or shortness of breath. No complaints.  Objective: Filed Vitals:   08/20/13 0625 08/20/13 1555 08/20/13 2010 08/21/13 0426  BP: 146/82 113/71 137/74 134/75   Pulse: 61 62 51 59  Temp: 98.2 F (36.8 C) 97.8 F (36.6 C) 98.1 F (36.7 C) 98 F (36.7 C)  TempSrc: Oral Oral Oral Oral  Resp: 19 20 20 20   Height:      Weight:      SpO2: 98% 98% 98% 99%    Intake/Output Summary (Last 24 hours) at 08/21/13 1243 Last data filed at 08/20/13 1730  Gross per 24 hour  Intake    240 ml  Output      0 ml  Net    240 ml     Filed Weights   08/18/13 2049  Weight: 86.274 kg (190 lb 3.2 oz)    Exam:   Afebrile, vital signs stable. No hypoxia.  General: Appears calm and comfortable.  Cardiovascular: Regular rate and rhythm. No murmur, rub or gallop. No significant lower extremity edema.  Respiratory: Clear to auscultation bilaterally. No wheezes, rales or rhonchi. Respiratory effort is normal.  Psychiatric: Grossly normal mood and affect. Speech fluent and appropriate.  Data Reviewed:  No new data  Scheduled Meds: . dicyclomine  20 mg Oral BID AC & HS  . diltiazem (CARDIZEM CD) 24 hr capsule 360 mg  360 mg Oral Q breakfast  . enoxaparin (LOVENOX) injection  1 mg/kg Subcutaneous Q12H  . escitalopram  10 mg Oral Daily  . fluconazole  100 mg Oral Daily  . Linaclotide  145 mcg Oral Daily  . metoCLOPramide  10 mg Oral TID AC & HS  . metoprolol succinate  75 mg Oral Daily  . pantoprazole  40 mg Oral BID  . sodium chloride  3 mL Intravenous Q12H   Continuous Infusions:    Principal Problem:   Bilateral pulmonary embolism Active Problems:   HYPERTENSION   GASTRIC ULCER, HX  OF   Adenocarcinoma of stomach   SOB (shortness of breath)   DVT, lower extremity   Time spent 20 minutes

## 2013-08-21 NOTE — Procedures (Signed)
Successful IVC FILTER INSERTION NO COMP STABLE FULL REPORT IN PACS  

## 2013-08-21 NOTE — H&P (Signed)
Referring Physician: Dr. Irene Limbo HPI: [name] is an 72 y.o. female with PMHx of gastric cancer and recent GI bleed. The patient denies any active bleeding, blood in her stool or urine. She recently was admitted to [facility] for shortness of breath complaint and found to have LLE DVT and bilateral pulmonary emboli and is unable to be anticoagulated given concern for recurrence of GI bleed with a history of gastric cancer. She denies any active chest pain or shortness of breath. She denies any difficulty with previous sedation or iodinated contrast. She denies any fever or chills.   Past Medical History:  Past Medical History  Diagnosis Date  . Hypertension   . Paroxysmal SVT (supraventricular tachycardia)   . Hypercholesteremia   . GERD (gastroesophageal reflux disease)   . PUD (peptic ulcer disease)   . DVT (deep venous thrombosis) 2007  . Adrenal adenoma     left  . Thyroid nodule     left  . H. pylori infection   . Diverticulosis   . 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 Diarrhea     recurrent  . Arthritis   . Hiatal hernia   . Cancer     adenocarcinoma    Past Surgical History:  Past Surgical History  Procedure Laterality Date  . Total knee arthroplasty  bilateral knee  . Hip  replacement  left  . Appendectomy    . Cholecystectomy    . Abdominal hysterectomy    . Breast lumpectomy      left  . Ablation      of SVT  . Cardiac catheterization  2009    normal coronary arteries  . Colonoscopy  09/13/2009    AOZ:[id number] internal hemorrhoids  . Esophagogastroduodenoscopy  09/13/2009    KGM:WNUUVO/  . Esophagogastroduodenoscopy N/A 06/11/2013    Procedure: ESOPHAGOGASTRODUODENOSCOPY (EGD);  Surgeon: West Bali, MD;  Location: [facility] ENDO SUITE;  Service: Endoscopy;  Laterality: N/A;  2:15-moved to 1045 [name] notified pt  . Portacath placement Left 07/06/2013    Procedure: INSERTION PORT-A-CATH;  Surgeon: Almond Lint, MD;   Location: [facility] ORS;  Service: General;  Laterality: Left;  . Laparotomy N/A 07/07/2013    Procedure: EXPLORATORY LAPAROTOMY;  Surgeon: Dalia Heading, MD;  Location: [facility] ORS;  Service: General;  Laterality: N/A;  . Gastrorrhaphy N/A 07/07/2013    Procedure: Ferrel Logan;  Surgeon: Dalia Heading, MD;  Location: [facility] ORS;  Service: General;  Laterality: N/A;    Family History:  Family History  Problem Relation Age of Onset  . Colon cancer Neg Hx   . Cancer Mother     leukemia  . Cancer Sister     breast  . Heart disease Brother   . Heart disease Sister     Social History:  reports that she has quit smoking. She has never used smokeless tobacco. She reports that she does not drink alcohol or use illicit drugs.  Allergies: No Known Allergies  Medications:   Medication List    Notice   This visit is during an admission. Changes to the med list made in this visit will be reflected in the After Visit Summary of the admission.     Please HPI for pertinent positives, otherwise complete 10 system ROS negative.  Physical Exam: There were no vitals taken for this visit. There is no weight on file to calculate BMI.  General Appearance:  Alert, cooperative, no distress  Head:  Normocephalic, without  obvious abnormality, atraumatic  Neck: Supple, symmetrical, trachea midline  Lungs:   Clear to auscultation bilaterally, no w/r/r, respirations unlabored without use of accessory muscles.  Chest Wall:  No tenderness or deformity  Heart:  Regular rate and rhythm, S1, S2 normal, no murmur, rub or gallop.  Abdomen:   Soft, non-tender, non distended, (+) BS  Extremities: Extremities normal, atraumatic, no cyanosis or edema  Pulses: 1+ and symmetric  Neurologic: Normal affect, no gross deficits.   Results for orders placed during the hospital encounter of 08/18/13 (from the past 48 hour(s))  HEPARIN LEVEL (UNFRACTIONATED)     Status: Abnormal   Collection Time    08/19/13 10:43 AM      Result  Value Range   Heparin Unfractionated 1.35 (*) 0.30 - 0.70 IU/mL   Comment:            IF HEPARIN RESULTS ARE BELOW     EXPECTED VALUES, AND PATIENT     DOSAGE HAS BEEN CONFIRMED,     SUGGEST FOLLOW UP TESTING     OF ANTITHROMBIN III LEVELS.  CBC     Status: Abnormal   Collection Time    08/20/13  4:41 AM      Result Value Range   WBC 4.0  4.0 - 10.5 K/uL   RBC 3.59 (*) 3.87 - 5.11 MIL/uL   Hemoglobin 9.3 (*) 12.0 - 15.0 g/dL   HCT 16.1 (*) 09.6 - 04.5 %   MCV 78.6  78.0 - 100.0 fL   MCH 25.9 (*) 26.0 - 34.0 pg   MCHC 33.0  30.0 - 36.0 g/dL   RDW 40.9 (*) 81.1 - 91.4 %   Platelets 287  150 - 400 K/uL   US Venous Img Lower Bilateral  08/19/2013   CLINICAL DATA:  Leg pain.  EXAM: VENOUS DOPPLER ULTRASOUND OF BILATERAL LOWER EXTREMITIES  TECHNIQUE: Gray-scale sonography with graded compression, as well as color Doppler and duplex ultrasound, were performed to evaluate the deep venous system from the level of the common femoral vein through the popliteal and proximal calf veins. Spectral Doppler was utilized to evaluate flow at rest and with distal augmentation maneuvers.  COMPARISON:  None.  FINDINGS: Thrombus within deep veins: There is nonocclusive deep venous thrombosis of the distal superficial femoral vein and popliteal vein of the left leg. No deep venous thrombosis of the right leg.  Compressibility of deep veins: The area of DVT of the left leg is not compressible. The deep veins of the right leg are compressible.  Duplex waveform respiratory phasicity: Normal on the right. Abnormal at the site of DVT on the left.  Duplex waveform response to augmentation:  Normal on the right.  Venous reflux:  None visualized.  Other findings:  Subcutaneous edema noted in the left leg.  IMPRESSION: Nonocclusive deep venous thrombosis of the distal left superficial femoral vein and left popliteal vein. Normal deep veins of the right leg.   Electronically Signed   By: Geanie Cooley M.D.   On: 08/19/2013  18:27    Assessment/Plan Acute bilateral pulmonary embolus LLE DVT Recent GI bleed with history of gastric cancer. Images and labs reviewed, patient has been NPO and sq heparin has been held. Risks and Benefits discussed with the patient. All of the patient's questions were answered, patient is agreeable to proceed. Consent signed and in chart.   Pattricia Boss D PA-C 08/21/2013, 9:04 AM

## 2013-08-22 MED ORDER — ENOXAPARIN SODIUM 120 MG/0.8ML ~~LOC~~ SOLN
120.0000 mg | SUBCUTANEOUS | Status: DC
  Administered 2013-08-22: 120 mg via SUBCUTANEOUS
  Filled 2013-08-22 (×3): qty 0.8

## 2013-08-22 NOTE — Progress Notes (Signed)
[facility] PROGRESS NOTE  [name]:[id number] DOB: 1940/12/01 DOA: 08/18/2013 PCP: Evlyn Courier, MD  Assessment/Plan: 1. Acute multiple bilateral PE: Remains hemodynamically stable. No hypoxia. Continue Lovenox per oncology. Monitor for bleeding. 2. Acute left lower extremity DVT: Continue Lovenox, change to daily. Now status post IVC filter placement. 3. Recent GI bleed with acute blood loss anemia: Hemoglobin stable. No evidence of recurrent bleeding. 4. Stage IV (T4b, Nx, M1) adenocarcinoma of stomach: presented to [facility] on 07/07/2013 with gastric perforation; operated on and patched by Dr. Lovell Sheehan the same day on 07/07/2013. During surgery, Dr. Lovell Sheehan noted malignant appearing peritoneal implants that were biopsied and positive for metastatic disease. Chemotherapy currently on hold. Has appointment at [facility] next week for second opinion.    Monitor overnight. Check CBC in AM, if stable, discharge home.  Change Lovenox to q24 and instruct family in administration.   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 Chest CT revealed multiple bilateral PE.  Consultants:  Oncology  Procedures:    HPI/Subjective: Continues to feel better, eating better, breathing better. No chest pain or SOB.  Objective: Filed Vitals:   08/21/13 0426 08/21/13 1616 08/21/13 2127 08/22/13 0435  BP: 134/75 114/73 122/76 102/68  Pulse: 59 48 49 53  Temp: 98 F (36.7  C) 98 F (36.7 C) 97.6 F (36.4 C) 98 F (36.7 C)  TempSrc: Oral Oral Oral Oral  Resp: 20 16 15 14   Height:      Weight:      SpO2: 99% 97% 98% 98%    Intake/Output Summary (Last 24 hours) at 08/22/13 1217 Last data filed at 08/21/13 1730  Gross per 24 hour  Intake    480 ml  Output      0 ml  Net    480 ml     Filed Weights   08/18/13 2049  Weight: 86.274 kg (190 lb 3.2 oz)    Exam:   Afebrile, vital signs stable. No hypoxia.  General: Appears calm and comfortable.  Respiratory: Clear to auscultation bilaterally. No wheezes, rales or rhonchi. Normal respiratory effort.  Data Reviewed:  No new data  Scheduled Meds: . dicyclomine  20 mg Oral BID AC & HS  . diltiazem (CARDIZEM CD) 24 hr capsule 360 mg  360 mg Oral Q breakfast  . enoxaparin (LOVENOX) injection  1 mg/kg Subcutaneous Q12H  . escitalopram  10 mg Oral Daily  . fluconazole  100 mg Oral Daily  . Linaclotide  145 mcg Oral Daily  . metoCLOPramide  10 mg Oral TID AC & HS  . metoprolol succinate  75 mg Oral Daily  . pantoprazole  40 mg Oral BID  . sodium chloride  3 mL Intravenous Q12H   Continuous Infusions:    Principal Problem:   Bilateral pulmonary embolism Active Problems:   HYPERTENSION   GASTRIC ULCER, HX OF   Adenocarcinoma of stomach   SOB (shortness of breath)   DVT, lower extremity   Time spent 15 minutes

## 2013-08-23 ENCOUNTER — Inpatient Hospital Stay (HOSPITAL_COMMUNITY): Payer: Medicare Other

## 2013-08-23 ENCOUNTER — Ambulatory Visit (HOSPITAL_COMMUNITY): Payer: Medicare Other

## 2013-08-23 LAB — CBC
HCT: 29.8 % — ABNORMAL LOW (ref 36.0–46.0)
Hemoglobin: 9.7 g/dL — ABNORMAL LOW (ref 12.0–15.0)
MCH: 26.3 pg (ref 26.0–34.0)
MCHC: 32.6 g/dL (ref 30.0–36.0)
MCV: 80.8 fL (ref 78.0–100.0)
RBC: 3.69 MIL/uL — ABNORMAL LOW (ref 3.87–5.11)

## 2013-08-23 MED ORDER — HEPARIN SOD (PORK) LOCK FLUSH 100 UNIT/ML IV SOLN
500.0000 [IU] | INTRAVENOUS | Status: AC | PRN
  Administered 2013-08-23: 500 [IU]
  Filled 2013-08-23: qty 5

## 2013-08-23 MED ORDER — ENOXAPARIN SODIUM 120 MG/0.8ML ~~LOC~~ SOLN: 120.0000 mg | SUBCUTANEOUS | Status: DC

## 2013-08-23 MED ORDER — ENOXAPARIN SODIUM 120 MG/0.8ML ~~LOC~~ SOLN: 120.0000 mg | SUBCUTANEOUS | Status: AC

## 2013-08-23 NOTE — Progress Notes (Signed)
Patient states understanding of discharge instructions, prescription given. 

## 2013-08-23 NOTE — Progress Notes (Signed)
[facility] PROGRESS NOTE  [name] ZOX:[id number] DOB: [birth year] DOA: 08/18/2013 PCP: Evlyn Courier, MD  Assessment/Plan: 1. Acute multiple bilateral PE: Remains hemodynamically stable. No hypoxia. Continue Lovenox per oncology.  2. Acute left lower extremity DVT: Continue Lovenox, daily. Status post IVC filter placement. 3. Recent GI bleed with acute blood loss anemia: Hemoglobin stable. No evidence of recurrent bleeding. Hemoglobin remained stable. 4. Stage IV (T4b, Nx, M1) adenocarcinoma of stomach: presented to [facility] on 07/07/2013 with gastric perforation; operated on and patched by Dr. Lovell Sheehan the same day on 07/07/2013. During surgery, Dr. Lovell Sheehan noted malignant appearing peritoneal implants that were biopsied and positive for metastatic disease. Chemotherapy currently on hold. Has appointment at [facility] next week for second opinion.    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 Chest CT revealed multiple bilateral PE.  Consultants:  Oncology  Procedures:    HPI/Subjective: No issues overnight. No chest pain shortness of breath. No bleeding.  Objective: Filed Vitals:   08/22/13 0435 08/22/13 1457 08/22/13 2232 08/23/13 0532  BP: 102/68 120/72 114/72 131/73  Pulse: 53 49 51 54  Temp: 98 F (36.7 C) 97.4 F (36.3 C) 97.8 F (36.6 C) 98.2 F (36.8 C)  TempSrc: Oral Oral Oral Oral   Resp: 14 16 15 16   Height:      Weight:      SpO2: 98% 97% 95% 95%    Intake/Output Summary (Last 24 hours) at 08/23/13 1029 Last data filed at 08/23/13 0936  Gross per 24 hour  Intake    390 ml  Output      0 ml  Net    390 ml     Filed Weights   08/18/13 2049  Weight: 86.274 kg (190 lb 3.2 oz)    Exam:   Afebrile, vital signs stable. No hypoxia.  General: Appears calm and comfortable.  Cardiovascular: Regular rate and rhythm. No murmur, rub or gallop.  Respiratory: Clear to auscultation bilaterally. No wheezes, rales or rhonchi. Normal respiratory effort.  Data Reviewed:  Hemoglobin 9.7  Scheduled Meds: . dicyclomine  20 mg Oral BID AC & HS  . diltiazem (CARDIZEM CD) 24 hr capsule 360 mg  360 mg Oral Q breakfast  . enoxaparin (LOVENOX) injection  120 mg Subcutaneous Q24H  . escitalopram  10 mg Oral Daily  . fluconazole  100 mg Oral Daily  . Linaclotide  145 mcg Oral Daily  . metoCLOPramide  10 mg Oral TID AC & HS  . metoprolol succinate  75 mg Oral Daily  . pantoprazole  40 mg Oral BID  . sodium chloride  3 mL Intravenous Q12H   Continuous Infusions:    Principal Problem:   Bilateral pulmonary embolism Active Problems:   HYPERTENSION   GASTRIC ULCER, HX OF   Adenocarcinoma of stomach   SOB (shortness of breath)   DVT, lower extremity

## 2013-08-23 NOTE — Discharge Summary (Signed)
Physician Discharge Summary  [name]:[id number] DOB: [birth year] DOA: 08/18/2013  PCP: Evlyn Courier, MD  Admit date: 08/18/2013 Discharge date: 08/23/2013  Recommendations for Outpatient Follow-up:  1. Followup with you multiple bilateral PE and acute left lower extremity DVT 2. Acute blood loss anemia secondary to recent GI bleed, consider CBC periodically 3. Second opinion at [facility]  Follow-up Information   Follow up with [facility].   Contact information:   [address] [place] [address] [phone]      Follow up with [facility], MD In 1 week.   Specialty:  Family Medicine   Contact information:   [address] [place] [address] [phone]       Follow up with KEFALAS,THOMAS, PA-C. (contact office for follow-up after second opinion at [facility])    Specialty:  Physician Assistant   Contact information:   [address] [place] [address] [phone]      Discharge Diagnoses:  1. Acute multiple bilateral PE 2. Acute left lower extremity DVT 3. Recent GI bleed with acute blood loss anemia prior to admission, no recurrence this admission 4. 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 Chest CT revealed multiple bilateral PE.  Hospital Course:  Ms. [name] was admitted for further evaluation of PE. She was seen in consultation with oncology and GI to formulate her treatment plan given her history of gastric cancer with recent bleed. In addition to pulmonary embolism, she was also  diagnosed with lower extremity DVT. After multidisciplinary discussion, the patient elected anticoagulation, fully knowing the risks of catastrophic bleed. Because of the risk of bleeding, oncology recommended insertion of IVC filter which was accomplished during this admission. She has remained hemodynamically stable without signs or symptoms of pulmonary compromise and is had no bleeding. Her hemoglobin remained stable. She has a second opinion scheduled at [facility].  1. Acute multiple bilateral PE: Remains hemodynamically stable. No hypoxia. Continue Lovenox per oncology.  2. Acute left lower extremity DVT: Continue Lovenox, daily. Status post IVC filter placement. 3. Recent GI bleed with acute blood loss anemia: Hemoglobin stable. No evidence of recurrent bleeding. Hemoglobin remained stable. 4. Stage IV (T4b, Nx, M1) adenocarcinoma of stomach: presented to [facility] on 07/07/2013 with gastric perforation; operated on and patched by Dr. Lovell Sheehan the same day on 07/07/2013. During surgery, Dr. Lovell Sheehan noted malignant appearing peritoneal implants that were biopsied and positive for metastatic disease. Chemotherapy currently on hold. Has appointment at [facility] next week for second opinion.  Consultants:  Oncology Procedures: IVC filter placement  Discharge Instructions  Discharge Orders   Future Appointments Provider Department Dept Phone   08/30/2013 8:30 AM [facility] Covering Provider [facility] [phone]   08/30/2013 8:45 AM [facility] Team B [facility] [phone]   09/06/2013 2:45 PM [facility] Team A [place] [facility] [phone]   09/13/2013 2:45 PM [facility] Team A  CANCER CENTER [phone]   Future Orders Complete By Expires   Diet general  As directed    Discharge instructions  As  directed    Comments:     You have been started on blood thinner for blood clots in your lung and leg. Call your physician or seek immediate  medical attention for bleeding, shortness of breath, chest pain or worsening of condition. And   Increase activity slowly  As directed        Medication List    STOP taking these medications       dexamethasone 4 MG tablet  Commonly known as:  DECADRON      TAKE these medications       dicyclomine 20 MG tablet  Commonly known as:  BENTYL  Take 20 mg by mouth 2 (two) times daily at 8 am and 10 pm.     diltiazem 360 MG 24 hr capsule  Commonly known as:  TIAZAC  Take 360 mg by mouth every morning.     enoxaparin 120 MG/0.8ML injection  Commonly known as:  LOVENOX  Inject 0.8 mLs (120 mg total) into the skin daily.     fish oil-omega-3 fatty acids 1000 MG capsule  Take 1 g by mouth daily.     fluconazole 100 MG tablet  Commonly known as:  DIFLUCAN  Take 1 tablet (100 mg total) by mouth daily.     lidocaine-prilocaine cream  Commonly known as:  EMLA  Apply 1 application topically as needed (for port access).     LINZESS 145 MCG Caps capsule  Generic drug:  Linaclotide  Take 145 mcg by mouth daily.     LORazepam 0.5 MG tablet  Commonly known as:  ATIVAN  Take 1 tablet (0.5 mg total) by mouth every 6 (six) hours as needed (nausea/vomiting).     lovastatin 40 MG tablet  Commonly known as:  MEVACOR  Take 2 tablets (80 mg total) by mouth at bedtime.     magnesium hydroxide 400 MG/5ML suspension  Commonly known as:  MILK OF MAGNESIA  Take 30 mLs by mouth daily as needed for constipation.     metoCLOPramide 10 MG tablet  Commonly known as:  REGLAN  Take 1 tablet (10 mg total) by mouth 4 (four) times daily.     metoprolol succinate 25 MG 24 hr tablet  Commonly known as:  TOPROL-XL  Take 3 tablets (75 mg total) by mouth daily. Take with or immediately following a meal.     ondansetron 8 MG tablet  Commonly known as:  ZOFRAN  Take 1 tablet (8 mg total) by mouth every 8 (eight) hours as needed for nausea or vomiting.     oxyCODONE 5 MG immediate release tablet   Commonly known as:  Oxy IR/ROXICODONE  Take 1 tablet (5 mg total) by mouth every 4 (four) hours as needed for pain.     pantoprazole 40 MG tablet  Commonly known as:  PROTONIX  Take 1 tablet (40 mg total) by mouth 2 (two) times daily.     PROBIOTIC FORMULA PO  Take 1 tablet by mouth every morning.     prochlorperazine 10 MG tablet  Commonly known as:  COMPAZINE  Take 1 tablet (10 mg total) by mouth every 6 (six) hours.     STOOL SOFTENER PO  Take 1 capsule by mouth 2 (two) times daily.     temazepam 7.5 MG capsule  Commonly known as:  RESTORIL  Take 7.5 mg by mouth at bedtime as needed for sleep.       No Known Allergies  The results of significant diagnostics from this  hospitalization (including imaging, microbiology, ancillary and laboratory) are listed below for reference.    Significant Diagnostic Studies: Dg Chest 2 View  08/18/2013   CLINICAL DATA:  Shortness of breath. History of gastric cancer. Ex-smoker.  EXAM: CHEST  2 VIEW  COMPARISON:  08/06/2013.  FINDINGS: The cardiac silhouette remains near the upper limit of normal in size. Clear lungs. Stable left subclavian porta catheter. Thoracic spine degenerative changes. Mild bilateral shoulder degenerative changes.  IMPRESSION: No acute abnormality.   Electronically Signed   By: Gordan Payment M.D.   On: 08/18/2013 22:08   Ct Angio Chest Pe W/cm &/or Wo Cm  08/19/2013   CLINICAL DATA:  Shortness of breath. Undergoing chemotherapy for gastric carcinoma.  EXAM: CT ANGIOGRAPHY CHEST WITH CONTRAST  TECHNIQUE: Multidetector CT imaging of the chest was performed using the standard protocol during bolus administration of intravenous contrast. Multiplanar CT image reconstructions including MIPs were obtained to evaluate the vascular anatomy.  CONTRAST:  OMNIPAQUE IOHEXOL 350 MG/ML SOLN  COMPARISON:  Chest radiographs obtained earlier today. PET-CT dated 06/24/2013. Abdomen and pelvis CT dated 07/07/2013.  FINDINGS: Multiple  bilateral pulmonary arterial filling defects. These include the upper and lower lobes bilaterally and the right middle lobe. No large central pulmonary arterial filling defects are seen. No lung nodules or enlarged lymph nodes. Thoracic spine degenerative changes. Left adrenal mass measuring 2.5 x 2.0 cm on image number 80 and partially included smaller right adrenal mass measuring 1.4 x 0.8 cm on image number 85. These have not changed significantly since 07/07/2013. Diffuse gastric wall thickening is again noted.  Review of the MIP images confirms the above findings.  IMPRESSION: 1. Multiple small to moderate-sized bilateral pulmonary emboli. 2. Stable bilateral adrenal adenomas. 3. Persistent gastric wall thickening compatible with the patient's known gastric carcinoma. Critical Value/emergent results were called by telephone at the time of interpretation on 08/19/2013 at 12:10 AM to Dr.STEPHEN RANCOUR , who verbally acknowledged these results.   Electronically Signed   By: Gordan Payment M.D.   On: 08/19/2013 00:12   Ir Ivc Filter Plmt / S&i /img Guid/mod Sed  08/21/2013   CLINICAL DATA:  DVT, PE, GI bleed, patient cannot be adequately anticoagulated  EXAM: ULTRASOUND GUIDANCE FOR VASCULAR ACCESS  IVC CATHETERIZATION AND VENOGRAM  IVC FILTER INSERTION  Date:  11/15/201411/15/2014 9:42 AM  Radiologist:  Judie Petit. Ruel Favors, MD  Guidance:  Ultrasound and fluoroscopic  MEDICATIONS AND MEDICAL HISTORY: 2 mg Versed, 50 mcg fentanyl  ANESTHESIA/SEDATION: 10 min  CONTRAST:  45mL OMNIPAQUE IOHEXOL 300 MG/ML  SOLN  FLUOROSCOPY TIME:  1 min  PROCEDURE: Informed consent was obtained from the patient following explanation of the procedure, risks, benefits and alternatives. The patient understands, agrees and consents for the procedure. All questions were addressed. A time out was performed.  Maximal barrier sterile technique utilized including caps, mask, sterile gowns, sterile gloves, large sterile drape, hand hygiene, and  betadine prep.  Under sterile condition and local anesthesia, right internal jugular venous access was performed with ultrasound. Over a guide wire, the IVC filter delivery sheath and inner dilator were advanced into the IVC just above the IVC bifurcation. Contrast injection was performed for an IVC venogram.  IVC VENOGRAM: The IVC is patent. No evidence of thrombus, stenosis, or occlusion. No variant venous anatomy. The renal veins are identified at L1-2.  IVC FILTER INSERTION: Through the delivery sheath, the Bard Denali IVC filter was deployed in the infrarenal IVC at the L2-3 level just below the  renal veins and above the IVC bifurcation. Contrast injection confirmed position. There is good apposition of the filter against the IVC.  The delivery sheath was removed and hemostasis was obtained with compression for 5 minutes. The patient tolerated the procedure well. No immediate complications.  COMPLICATIONS: No immediate  IMPRESSION: Ultrasound and fluoroscopically guided infrarenal IVC filter insertion.   Electronically Signed   By: Ruel Favors M.D.   On: 08/21/2013 09:55   US Venous Img Lower Bilateral  08/19/2013   CLINICAL DATA:  Leg pain.  EXAM: VENOUS DOPPLER ULTRASOUND OF BILATERAL LOWER EXTREMITIES  TECHNIQUE: Gray-scale sonography with graded compression, as well as color Doppler and duplex ultrasound, were performed to evaluate the deep venous system from the level of the common femoral vein through the popliteal and proximal calf veins. Spectral Doppler was utilized to evaluate flow at rest and with distal augmentation maneuvers.  COMPARISON:  None.  FINDINGS: Thrombus within deep veins: There is nonocclusive deep venous thrombosis of the distal superficial femoral vein and popliteal vein of the left leg. No deep venous thrombosis of the right leg.  Compressibility of deep veins: The area of DVT of the left leg is not compressible. The deep veins of the right leg are compressible.  Duplex  waveform respiratory phasicity: Normal on the right. Abnormal at the site of DVT on the left.  Duplex waveform response to augmentation:  Normal on the right.  Venous reflux:  None visualized.  Other findings:  Subcutaneous edema noted in the left leg.  IMPRESSION: Nonocclusive deep venous thrombosis of the distal left superficial femoral vein and left popliteal vein. Normal deep veins of the right leg.   Electronically Signed   By: Geanie Cooley M.D.   On: 08/19/2013 18:27    Microbiology: Recent Results (from the past 240 hour(s))  MRSA PCR SCREENING     Status: None   Collection Time    08/19/13  1:45 AM      Result Value Range Status   MRSA by PCR NEGATIVE  NEGATIVE Final   Comment:            The GeneXpert MRSA Assay (FDA     approved for NASAL specimens     only), is one component of a     comprehensive MRSA colonization     surveillance program. It is not     intended to diagnose MRSA     infection nor to guide or     monitor treatment for     MRSA infections.     Labs: Basic Metabolic Panel:  Recent Labs Lab 08/18/13 2129 08/19/13 0308  NA 134* 134*  K 4.0 4.1  CL 96 95*  CO2 26 28  GLUCOSE 172* 182*  BUN 19 18  CREATININE 0.80 0.76  CALCIUM 9.7 9.6   Liver Function Tests:  Recent Labs Lab 08/18/13 2129  AST 69*  ALT 255*  ALKPHOS 78  BILITOT 0.3  PROT 6.9  ALBUMIN 3.1*   CBC:  Recent Labs Lab 08/16/13 1258 08/18/13 2129 08/19/13 0308 08/20/13 0441 08/23/13 0452  WBC 2.9* 4.5  --  4.0 4.8  NEUTROABS 2.1 3.0  --   --   --   HGB 9.0* 10.0* 9.7* 9.3* 9.7*  HCT 27.7* 30.6* 29.6* 28.2* 29.8*  MCV 77.8* 77.9*  --  78.6 80.8  PLT 300 275  --  287 298   Cardiac Enzymes:  Recent Labs Lab 08/18/13 2129  TROPONINI <0.30    Recent Labs  08/18/13 2129  PROBNP 164.2*    Principal Problem:   Bilateral pulmonary embolism Active Problems:   HYPERTENSION   GASTRIC ULCER, HX OF   Adenocarcinoma of stomach   SOB (shortness of breath)   DVT,  lower extremity   Time coordinating discharge: 35 minutes  Signed:  [name], MD [facility] 08/23/2013, 10:52 AM

## 2013-08-25 ENCOUNTER — Telehealth: Payer: Self-pay | Admitting: Gastroenterology

## 2013-08-25 NOTE — Telephone Encounter (Signed)
The Oncologist Byrd Hesselbach) from [facility] called to give [facility] a follow up call regarding this patient. Nothing urgent. [phone] is her direct number

## 2013-08-25 NOTE — Telephone Encounter (Signed)
SPOKE WITH [facility]. FAXED PATH AND OP NOTE. SHE WILL CALL DR. FORMANEK TO DISCUSS POSSIBLE CHANGE IN HER REGIMEN.

## 2013-08-30 ENCOUNTER — Encounter (HOSPITAL_COMMUNITY): Payer: Self-pay | Admitting: Emergency Medicine

## 2013-08-30 ENCOUNTER — Encounter (HOSPITAL_COMMUNITY): Payer: Self-pay

## 2013-08-30 ENCOUNTER — Other Ambulatory Visit (HOSPITAL_COMMUNITY): Payer: Self-pay | Admitting: Oncology

## 2013-08-30 ENCOUNTER — Emergency Department (HOSPITAL_COMMUNITY)
Admission: EM | Admit: 2013-08-30 | Discharge: 2013-08-30 | Disposition: A | Discharge status: Home / Self Care | Payer: Medicare Other | Attending: Emergency Medicine | Admitting: Emergency Medicine

## 2013-08-30 ENCOUNTER — Encounter (HOSPITAL_BASED_OUTPATIENT_CLINIC_OR_DEPARTMENT_OTHER): Payer: Medicare Other

## 2013-08-30 ENCOUNTER — Emergency Department (HOSPITAL_COMMUNITY): Payer: Medicare Other

## 2013-08-30 ENCOUNTER — Encounter (HOSPITAL_COMMUNITY): Payer: Medicare Other

## 2013-08-30 ENCOUNTER — Inpatient Hospital Stay (HOSPITAL_COMMUNITY): Payer: Medicare Other

## 2013-08-30 VITALS — BP 160/88 | HR 67 | Temp 98.3°F | Resp 18 | Wt 178.8 lb

## 2013-08-30 DIAGNOSIS — I10 Essential (primary) hypertension: Secondary | ICD-10-CM | POA: Insufficient documentation

## 2013-08-30 DIAGNOSIS — R0600 Dyspnea, unspecified: Secondary | ICD-10-CM

## 2013-08-30 DIAGNOSIS — K219 Gastro-esophageal reflux disease without esophagitis: Secondary | ICD-10-CM | POA: Insufficient documentation

## 2013-08-30 DIAGNOSIS — M129 Arthropathy, unspecified: Secondary | ICD-10-CM | POA: Insufficient documentation

## 2013-08-30 DIAGNOSIS — E7800 Pure hypercholesterolemia, unspecified: Secondary | ICD-10-CM | POA: Insufficient documentation

## 2013-08-30 DIAGNOSIS — C169 Malignant neoplasm of stomach, unspecified: Secondary | ICD-10-CM

## 2013-08-30 DIAGNOSIS — R0989 Other specified symptoms and signs involving the circulatory and respiratory systems: Secondary | ICD-10-CM | POA: Insufficient documentation

## 2013-08-30 DIAGNOSIS — Z85858 Personal history of malignant neoplasm of other endocrine glands: Secondary | ICD-10-CM | POA: Insufficient documentation

## 2013-08-30 DIAGNOSIS — Z86718 Personal history of other venous thrombosis and embolism: Secondary | ICD-10-CM | POA: Insufficient documentation

## 2013-08-30 DIAGNOSIS — Z95818 Presence of other cardiac implants and grafts: Secondary | ICD-10-CM | POA: Insufficient documentation

## 2013-08-30 DIAGNOSIS — J441 Chronic obstructive pulmonary disease with (acute) exacerbation: Secondary | ICD-10-CM | POA: Insufficient documentation

## 2013-08-30 DIAGNOSIS — R0609 Other forms of dyspnea: Secondary | ICD-10-CM | POA: Insufficient documentation

## 2013-08-30 DIAGNOSIS — R300 Dysuria: Secondary | ICD-10-CM

## 2013-08-30 DIAGNOSIS — E663 Overweight: Secondary | ICD-10-CM

## 2013-08-30 DIAGNOSIS — F329 Major depressive disorder, single episode, unspecified: Secondary | ICD-10-CM

## 2013-08-30 DIAGNOSIS — D500 Iron deficiency anemia secondary to blood loss (chronic): Secondary | ICD-10-CM

## 2013-08-30 DIAGNOSIS — F411 Generalized anxiety disorder: Secondary | ICD-10-CM | POA: Insufficient documentation

## 2013-08-30 DIAGNOSIS — D62 Acute posthemorrhagic anemia: Secondary | ICD-10-CM

## 2013-08-30 DIAGNOSIS — Z85028 Personal history of other malignant neoplasm of stomach: Secondary | ICD-10-CM

## 2013-08-30 DIAGNOSIS — Z7901 Long term (current) use of anticoagulants: Secondary | ICD-10-CM | POA: Insufficient documentation

## 2013-08-30 DIAGNOSIS — Z8711 Personal history of peptic ulcer disease: Secondary | ICD-10-CM | POA: Insufficient documentation

## 2013-08-30 DIAGNOSIS — G629 Polyneuropathy, unspecified: Secondary | ICD-10-CM

## 2013-08-30 DIAGNOSIS — Z87891 Personal history of nicotine dependence: Secondary | ICD-10-CM | POA: Insufficient documentation

## 2013-08-30 DIAGNOSIS — Z8619 Personal history of other infectious and parasitic diseases: Secondary | ICD-10-CM | POA: Insufficient documentation

## 2013-08-30 DIAGNOSIS — C7889 Secondary malignant neoplasm of other digestive organs: Secondary | ICD-10-CM | POA: Insufficient documentation

## 2013-08-30 DIAGNOSIS — Z8679 Personal history of other diseases of the circulatory system: Secondary | ICD-10-CM | POA: Insufficient documentation

## 2013-08-30 DIAGNOSIS — Z79899 Other long term (current) drug therapy: Secondary | ICD-10-CM | POA: Insufficient documentation

## 2013-08-30 LAB — CBC WITH DIFFERENTIAL/PLATELET
Basophils Absolute: 0 10*3/uL (ref 0.0–0.1)
Basophils Relative: 0 % (ref 0–1)
HCT: 37 % (ref 36.0–46.0)
Hemoglobin: 11.9 g/dL — ABNORMAL LOW (ref 12.0–15.0)
Lymphocytes Relative: 9 % — ABNORMAL LOW (ref 12–46)
MCHC: 32.2 g/dL (ref 30.0–36.0)
Monocytes Absolute: 0.8 10*3/uL (ref 0.1–1.0)
Monocytes Relative: 7 % (ref 3–12)
Neutro Abs: 9.8 10*3/uL — ABNORMAL HIGH (ref 1.7–7.7)
Platelets: 376 10*3/uL (ref 150–400)
RDW: 20.5 % — ABNORMAL HIGH (ref 11.5–15.5)
WBC: 11.6 10*3/uL — ABNORMAL HIGH (ref 4.0–10.5)

## 2013-08-30 LAB — IRON AND TIBC
Saturation Ratios: 13 % — ABNORMAL LOW (ref 20–55)
TIBC: 405 ug/dL (ref 250–470)
UIBC: 351 ug/dL (ref 125–400)

## 2013-08-30 LAB — COMPREHENSIVE METABOLIC PANEL
ALT: 383 U/L — ABNORMAL HIGH (ref 0–35)
AST: 89 U/L — ABNORMAL HIGH (ref 0–37)
Albumin: 3.4 g/dL — ABNORMAL LOW (ref 3.5–5.2)
Calcium: 9.7 mg/dL (ref 8.4–10.5)
Chloride: 98 mEq/L (ref 96–112)
Creatinine, Ser: 0.7 mg/dL (ref 0.50–1.10)
Potassium: 4.5 mEq/L (ref 3.5–5.1)
Sodium: 137 mEq/L (ref 135–145)

## 2013-08-30 LAB — HEMOGLOBIN A1C
Hgb A1c MFr Bld: 6.3 % — ABNORMAL HIGH (ref <5.7)
Mean Plasma Glucose: 134 mg/dL — ABNORMAL HIGH (ref <117)

## 2013-08-30 LAB — MAGNESIUM: Magnesium: 2.3 mg/dL (ref 1.5–2.5)

## 2013-08-30 MED ORDER — ALBUTEROL SULFATE (5 MG/ML) 0.5% IN NEBU
5.0000 mg | INHALATION_SOLUTION | Freq: Once | RESPIRATORY_TRACT | Status: AC
  Administered 2013-08-30: 5 mg via RESPIRATORY_TRACT
  Filled 2013-08-30: qty 1

## 2013-08-30 MED ORDER — IPRATROPIUM BROMIDE 0.02 % IN SOLN
0.5000 mg | Freq: Once | RESPIRATORY_TRACT | Status: AC
  Administered 2013-08-30: 0.5 mg via RESPIRATORY_TRACT
  Filled 2013-08-30: qty 2.5

## 2013-08-30 MED ORDER — HEPARIN SOD (PORK) LOCK FLUSH 100 UNIT/ML IV SOLN
INTRAVENOUS | Status: AC
  Filled 2013-08-30: qty 5

## 2013-08-30 MED ORDER — ESCITALOPRAM OXALATE 10 MG PO TABS: 10.0000 mg | ORAL_TABLET | Freq: Every day | ORAL | Status: AC

## 2013-08-30 NOTE — ED Notes (Signed)
Feels weak, seen at cancer center today, called later and told her calcium level was low.  And was to come to Cancer center in AM.  Felt too weak and came to ER.

## 2013-08-30 NOTE — Progress Notes (Signed)
Seen by MD today.  Course of chemotherapy to be changed. To report back to clinic on Monday for cycle one.  Home accompanied by friend.  Port accessed with #20 Demetrios Isaacs needle.  Good blood return.  Specimen sent to lab for tests.  Access d/c.

## 2013-08-30 NOTE — ED Notes (Signed)
Gave patient socks and blanket as requested.

## 2013-08-30 NOTE — Progress Notes (Signed)
[facility]  OFFICE PROGRESS NOTE  Evlyn Courier, MD [address] [place] [address] [place] [address]  DIAGNOSIS: Adenocarcinoma of stomach  Depression  Chief Complaint  Patient presents with  . Gastric Cancer    CURRENT THERAPY: ECF with treatment discontinued on 08/18/2013 because of hospital admission diagnosing pulmonary emboli bilaterally.  INTERVAL HISTORY: [name] 72 y.o. female returns for followup of stage IV gastric cancer having received one cycle of ECF with initial therapy started on 08/02/2013 with 5-FU infusion discontinued on 08/18/2013 because of hospitalization and diagnosis of bilateral pulmonary emboli. Patient has been told by certain prior professionals about the good prognosis of her disease. As a consequence she is depressed. She denies any abdominal pain, vomiting, but does have occasional nausea for which he takes oral antinauseants. She denies any melena, hematochezia, hematuria, shortness of breath that is worsening, abdominal distention, back pain, epistaxis, hematuria, or vaginal bleeding. She continues on enoxaparin subcutaneously daily. She was seen in second opinion consultation at [facility] by Dr.Matsangou recommended.Kennon Rounds plan regimen without bolus 5-FU. I have no objection to this which is in she did develop mucositis toxicity with ECF cycle 1.  MEDICAL HISTORY: Past Medical History  Diagnosis Date  . Hypertension   . Paroxysmal SVT (supraventricular tachycardia)   . Hypercholesteremia   . GERD (gastroesophageal reflux disease)   . PUD (peptic ulcer disease)   . DVT (deep venous thrombosis) 2007  . Adrenal adenoma     left  . Thyroid nodule     left  . H. pylori infection   . Diverticulosis   . 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 Diarrhea     recurrent  . Arthritis   . Hiatal hernia   . Cancer     adenocarcinoma    INTERIM  HISTORY: has BENIGN NEOPLASM OF ADRENAL GLAND; CYST OF THYROID; DYSLIPIDEMIA; OVERWEIGHT/OBESITY; TOBACCO USER; HYPERTENSION; PSVT; GERD; GASTRIC ULCER, HX OF; COLONIC POLYPS, HX OF; Dyspepsia; Adenocarcinoma of stomach; Chest pain; Hypertension; Acute blood loss anemia; Bilateral pulmonary embolism; SOB (shortness of breath); and DVT, lower extremity on her problem list.    ALLERGIES:  has No Known Allergies.  MEDICATIONS: has a current medication list which includes the following prescription(s): dicyclomine, diltiazem, docusate calcium, enoxaparin, fish oil-omega-3 fatty acids, linaclotide, lorazepam, lovastatin, metoclopramide, metoprolol succinate, oxycodone, pantoprazole, probiotic product, temazepam, escitalopram, fluconazole, lidocaine-prilocaine, magnesium hydroxide, ondansetron, and prochlorperazine.  SURGICAL HISTORY:  Past Surgical History  Procedure Laterality Date  . Total knee arthroplasty  bilateral knee  . Hip  replacement  left  . Appendectomy    . Cholecystectomy    . Abdominal hysterectomy    . Breast lumpectomy      left  . Ablation      of SVT  . Cardiac catheterization  2009    normal coronary arteries  . Colonoscopy  09/13/2009    UEA:[id number] internal hemorrhoids  . Esophagogastroduodenoscopy  09/13/2009    YQM:VHQION/  . Esophagogastroduodenoscopy N/A 06/11/2013    Procedure: ESOPHAGOGASTRODUODENOSCOPY (EGD);  Surgeon: West Bali, MD;  Location: [facility] ENDO SUITE;  Service: Endoscopy;  Laterality: N/A;  2:15-moved to 1045 [name] notified pt  . Portacath placement Left 07/06/2013    Procedure: INSERTION PORT-A-CATH;  Surgeon: Almond Lint, MD;  Location: [facility] ORS;  Service: General;  Laterality: Left;  . Laparotomy N/A 07/07/2013    Procedure: EXPLORATORY LAPAROTOMY;  Surgeon: Loraine Leriche  Val Riles, MD;  Location: [facility] ORS;  Service: General;  Laterality: N/A;  . Gastrorrhaphy N/A 07/07/2013    Procedure: Ferrel Logan;  Surgeon: Dalia Heading, MD;  Location: [facility]  ORS;  Service: General;  Laterality: N/A;    FAMILY HISTORY: family history includes Cancer in her mother and sister; Heart disease in her brother and sister. There is no history of Colon cancer.  SOCIAL HISTORY:  reports that she has quit smoking. She has never used smokeless tobacco. She reports that she does not drink alcohol or use illicit drugs.  REVIEW OF SYSTEMS:  Other than that discussed above is noncontributory.  PHYSICAL EXAMINATION: ECOG PERFORMANCE STATUS: 2 - Symptomatic, <50% confined to bed  Blood pressure 160/88, pulse 67, temperature 98.3 F (36.8 C), temperature source Oral, resp. rate 18, weight 178 lb 12.8 oz (81.103 kg).  GENERAL:alert, no distress and comfortable SKIN: skin color, texture, turgor are normal, no rashes or significant lesions EYES: PERLA; Conjunctiva are pink and non-injected, sclera clear OROPHARYNX:no exudate, no erythema on lips, buccal mucosa, or tongue. NECK: supple, thyroid normal size, non-tender, without nodularity. No masses CHEST: Normal AP diameter with light port in place. No breast masses. LYMPH:  no palpable lymphadenopathy in the cervical, axillary or inguinal LUNGS: clear to auscultation and percussion with normal breathing effort HEART: regular rate & rhythm and no murmurs. P2 is palpable at the apex per ABDOMEN:abdomen soft, non-tender and normal bowel sounds. Healed abdominal scar with no masses or hepatosplenomegaly. MUSCULOSKELETAL:no cyanosis of digits and no clubbing. Range of motion normal.  NEURO: alert & oriented x 3 with fluent speech, no focal motor/sensory deficits   LABORATORY DATA: Appointment on 08/30/2013  Component Date Value Range Status  . WBC 08/30/2013 11.6* 4.0 - 10.5 K/uL Final  . RBC 08/30/2013 4.61  3.87 - 5.11 MIL/uL Final  . Hemoglobin 08/30/2013 11.9* 12.0 - 15.0 g/dL Final  . HCT 14/78/2956 37.0  36.0 - 46.0 % Final  . MCV 08/30/2013 80.3  78.0 - 100.0 fL Final  . MCH 08/30/2013 25.8* 26.0 - 34.0  pg Final  . MCHC 08/30/2013 32.2  30.0 - 36.0 g/dL Final  . RDW 21/30/8657 20.5* 11.5 - 15.5 % Final  . Platelets 08/30/2013 376  150 - 400 K/uL Final  . Neutrophils Relative % 08/30/2013 84* 43 - 77 % Final  . Neutro Abs 08/30/2013 9.8* 1.7 - 7.7 K/uL Final  . Lymphocytes Relative 08/30/2013 9* 12 - 46 % Final  . Lymphs Abs 08/30/2013 1.1  0.7 - 4.0 K/uL Final  . Monocytes Relative 08/30/2013 7  3 - 12 % Final  . Monocytes Absolute 08/30/2013 0.8  0.1 - 1.0 K/uL Final  . Eosinophils Relative 08/30/2013 0  0 - 5 % Final  . Eosinophils Absolute 08/30/2013 0.0  0.0 - 0.7 K/uL Final  . Basophils Relative 08/30/2013 0  0 - 1 % Final  . Basophils Absolute 08/30/2013 0.0  0.0 - 0.1 K/uL Final  Admission on 08/18/2013, Discharged on 08/23/2013  Component Date Value Range Status  . WBC 08/18/2013 4.5  4.0 - 10.5 K/uL Final  . RBC 08/18/2013 3.93  3.87 - 5.11 MIL/uL Final  . Hemoglobin 08/18/2013 10.0* 12.0 - 15.0 g/dL Final  . HCT 84/69/6295 30.6* 36.0 - 46.0 % Final  . MCV 08/18/2013 77.9* 78.0 - 100.0 fL Final  . MCH 08/18/2013 25.4* 26.0 - 34.0 pg Final  . MCHC 08/18/2013 32.7  30.0 - 36.0 g/dL Final  . RDW 28/41/3244 18.0* 11.5 -  15.5 % Final  . Platelets 08/18/2013 275  150 - 400 K/uL Final  . Neutrophils Relative % 08/18/2013 67  43 - 77 % Final  . Lymphocytes Relative 08/18/2013 19  12 - 46 % Final  . Monocytes Relative 08/18/2013 14* 3 - 12 % Final  . Eosinophils Relative 08/18/2013 0  0 - 5 % Final  . Basophils Relative 08/18/2013 0  0 - 1 % Final  . Neutro Abs 08/18/2013 3.0  1.7 - 7.7 K/uL Final  . Lymphs Abs 08/18/2013 0.9  0.7 - 4.0 K/uL Final  . Monocytes Absolute 08/18/2013 0.6  0.1 - 1.0 K/uL Final  . Eosinophils Absolute 08/18/2013 0.0  0.0 - 0.7 K/uL Final  . Basophils Absolute 08/18/2013 0.0  0.0 - 0.1 K/uL Final  . RBC Morphology 08/18/2013 RARE NRBCs   Final   Comment: POLYCHROMASIA PRESENT                          SPHEROCYTES  . Sodium 08/18/2013 134* 135 - 145  mEq/L Final  . Potassium 08/18/2013 4.0  3.5 - 5.1 mEq/L Final  . Chloride 08/18/2013 96  96 - 112 mEq/L Final  . CO2 08/18/2013 26  19 - 32 mEq/L Final  . Glucose, Bld 08/18/2013 172* 70 - 99 mg/dL Final  . BUN 47/82/9562 19  6 - 23 mg/dL Final  . Creatinine, Ser 08/18/2013 0.80  0.50 - 1.10 mg/dL Final  . Calcium 13/05/6577 9.7  8.4 - 10.5 mg/dL Final  . Total Protein 08/18/2013 6.9  6.0 - 8.3 g/dL Final  . Albumin 46/96/2952 3.1* 3.5 - 5.2 g/dL Final  . AST 84/13/2440 69* 0 - 37 U/L Final  . ALT 08/18/2013 255* 0 - 35 U/L Final  . Alkaline Phosphatase 08/18/2013 78  39 - 117 U/L Final  . Total Bilirubin 08/18/2013 0.3  0.3 - 1.2 mg/dL Final  . GFR calc non Af Amer 08/18/2013 71* >90 mL/min Final  . GFR calc Af Amer 08/18/2013 83* >90 mL/min Final   Comment: (NOTE)                          The eGFR has been calculated using the CKD EPI equation.                          This calculation has not been validated in all clinical situations.                          eGFR's persistently <90 mL/min signify possible Chronic Kidney                          Disease.  . Troponin I 08/18/2013 <0.30  <0.30 ng/mL Final   Comment:                                 Due to the release kinetics of cTnI,                          a negative result within the first hours                          of the onset of symptoms  does not rule out                          myocardial infarction with certainty.                          If myocardial infarction is still suspected,                          repeat the test at appropriate intervals.  . Pro B Natriuretic peptide (BNP) 08/18/2013 164.2* 0 - 125 pg/mL Final  . Prothrombin Time 08/18/2013 15.4* 11.6 - 15.2 seconds Final  . INR 08/18/2013 1.25  0.00 - 1.49 Final  . MRSA by PCR 08/19/2013 NEGATIVE  NEGATIVE Final   Comment:                                 The GeneXpert MRSA Assay (FDA                          approved for NASAL specimens                           only), is one component of a                          comprehensive MRSA colonization                          surveillance program. It is not                          intended to diagnose MRSA                          infection nor to guide or                          monitor treatment for                          MRSA infections.  . Sodium 08/19/2013 134* 135 - 145 mEq/L Final  . Potassium 08/19/2013 4.1  3.5 - 5.1 mEq/L Final  . Chloride 08/19/2013 95* 96 - 112 mEq/L Final  . CO2 08/19/2013 28  19 - 32 mEq/L Final  . Glucose, Bld 08/19/2013 182* 70 - 99 mg/dL Final  . BUN 96/01/5408 18  6 - 23 mg/dL Final  . Creatinine, Ser 08/19/2013 0.76  0.50 - 1.10 mg/dL Final  . Calcium 81/19/1478 9.6  8.4 - 10.5 mg/dL Final  . GFR calc non Af Amer 08/19/2013 82* >90 mL/min Final  . GFR calc Af Amer 08/19/2013 >90  >90 mL/min Final   Comment: (NOTE)                          The eGFR has been calculated using the CKD EPI equation.                          This calculation has not been validated in all clinical situations.  eGFR's persistently <90 mL/min signify possible Chronic Kidney                          Disease.  Marland Kitchen Prothrombin Time 08/19/2013 15.8* 11.6 - 15.2 seconds Final  . INR 08/19/2013 1.29  0.00 - 1.49 Final  . Hemoglobin 08/19/2013 9.7* 12.0 - 15.0 g/dL Final  . HCT 16/07/9603 29.6* 36.0 - 46.0 % Final  . Heparin Unfractionated 08/19/2013 1.35* 0.30 - 0.70 IU/mL Final   Comment:                                 IF HEPARIN RESULTS ARE BELOW                          EXPECTED VALUES, AND PATIENT                          DOSAGE HAS BEEN CONFIRMED,                          SUGGEST FOLLOW UP TESTING                          OF ANTITHROMBIN III LEVELS.  Marland Kitchen Fecal Occult Bld 08/19/2013 NEGATIVE  NEGATIVE Final  . WBC 08/20/2013 4.0  4.0 - 10.5 K/uL Final  . RBC 08/20/2013 3.59* 3.87 - 5.11 MIL/uL Final  . Hemoglobin 08/20/2013 9.3* 12.0 - 15.0 g/dL Final  .  HCT 54/06/8118 28.2* 36.0 - 46.0 % Final  . MCV 08/20/2013 78.6  78.0 - 100.0 fL Final  . MCH 08/20/2013 25.9* 26.0 - 34.0 pg Final  . MCHC 08/20/2013 33.0  30.0 - 36.0 g/dL Final  . RDW 14/78/2956 18.8* 11.5 - 15.5 % Final  . Platelets 08/20/2013 287  150 - 400 K/uL Final  . WBC 08/23/2013 4.8  4.0 - 10.5 K/uL Final  . RBC 08/23/2013 3.69* 3.87 - 5.11 MIL/uL Final  . Hemoglobin 08/23/2013 9.7* 12.0 - 15.0 g/dL Final  . HCT 21/30/8657 29.8* 36.0 - 46.0 % Final  . MCV 08/23/2013 80.8  78.0 - 100.0 fL Final  . MCH 08/23/2013 26.3  26.0 - 34.0 pg Final  . MCHC 08/23/2013 32.6  30.0 - 36.0 g/dL Final  . RDW 84/69/6295 20.0* 11.5 - 15.5 % Final  . Platelets 08/23/2013 298  150 - 400 K/uL Final  Infusion on 08/16/2013  Component Date Value Range Status  . WBC 08/16/2013 2.9* 4.0 - 10.5 K/uL Final  . RBC 08/16/2013 3.56* 3.87 - 5.11 MIL/uL Final  . Hemoglobin 08/16/2013 9.0* 12.0 - 15.0 g/dL Final  . HCT 28/41/3244 27.7* 36.0 - 46.0 % Final  . MCV 08/16/2013 77.8* 78.0 - 100.0 fL Final  . MCH 08/16/2013 25.3* 26.0 - 34.0 pg Final  . MCHC 08/16/2013 32.5  30.0 - 36.0 g/dL Final  . RDW 10/09/7251 17.4* 11.5 - 15.5 % Final  . Platelets 08/16/2013 300  150 - 400 K/uL Final  . Neutrophils Relative % 08/16/2013 74  43 - 77 % Final  . Neutro Abs 08/16/2013 2.1  1.7 - 7.7 K/uL Final  . Lymphocytes Relative 08/16/2013 18  12 - 46 % Final  . Lymphs Abs 08/16/2013 0.5* 0.7 - 4.0 K/uL Final  . Monocytes Relative 08/16/2013 8  3 -  12 % Final  . Monocytes Absolute 08/16/2013 0.2  0.1 - 1.0 K/uL Final  . Eosinophils Relative 08/16/2013 0  0 - 5 % Final  . Eosinophils Absolute 08/16/2013 0.0  0.0 - 0.7 K/uL Final  . Basophils Relative 08/16/2013 0  0 - 1 % Final  . Basophils Absolute 08/16/2013 0.0  0.0 - 0.1 K/uL Final  Admission on 08/06/2013, Discharged on 08/10/2013  Component Date Value Range Status  . WBC 08/06/2013 4.0  4.0 - 10.5 K/uL Final  . RBC 08/06/2013 3.27* 3.87 - 5.11 MIL/uL Final  .  Hemoglobin 08/06/2013 7.8* 12.0 - 15.0 g/dL Final  . HCT 14/78/2956 24.3* 36.0 - 46.0 % Final  . MCV 08/06/2013 74.3* 78.0 - 100.0 fL Final  . MCH 08/06/2013 23.9* 26.0 - 34.0 pg Final  . MCHC 08/06/2013 32.1  30.0 - 36.0 g/dL Final  . RDW 21/30/8657 17.1* 11.5 - 15.5 % Final  . Platelets 08/06/2013 316  150 - 400 K/uL Final  . Neutrophils Relative % 08/06/2013 85* 43 - 77 % Final  . Neutro Abs 08/06/2013 3.4  1.7 - 7.7 K/uL Final  . Lymphocytes Relative 08/06/2013 13  12 - 46 % Final  . Lymphs Abs 08/06/2013 0.5* 0.7 - 4.0 K/uL Final  . Monocytes Relative 08/06/2013 2* 3 - 12 % Final  . Monocytes Absolute 08/06/2013 0.1  0.1 - 1.0 K/uL Final  . Eosinophils Relative 08/06/2013 0  0 - 5 % Final  . Eosinophils Absolute 08/06/2013 0.0  0.0 - 0.7 K/uL Final  . Basophils Relative 08/06/2013 0  0 - 1 % Final  . Basophils Absolute 08/06/2013 0.0  0.0 - 0.1 K/uL Final  . Sodium 08/06/2013 134* 135 - 145 mEq/L Final  . Potassium 08/06/2013 4.3  3.5 - 5.1 mEq/L Final  . Chloride 08/06/2013 96  96 - 112 mEq/L Final  . CO2 08/06/2013 28  19 - 32 mEq/L Final  . Glucose, Bld 08/06/2013 171* 70 - 99 mg/dL Final  . BUN 84/69/6295 31* 6 - 23 mg/dL Final  . Creatinine, Ser 08/06/2013 0.89  0.50 - 1.10 mg/dL Final  . Calcium 28/41/3244 9.3  8.4 - 10.5 mg/dL Final  . Total Protein 08/06/2013 6.1  6.0 - 8.3 g/dL Final  . Albumin 10/09/7251 2.6* 3.5 - 5.2 g/dL Final  . AST 66/44/0347 63* 0 - 37 U/L Final  . ALT 08/06/2013 148* 0 - 35 U/L Final  . Alkaline Phosphatase 08/06/2013 60  39 - 117 U/L Final  . Total Bilirubin 08/06/2013 0.3  0.3 - 1.2 mg/dL Final  . GFR calc non Af Amer 08/06/2013 63* >90 mL/min Final  . GFR calc Af Amer 08/06/2013 73* >90 mL/min Final   Comment: (NOTE)                          The eGFR has been calculated using the CKD EPI equation.                          This calculation has not been validated in all clinical situations.                          eGFR's persistently <90  mL/min signify possible Chronic Kidney                          Disease.  Marland Kitchen  Troponin I 08/06/2013 <0.30  <0.30 ng/mL Final   Comment:                                 Due to the release kinetics of cTnI,                          a negative result within the first hours                          of the onset of symptoms does not rule out                          myocardial infarction with certainty.                          If myocardial infarction is still suspected,                          repeat the test at appropriate intervals.  . Lipase 08/06/2013 21  11 - 59 U/L Final  . ABO/RH(D) 08/06/2013 O POS   Final  . Antibody Screen 08/06/2013 NEG   Final  . Sample Expiration 08/06/2013 08/09/2013   Final  . Unit Number 08/06/2013 [id number]   Final  . Blood Component Type 08/06/2013 RBC LR PHER1   Final  . Unit division 08/06/2013 00   Final  . Status of Unit 08/06/2013 ISSUED,FINAL   Final  . Transfusion Status 08/06/2013 OK TO TRANSFUSE   Final  . Crossmatch Result 08/06/2013 Compatible   Final  . Unit Number 08/06/2013 [id number]   Final  . Blood Component Type 08/06/2013 RED CELLS,LR   Final  . Unit division 08/06/2013 00   Final  . Status of Unit 08/06/2013 ISSUED,FINAL   Final  . Transfusion Status 08/06/2013 OK TO TRANSFUSE   Final  . Crossmatch Result 08/06/2013 Compatible   Final  . Prothrombin Time 08/06/2013 14.4  11.6 - 15.2 seconds Final  . INR 08/06/2013 1.14  0.00 - 1.49 Final  . WBC 08/07/2013 6.5  4.0 - 10.5 K/uL Final  . RBC 08/07/2013 3.85* 3.87 - 5.11 MIL/uL Final  . Hemoglobin 08/07/2013 9.9* 12.0 - 15.0 g/dL Final   Comment: DELTA CHECK NOTED                          POST TRANSFUSION SPECIMEN  . HCT 08/07/2013 29.8* 36.0 - 46.0 % Final  . MCV 08/07/2013 77.4* 78.0 - 100.0 fL Final  . MCH 08/07/2013 25.7* 26.0 - 34.0 pg Final  . MCHC 08/07/2013 33.2  30.0 - 36.0 g/dL Final  . RDW 30/86/5784 16.7* 11.5 - 15.5 % Final  . Platelets 08/07/2013 199  150 - 400  K/uL Final   Comment: DELTA CHECK NOTED                          POST TRANSFUSION SPECIMEN  . Sodium 08/07/2013 133* 135 - 145 mEq/L Final  . Potassium 08/07/2013 4.1  3.5 - 5.1 mEq/L Final  . Chloride 08/07/2013 97  96 - 112 mEq/L Final  . CO2 08/07/2013 31  19 - 32 mEq/L Final  . Glucose, Bld 08/07/2013 114* 70 - 99  mg/dL Final  . BUN 16/07/9603 20  6 - 23 mg/dL Final   DELTA CHECK NOTED  . Creatinine, Ser 08/07/2013 0.80  0.50 - 1.10 mg/dL Final  . Calcium 54/06/8118 9.0  8.4 - 10.5 mg/dL Final  . Total Protein 08/07/2013 5.7* 6.0 - 8.3 g/dL Final  . Albumin 14/78/2956 2.5* 3.5 - 5.2 g/dL Final  . AST 21/30/8657 70* 0 - 37 U/L Final  . ALT 08/07/2013 165* 0 - 35 U/L Final  . Alkaline Phosphatase 08/07/2013 50  39 - 117 U/L Final  . Total Bilirubin 08/07/2013 0.5  0.3 - 1.2 mg/dL Final  . GFR calc non Af Amer 08/07/2013 71* >90 mL/min Final  . GFR calc Af Amer 08/07/2013 83* >90 mL/min Final   Comment: (NOTE)                          The eGFR has been calculated using the CKD EPI equation.                          This calculation has not been validated in all clinical situations.                          eGFR's persistently <90 mL/min signify possible Chronic Kidney                          Disease.  Marland Kitchen Hemoglobin 08/06/2013 7.3* 12.0 - 15.0 g/dL Final  . HCT 84/69/6295 23.0* 36.0 - 46.0 % Final  . Hemoglobin 08/06/2013 7.1* 12.0 - 15.0 g/dL Final  . HCT 28/41/3244 22.3* 36.0 - 46.0 % Final  . Hemoglobin 08/07/2013 6.4* 12.0 - 15.0 g/dL Final   Comment: RESULT REPEATED AND VERIFIED                          CRITICAL RESULT CALLED TO, READ BACK BY AND VERIFIED WITH:                           JOHNSON,B @ 0145 ON 08/07/13 BY WOODIE,J  . HCT 08/07/2013 20.0* 36.0 - 46.0 % Final  . Order Confirmation 08/06/2013 ORDER PROCESSED BY BLOOD BANK   Final  . WBC 08/08/2013 9.1  4.0 - 10.5 K/uL Final   WHITE COUNT CONFIRMED ON SMEAR  . RBC 08/08/2013 3.91  3.87 - 5.11 MIL/uL Final  .  Hemoglobin 08/08/2013 10.2* 12.0 - 15.0 g/dL Final  . HCT 10/09/7251 30.2* 36.0 - 46.0 % Final  . MCV 08/08/2013 77.2* 78.0 - 100.0 fL Final  . MCH 08/08/2013 26.1  26.0 - 34.0 pg Final  . MCHC 08/08/2013 33.8  30.0 - 36.0 g/dL Final  . RDW 66/44/0347 16.6* 11.5 - 15.5 % Final  . Platelets 08/08/2013 169  150 - 400 K/uL Final  . WBC 08/09/2013 8.6  4.0 - 10.5 K/uL Final  . RBC 08/09/2013 3.94  3.87 - 5.11 MIL/uL Final  . Hemoglobin 08/09/2013 10.1* 12.0 - 15.0 g/dL Final  . HCT 42/59/5638 30.6* 36.0 - 46.0 % Final  . MCV 08/09/2013 77.7* 78.0 - 100.0 fL Final  . MCH 08/09/2013 25.6* 26.0 - 34.0 pg Final  . MCHC 08/09/2013 33.0  30.0 - 36.0 g/dL Final  . RDW 75/64/3329 16.8* 11.5 - 15.5 % Final  . Platelets 08/09/2013  143* 150 - 400 K/uL Final  . Neutrophils Relative % 08/09/2013 92* 43 - 77 % Final  . Neutro Abs 08/09/2013 7.9* 1.7 - 7.7 K/uL Final  . Lymphocytes Relative 08/09/2013 8* 12 - 46 % Final  . Lymphs Abs 08/09/2013 0.7  0.7 - 4.0 K/uL Final  . Monocytes Relative 08/09/2013 0* 3 - 12 % Final  . Monocytes Absolute 08/09/2013 0.0* 0.1 - 1.0 K/uL Final  . Eosinophils Relative 08/09/2013 0  0 - 5 % Final  . Eosinophils Absolute 08/09/2013 0.0  0.0 - 0.7 K/uL Final  . Basophils Relative 08/09/2013 0  0 - 1 % Final  . Basophils Absolute 08/09/2013 0.0  0.0 - 0.1 K/uL Final  . Sodium 08/09/2013 135  135 - 145 mEq/L Final  . Potassium 08/09/2013 3.7  3.5 - 5.1 mEq/L Final  . Chloride 08/09/2013 96  96 - 112 mEq/L Final  . CO2 08/09/2013 30  19 - 32 mEq/L Final  . Glucose, Bld 08/09/2013 154* 70 - 99 mg/dL Final  . BUN 32/44/0102 7  6 - 23 mg/dL Final   DELTA CHECK NOTED  . Creatinine, Ser 08/09/2013 0.64  0.50 - 1.10 mg/dL Final  . Calcium 72/53/6644 9.0  8.4 - 10.5 mg/dL Final  . Total Protein 08/09/2013 5.9* 6.0 - 8.3 g/dL Final  . Albumin 03/47/4259 2.4* 3.5 - 5.2 g/dL Final  . AST 56/38/7564 118* 0 - 37 U/L Final  . ALT 08/09/2013 310* 0 - 35 U/L Final  . Alkaline  Phosphatase 08/09/2013 59  39 - 117 U/L Final  . Total Bilirubin 08/09/2013 0.4  0.3 - 1.2 mg/dL Final  . GFR calc non Af Amer 08/09/2013 86* >90 mL/min Final  . GFR calc Af Amer 08/09/2013 >90  >90 mL/min Final   Comment: (NOTE)                          The eGFR has been calculated using the CKD EPI equation.                          This calculation has not been validated in all clinical situations.                          eGFR's persistently <90 mL/min signify possible Chronic Kidney                          Disease.  . Fecal Occult Bld 08/06/2013 POSITIVE* NEGATIVE Final  . WBC 08/10/2013 5.9  4.0 - 10.5 K/uL Final  . RBC 08/10/2013 3.77* 3.87 - 5.11 MIL/uL Final  . Hemoglobin 08/10/2013 9.6* 12.0 - 15.0 g/dL Final  . HCT 33/29/5188 29.3* 36.0 - 46.0 % Final  . MCV 08/10/2013 77.7* 78.0 - 100.0 fL Final  . MCH 08/10/2013 25.5* 26.0 - 34.0 pg Final  . MCHC 08/10/2013 32.8  30.0 - 36.0 g/dL Final  . RDW 41/66/0630 17.0* 11.5 - 15.5 % Final  . Platelets 08/10/2013 134* 150 - 400 K/uL Final  . Neutrophils Relative % 08/10/2013 81* 43 - 77 % Final  . Neutro Abs 08/10/2013 4.8  1.7 - 7.7 K/uL Final  . Lymphocytes Relative 08/10/2013 17  12 - 46 % Final  . Lymphs Abs 08/10/2013 1.0  0.7 - 4.0 K/uL Final  . Monocytes Relative 08/10/2013 1* 3 - 12 % Final  .  Monocytes Absolute 08/10/2013 0.1  0.1 - 1.0 K/uL Final  . Eosinophils Relative 08/10/2013 1  0 - 5 % Final  . Eosinophils Absolute 08/10/2013 0.0  0.0 - 0.7 K/uL Final  . Basophils Relative 08/10/2013 0  0 - 1 % Final  . Basophils Absolute 08/10/2013 0.0  0.0 - 0.1 K/uL Final  . Sodium 08/10/2013 135  135 - 145 mEq/L Final  . Potassium 08/10/2013 3.4* 3.5 - 5.1 mEq/L Final  . Chloride 08/10/2013 99  96 - 112 mEq/L Final  . CO2 08/10/2013 30  19 - 32 mEq/L Final  . Glucose, Bld 08/10/2013 118* 70 - 99 mg/dL Final  . BUN 45/40/9811 5* 6 - 23 mg/dL Final  . Creatinine, Ser 08/10/2013 0.68  0.50 - 1.10 mg/dL Final  . Calcium  91/47/8295 8.8  8.4 - 10.5 mg/dL Final  . Total Protein 08/10/2013 5.8* 6.0 - 8.3 g/dL Final  . Albumin 62/13/0865 2.4* 3.5 - 5.2 g/dL Final  . AST 78/46/9629 89* 0 - 37 U/L Final  . ALT 08/10/2013 299* 0 - 35 U/L Final  . Alkaline Phosphatase 08/10/2013 59  39 - 117 U/L Final  . Total Bilirubin 08/10/2013 0.3  0.3 - 1.2 mg/dL Final  . GFR calc non Af Amer 08/10/2013 85* >90 mL/min Final  . GFR calc Af Amer 08/10/2013 >90  >90 mL/min Final   Comment: (NOTE)                          The eGFR has been calculated using the CKD EPI equation.                          This calculation has not been validated in all clinical situations.                          eGFR's persistently <90 mL/min signify possible Chronic Kidney                          Disease.  Admission on 08/04/2013, Discharged on 08/04/2013  Component Date Value Range Status  . WBC 08/04/2013 5.1  4.0 - 10.5 K/uL Final  . RBC 08/04/2013 3.89  3.87 - 5.11 MIL/uL Final  . Hemoglobin 08/04/2013 9.4* 12.0 - 15.0 g/dL Final  . HCT 52/84/1324 29.0* 36.0 - 46.0 % Final  . MCV 08/04/2013 74.6* 78.0 - 100.0 fL Final  . MCH 08/04/2013 24.2* 26.0 - 34.0 pg Final  . MCHC 08/04/2013 32.4  30.0 - 36.0 g/dL Final  . RDW 40/07/2724 17.6* 11.5 - 15.5 % Final  . Platelets 08/04/2013 344  150 - 400 K/uL Final  . Sed Rate 08/04/2013 29* 0 - 22 mm/hr Final  Infusion on 08/02/2013  Component Date Value Range Status  . CA 19-9 08/02/2013 15.2* <35.0 U/mL Final   Performed at [facility]  . CEA 08/02/2013 1.0  0.0 - 5.0 ng/mL Final   Performed at [facility]  . Magnesium 08/02/2013 2.1  1.5 - 2.5 mg/dL Final  . WBC 36/64/4034 6.7  4.0 - 10.5 K/uL Final  . RBC 08/02/2013 3.69* 3.87 - 5.11 MIL/uL Final  . Hemoglobin 08/02/2013 8.8* 12.0 - 15.0 g/dL Final  . HCT 74/25/9563 28.4* 36.0 - 46.0 % Final  . MCV 08/02/2013 77.0* 78.0 - 100.0 fL Final  . MCH 08/02/2013 23.8* 26.0 - 34.0  pg Final  . MCHC 08/02/2013 31.0  30.0 - 36.0 g/dL  Final  . RDW 16/07/9603 17.8* 11.5 - 15.5 % Final  . Platelets 08/02/2013 372  150 - 400 K/uL Final  . Neutrophils Relative % 08/02/2013 74  43 - 77 % Final  . Neutro Abs 08/02/2013 4.9  1.7 - 7.7 K/uL Final  . Lymphocytes Relative 08/02/2013 16  12 - 46 % Final  . Lymphs Abs 08/02/2013 1.0  0.7 - 4.0 K/uL Final  . Monocytes Relative 08/02/2013 9  3 - 12 % Final  . Monocytes Absolute 08/02/2013 0.6  0.1 - 1.0 K/uL Final  . Eosinophils Relative 08/02/2013 2  0 - 5 % Final  . Eosinophils Absolute 08/02/2013 0.1  0.0 - 0.7 K/uL Final  . Basophils Relative 08/02/2013 0  0 - 1 % Final  . Basophils Absolute 08/02/2013 0.0  0.0 - 0.1 K/uL Final  . Sodium 08/02/2013 140  135 - 145 mEq/L Final  . Potassium 08/02/2013 3.8  3.5 - 5.1 mEq/L Final  . Chloride 08/02/2013 103  96 - 112 mEq/L Final  . CO2 08/02/2013 31  19 - 32 mEq/L Final  . Glucose, Bld 08/02/2013 117* 70 - 99 mg/dL Final  . BUN 54/06/8118 8  6 - 23 mg/dL Final  . Creatinine, Ser 08/02/2013 0.71  0.50 - 1.10 mg/dL Final  . Calcium 14/78/2956 9.2  8.4 - 10.5 mg/dL Final  . Total Protein 08/02/2013 6.7  6.0 - 8.3 g/dL Final  . Albumin 21/30/8657 2.8* 3.5 - 5.2 g/dL Final  . AST 84/69/6295 32  0 - 37 U/L Final  . ALT 08/02/2013 54* 0 - 35 U/L Final  . Alkaline Phosphatase 08/02/2013 75  39 - 117 U/L Final  . Total Bilirubin 08/02/2013 0.3  0.3 - 1.2 mg/dL Final  . GFR calc non Af Amer 08/02/2013 84* >90 mL/min Final  . GFR calc Af Amer 08/02/2013 >90  >90 mL/min Final   Comment: (NOTE)                          The eGFR has been calculated using the CKD EPI equation.                          This calculation has not been validated in all clinical situations.                          eGFR's persistently <90 mL/min signify possible Chronic Kidney                          Disease.    PATHOLOGY:  Urinalysis    Component Value Date/Time   COLORURINE YELLOW 07/31/2013 0552   APPEARANCEUR CLEAR 07/31/2013 0552   LABSPEC 1.020  07/31/2013 0552   PHURINE 6.0 07/31/2013 0552   GLUCOSEU NEGATIVE 07/31/2013 0552   HGBUR NEGATIVE 07/31/2013 0552   BILIRUBINUR NEGATIVE 07/31/2013 0552   KETONESUR NEGATIVE 07/31/2013 0552   PROTEINUR NEGATIVE 07/31/2013 0552   UROBILINOGEN 0.2 07/31/2013 0552   NITRITE NEGATIVE 07/31/2013 0552   LEUKOCYTESUR SMALL* 07/31/2013 0552    RADIOGRAPHIC STUDIES: Dg Chest 2 View  08/18/2013   CLINICAL DATA:  Shortness of breath. History of gastric cancer. Ex-smoker.  EXAM: CHEST  2 VIEW  COMPARISON:  08/06/2013.  FINDINGS: The cardiac silhouette remains near the upper limit of normal  in size. Clear lungs. Stable left subclavian porta catheter. Thoracic spine degenerative changes. Mild bilateral shoulder degenerative changes.  IMPRESSION: No acute abnormality.   Electronically Signed   By: Gordan Payment M.D.   On: 08/18/2013 22:08   Ct Head Wo Contrast  08/04/2013   CLINICAL DATA:  Nausea and headache.  On chemotherapy.  EXAM: CT HEAD WITHOUT CONTRAST  TECHNIQUE: Contiguous axial images were obtained from the base of the skull through the vertex without intravenous contrast.  COMPARISON:  None available for comparison at time of study interpretation.  FINDINGS: The ventricles and sulci are normal for age. No intraparenchymal hemorrhage, mass effect nor midline shift. Patchy supratentorial white matter hypodensities are within normal range for patient's age and though non-specific suggest sequelae of chronic small vessel ischemic disease. No acute large vascular territory infarcts.  No abnormal extra-axial fluid collections. Basal cisterns are patent. Moderate calcific atherosclerosis of the carotid siphons.  No skull fracture. Visualized paranasal sinuses and mastoid aircells are well-aerated. The included ocular globes and orbital contents are non-suspicious.  IMPRESSION: No acute intracranial process ; normal noncontrast CT of the head for age.   Electronically Signed   By: Awilda Metro   On:  08/04/2013 03:04   Ct Angio Chest Pe W/cm &/or Wo Cm  08/19/2013   CLINICAL DATA:  Shortness of breath. Undergoing chemotherapy for gastric carcinoma.  EXAM: CT ANGIOGRAPHY CHEST WITH CONTRAST  TECHNIQUE: Multidetector CT imaging of the chest was performed using the standard protocol during bolus administration of intravenous contrast. Multiplanar CT image reconstructions including MIPs were obtained to evaluate the vascular anatomy.  CONTRAST:  OMNIPAQUE IOHEXOL 350 MG/ML SOLN  COMPARISON:  Chest radiographs obtained earlier today. PET-CT dated 06/24/2013. Abdomen and pelvis CT dated 07/07/2013.  FINDINGS: Multiple bilateral pulmonary arterial filling defects. These include the upper and lower lobes bilaterally and the right middle lobe. No large central pulmonary arterial filling defects are seen. No lung nodules or enlarged lymph nodes. Thoracic spine degenerative changes. Left adrenal mass measuring 2.5 x 2.0 cm on image number 80 and partially included smaller right adrenal mass measuring 1.4 x 0.8 cm on image number 85. These have not changed significantly since 07/07/2013. Diffuse gastric wall thickening is again noted.  Review of the MIP images confirms the above findings.  IMPRESSION: 1. Multiple small to moderate-sized bilateral pulmonary emboli. 2. Stable bilateral adrenal adenomas. 3. Persistent gastric wall thickening compatible with the patient's known gastric carcinoma. Critical Value/emergent results were called by telephone at the time of interpretation on 08/19/2013 at 12:10 AM to Dr.STEPHEN RANCOUR , who verbally acknowledged these results.   Electronically Signed   By: Gordan Payment M.D.   On: 08/19/2013 00:12   Ir Ivc Filter Plmt / S&i /img Guid/mod Sed  08/21/2013   CLINICAL DATA:  DVT, PE, GI bleed, patient cannot be adequately anticoagulated  EXAM: ULTRASOUND GUIDANCE FOR VASCULAR ACCESS  IVC CATHETERIZATION AND VENOGRAM  IVC FILTER INSERTION  Date:  11/15/201411/15/2014 9:42 AM   Radiologist:  Judie Petit. Ruel Favors, MD  Guidance:  Ultrasound and fluoroscopic  MEDICATIONS AND MEDICAL HISTORY: 2 mg Versed, 50 mcg fentanyl  ANESTHESIA/SEDATION: 10 min  CONTRAST:  45mL OMNIPAQUE IOHEXOL 300 MG/ML  SOLN  FLUOROSCOPY TIME:  1 min  PROCEDURE: Informed consent was obtained from the patient following explanation of the procedure, risks, benefits and alternatives. The patient understands, agrees and consents for the procedure. All questions were addressed. A time out was performed.  Maximal barrier sterile technique utilized  including caps, mask, sterile gowns, sterile gloves, large sterile drape, hand hygiene, and betadine prep.  Under sterile condition and local anesthesia, right internal jugular venous access was performed with ultrasound. Over a guide wire, the IVC filter delivery sheath and inner dilator were advanced into the IVC just above the IVC bifurcation. Contrast injection was performed for an IVC venogram.  IVC VENOGRAM: The IVC is patent. No evidence of thrombus, stenosis, or occlusion. No variant venous anatomy. The renal veins are identified at L1-2.  IVC FILTER INSERTION: Through the delivery sheath, the Bard Denali IVC filter was deployed in the infrarenal IVC at the L2-3 level just below the renal veins and above the IVC bifurcation. Contrast injection confirmed position. There is good apposition of the filter against the IVC.  The delivery sheath was removed and hemostasis was obtained with compression for 5 minutes. The patient tolerated the procedure well. No immediate complications.  COMPLICATIONS: No immediate  IMPRESSION: Ultrasound and fluoroscopically guided infrarenal IVC filter insertion.   Electronically Signed   By: Ruel Favors M.D.   On: 08/21/2013 09:55   US Venous Img Lower Bilateral  08/19/2013   CLINICAL DATA:  Leg pain.  EXAM: VENOUS DOPPLER ULTRASOUND OF BILATERAL LOWER EXTREMITIES  TECHNIQUE: Gray-scale sonography with graded compression, as well as color  Doppler and duplex ultrasound, were performed to evaluate the deep venous system from the level of the common femoral vein through the popliteal and proximal calf veins. Spectral Doppler was utilized to evaluate flow at rest and with distal augmentation maneuvers.  COMPARISON:  None.  FINDINGS: Thrombus within deep veins: There is nonocclusive deep venous thrombosis of the distal superficial femoral vein and popliteal vein of the left leg. No deep venous thrombosis of the right leg.  Compressibility of deep veins: The area of DVT of the left leg is not compressible. The deep veins of the right leg are compressible.  Duplex waveform respiratory phasicity: Normal on the right. Abnormal at the site of DVT on the left.  Duplex waveform response to augmentation:  Normal on the right.  Venous reflux:  None visualized.  Other findings:  Subcutaneous edema noted in the left leg.  IMPRESSION: Nonocclusive deep venous thrombosis of the distal left superficial femoral vein and left popliteal vein. Normal deep veins of the right leg.   Electronically Signed   By: Geanie Cooley M.D.   On: 08/19/2013 18:27   Dg Abd Acute W/chest  08/06/2013   CLINICAL DATA:  History of bleeding ulcer  EXAM: ACUTE ABDOMEN SERIES (ABDOMEN 2 VIEW & CHEST 1 VIEW)  COMPARISON:  None.  FINDINGS: Cardiac shadow is within normal limits. A left-sided chest wall port is seen with the catheter tip in the proximal superior vena cava. Lungs are clear bilaterally.  The abdomen shows a nonobstructive bowel gas pattern. Degenerative changes of the lumbar spine are seen. No abnormal mass or abnormal calcifications are noted. A left hip replacement is seen.  IMPRESSION: No acute abnormality noted.   Electronically Signed   By: Alcide Clever M.D.   On: 08/06/2013 16:43    ASSESSMENT:  #1. Stage IV gastric cancer, HER-2/neu negative, status post 1 cycle of ECF with discontinuation of 5-FU infusion prematurely on 08/18/2013 due to development of bilateral  pulmonary emboli. In view of the fascia developed significant mucositis and GI bleed as a result of initial cycle of ECF, treatment will be switched to FOLFOX without 5-FU bolus beginning on 09/06/2013. Patient originally been scheduled to be treated  with preop chemotherapy and then possible surgery but developed a gastric perforation prior to the initiation of chemotherapy. #2. Depression. #3. 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   PLAN:  #1. Discussion was held with  the patient regarding switching to oxaliplatin plus leucovorin 5-FU infusion without 5-FU bolus. The patient is in agreement with this strategy. #2. Transfuse as needed #3. Lexapro 10 mg daily. #4. Followup in one week with office visit and initiation of salvage treatment. Plan is to deliver 2 cycles at 2 week intervals and then repeat CT scan.   All questions were answered. The patient knows to call the clinic with any problems, questions or concerns. We can certainly see the patient much sooner if necessary.   I spent 25 minutes counseling the patient face to face. The total time spent in the appointment was 30 minutes.    Maurilio Lovely, MD 08/30/2013 9:41 AM

## 2013-08-30 NOTE — Patient Instructions (Signed)
.  [facility] Discharge Instructions  RECOMMENDATIONS MADE BY THE CONSULTANT AND ANY TEST RESULTS WILL BE SENT TO YOUR REFERRING PHYSICIAN.  EXAM FINDINGS BY THE PHYSICIAN TODAY AND SIGNS OR SYMPTOMS TO REPORT TO CLINIC OR PRIMARY PHYSICIAN: Exam and findings as discussed by Dr. Zigmund Daniel. We will switch you to [facility] next Tuesday I will talk to [facility] about doing your teaching  MEDICATIONS PRESCRIBED:  Dr. Zigmund Daniel is calling in something for depression  INSTRUCTIONS/FOLLOW-UP: 1 week for chemo and Dr. Visit.  Thank you for choosing [name] to provide your oncology and hematology care.  To afford each patient quality time with our providers, please arrive at least 15 minutes before your scheduled appointment time.  With your help, our goal is to use those 15 minutes to complete the necessary work-up to ensure our physicians have the information they need to help with your evaluation and healthcare recommendations.    Effective January 1st, 2014, we ask that you re-schedule your appointment with our physicians should you arrive 10 or more minutes late for your appointment.  We strive to give you quality time with our providers, and arriving late affects you and other patients whose appointments are after yours.    Again, thank you for choosing [facility].  Our hope is that these requests will decrease the amount of time that you wait before being seen by our physicians.       _____________________________________________________________  Should you have questions after your visit to [facility], please contact our office at [phone] between the hours of 8:30 a.m. and 5:00 p.m.  Voicemails left after 4:30 p.m. will not be returned until the following business day.  For prescription refill requests, have your pharmacy contact our office with your prescription refill request.

## 2013-08-30 NOTE — ED Provider Notes (Signed)
CSN: [id number]     Arrival date & time 08/30/13  1930 History   First MD Initiated Contact with Patient 08/30/13 2012    Scribed for [name], MD, the patient was seen in room APA05/APA05. This chart was scribed by Lewanda Rife, ED scribe. Patient's care was started at 8:16 PM  Chief Complaint  Patient presents with  . Weakness   (Consider location/radiation/quality/duration/timing/severity/associated sxs/prior Treatment) The history is provided by the patient and a relative. No language interpreter was used.   HPI Comments: [name] is a 72 y.o. female who presents to the Emergency Department with PMHx of stomach CA, and COPD complaining of generalized weakness onset today after leaving the cancer center. Reports associated shortness of breath. Denies any aggravating or alleviating factors. Reports she was seen at cancer center  earlier this morning and was told to f/u at cancer center in the morning (because her "Calcium was low"), but "felt too weak and came to the ER." Denies associated cough, abdominal pain, emesis, bloody stools, chest pain, and fever. Reports currently taking Lovenox.  Son came out of the room and said his mother asks him to bring her to ED once or twice a week when she gets a call back from the cancer center so she doesn't have to worry about the appointment in the morning.  Past Medical History  Diagnosis Date  . Hypertension   . Paroxysmal SVT (supraventricular tachycardia)   . Hypercholesteremia   . GERD (gastroesophageal reflux disease)   . PUD (peptic ulcer disease)   . DVT (deep venous thrombosis) 2007  . Adrenal adenoma     left  . Thyroid nodule     left  . H. pylori infection   . Diverticulosis   . 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 Diarrhea     recurrent  . Arthritis   . Hiatal hernia   . Cancer     adenocarcinoma   Past Surgical History  Procedure Laterality Date  . Total knee  arthroplasty  bilateral knee  . Hip  replacement  left  . Appendectomy    . Cholecystectomy    . Abdominal hysterectomy    . Ablation      of SVT  . Colonoscopy  09/13/2009    WUJ:[id number]/AOZHYQMV internal hemorrhoids  . Esophagogastroduodenoscopy  09/13/2009    HQI:ONGEXB/  . Esophagogastroduodenoscopy N/A 06/11/2013    Procedure: ESOPHAGOGASTRODUODENOSCOPY (EGD);  Surgeon: West Bali, MD;  Location: [facility] END[facility] SUITE;  Service: Endoscopy;  Laterality: N/A;  2:15-moved to 1045 [name] notified pt  . Portacath placement Left 07/06/2013    Procedure: INSERTION PORT-A-CATH;  Surgeon: Almond Lint, MD;  Location: [facility] ORS;  Service: General;  Laterality: Left;  . Laparotomy N/A 07/07/2013    Procedure: EXPLORATORY LAPAROTOMY;  Surgeon: Dalia Heading, MD;  Location: [facility] ORS;  Service: General;  Laterality: N/A;  . Gastrorrhaphy N/A 07/07/2013    Procedure: Ferrel Logan;  Surgeon: Dalia Heading, MD;  Location: [facility] ORS;  Service: General;  Laterality: N/A;  . Breast lumpectomy      left  . Cardiac catheterization  2009    normal coronary arteries  . Joint replacement     Family History  Problem Relation Age of Onset  . Colon cancer Neg Hx   . Cancer Mother     leukemia  . Cancer Sister     breast  . Heart disease Brother   . Heart disease Sister  History  Substance Use Topics  . Smoking status: Former Smoker -- 15 years  . Smokeless tobacco: Never Used     Comment: Quit smoking x 3 weeks  . Alcohol Use: No   OB History   Grav Para Term Preterm Abortions TAB SAB Ect Mult Living                 Review of Systems  Neurological: Positive for weakness.  All other systems reviewed and are negative.   A complete 10 system review of systems was obtained and all systems are negative except as noted in the HPI and PMHx.    Allergies  Review of patient's allergies indicates no known allergies.  Home Medications   Current Outpatient Rx  Name  Route  Sig  Dispense  Refill   . dicyclomine (BENTYL) 20 MG tablet   Oral   Take 20 mg by mouth 2 (two) times daily at 8 am and 10 pm.         . diltiazem (TIAZAC) 360 MG 24 hr capsule   Oral   Take 360 mg by mouth every morning.          [name] Calcium (STOOL SOFTENER PO)   Oral   Take 1 capsule by mouth 2 (two) times daily.         Marland Kitchen enoxaparin (LOVENOX) 120 MG/0.8ML injection   Subcutaneous   Inject 0.8 mLs (120 mg total) into the skin daily. Each evening at same time 9 pm.   30 Syringe   0   . escitalopram (LEXAPRO) 10 MG tablet   Oral   Take 1 tablet (10 mg total) by mouth daily.   30 tablet   6   . fish oil-omega-3 fatty acids 1000 MG capsule   Oral   Take 1 g by mouth daily.         . fluconazole (DIFLUCAN) 100 MG tablet   Oral   Take 1 tablet (100 mg total) by mouth daily.   14 tablet   0   . lidocaine-prilocaine (EMLA) cream   Topical   Apply 1 application topically as needed (for port access).          . Linaclotide (LINZESS) 145 MCG CAPS capsule   Oral   Take 145 mcg by mouth daily.         Marland Kitchen LORazepam (ATIVAN) 0.5 MG tablet   Oral   Take 1 tablet (0.5 mg total) by mouth every 6 (six) hours as needed (nausea/vomiting).   30 tablet   0   . lovastatin (MEVACOR) 40 MG tablet   Oral   Take 2 tablets (80 mg total) by mouth at bedtime.   60 tablet   11   . magnesium hydroxide (MILK OF MAGNESIA) 400 MG/5ML suspension   Oral   Take 30 mLs by mouth daily as needed for constipation.         . metoCLOPramide (REGLAN) 10 MG tablet   Oral   Take 1 tablet (10 mg total) by mouth 4 (four) times daily.   100 tablet   3   . metoprolol succinate (TOPROL-XL) 25 MG 24 hr tablet   Oral   Take 3 tablets (75 mg total) by mouth daily. Take with or immediately following a meal.   30 tablet   0   . ondansetron (ZOFRAN) 8 MG tablet   Oral   Take 1 tablet (8 mg total) by mouth every 8 (eight) hours as needed for  nausea or vomiting.   20 tablet   0   . oxyCODONE (OXY  IR/ROXICODONE) 5 MG immediate release tablet   Oral   Take 1 tablet (5 mg total) by mouth every 4 (four) hours as needed for pain.   60 tablet   0   . pantoprazole (PROTONIX) 40 MG tablet   Oral   Take 1 tablet (40 mg total) by mouth 2 (two) times daily.   60 tablet   2   . Probiotic Product (PROBIOTIC FORMULA PO)   Oral   Take 1 tablet by mouth every morning.         . prochlorperazine (COMPAZINE) 10 MG tablet   Oral   Take 1 tablet (10 mg total) by mouth every 6 (six) hours.   100 tablet   3   . temazepam (RESTORIL) 7.5 MG capsule   Oral   Take 7.5 mg by mouth at bedtime as needed for sleep.          BP 131/77  Pulse 73  Temp(Src) 98.6 F (37 C) (Oral)  Resp 16  Ht 5\' 6"  (1.676 m)  Wt 186 lb (84.369 kg)  BMI 30.04 kg/m2  SpO2 100% Physical Exam  Nursing note and vitals reviewed. Constitutional: She is oriented to person, place, and time. She appears well-developed and well-nourished. No distress.  HENT:  Head: Normocephalic and atraumatic.  Mouth/Throat: Oropharynx is clear and moist. No oropharyngeal exudate.  Eyes: Conjunctivae and EOM are normal.  Neck: Neck supple. No tracheal deviation present.  Cardiovascular: Normal rate and regular rhythm.   No murmur heard. Pulmonary/Chest: Effort normal and breath sounds normal. No respiratory distress.  Abdominal: Soft. There is no tenderness.  Musculoskeletal: Normal range of motion. She exhibits no edema.  Neurological: She is alert and oriented to person, place, and time.  Skin: Skin is warm and dry.  Psychiatric: She has a normal mood and affect. Her behavior is normal.    ED Course  Procedures (including critical care time)  COORDINATION OF CARE:  Nursing notes reviewed. Vital signs reviewed. Initial pt interview and examination performed.   8:43 PM-Discussed work up plan with pt at bedside, which includes CXR. Pt agrees with plan.   Treatment plan initiated: Medications  albuterol (PROVENTIL) (5  MG/ML) 0.5% nebulizer solution 5 mg (5 mg Nebulization Given 08/30/13 2101)  ipratropium (ATROVENT) nebulizer solution 0.5 mg (0.5 mg Nebulization Given 08/30/13 2101)     Initial diagnostic testing ordered.    Labs Review Labs Reviewed - No data to display Imaging Review No results found.    MDM  No diagnosis found. Patient is a 72 year old female with history of stage IV as gastric cancer who presents with complaints of an abnormal laboratory value. She tells me she was sent here because your calcium was low. She had blood drawn this morning at the cancer Center and states she was called after she returned home. I've reviewed her medical record and I see no documentation of this in her calcium level appears very normal. Remainder of the laboratory studies appear very normal as well. She is given an albuterol treatment and chest x-ray was performed which was unremarkable. She appears very stable and has a followup appointment in the morning with her oncologist. I feel as though she is stable for discharge and that no further workup is indicated.  I did have a conversation with her son who states she is very anxious and wants to come to the emergency department for  reassurance with great frequency.   I personally performed the services described in this documentation, which was scribed in my presence. The recorded information has been reviewed and is accurate.        [name], MD 08/30/13 [phone]

## 2013-08-31 ENCOUNTER — Encounter (HOSPITAL_BASED_OUTPATIENT_CLINIC_OR_DEPARTMENT_OTHER): Payer: Medicare Other

## 2013-08-31 DIAGNOSIS — D62 Acute posthemorrhagic anemia: Secondary | ICD-10-CM

## 2013-08-31 DIAGNOSIS — D500 Iron deficiency anemia secondary to blood loss (chronic): Secondary | ICD-10-CM

## 2013-08-31 MED ORDER — OXYCODONE HCL 5 MG PO TABS: 10.0000 mg | ORAL_TABLET | ORAL | Status: AC | PRN

## 2013-08-31 MED ORDER — HEPARIN SOD (PORK) LOCK FLUSH 100 UNIT/ML IV SOLN
500.0000 [IU] | Freq: Once | INTRAVENOUS | Status: AC
  Administered 2013-08-31: 500 [IU] via INTRAVENOUS
  Filled 2013-08-31: qty 5

## 2013-08-31 MED ORDER — SODIUM CHLORIDE 0.9 % IV SOLN
1020.0000 mg | Freq: Once | INTRAVENOUS | Status: AC
  Administered 2013-08-31: 1020 mg via INTRAVENOUS
  Filled 2013-08-31: qty 34

## 2013-08-31 MED ORDER — SODIUM CHLORIDE 0.9 % IJ SOLN
10.0000 mL | INTRAMUSCULAR | Status: DC | PRN
  Administered 2013-08-31: 10 mL via INTRAVENOUS

## 2013-08-31 MED ORDER — OXYCODONE HCL 5 MG PO TABS
10.0000 mg | ORAL_TABLET | ORAL | Status: DC | PRN
  Administered 2013-08-31: 10 mg via ORAL
  Filled 2013-08-31: qty 2

## 2013-08-31 MED ORDER — SODIUM CHLORIDE 0.9 % IV SOLN
INTRAVENOUS | Status: DC
  Administered 2013-08-31: 09:00:00 via INTRAVENOUS

## 2013-08-31 NOTE — Addendum Note (Signed)
Addended by: Alla German A on: 08/31/2013 10:45 AM   Modules accepted: Orders

## 2013-08-31 NOTE — Progress Notes (Signed)
C/o pain in legs. Dr Zigmund Daniel notified.  Oxycodone 10 mg given P.O.   Expressed relief of pain prior to leaving clinic at 0940. Also tolerated fereheme infusion well.

## 2013-08-31 NOTE — Patient Instructions (Addendum)
[facility]   CHEMOTHERAPY INSTRUCTIONS  Oxaliplatin - anaphylactic reaction, neurotoxicity (i.e., headache, fatigue, difficulty sleeping, pain). Peripheral neuropathy (numbness/tingling/burning in hands/fingers/feet/toes) - will be aggravated by cold/cool temperatures. We need to know when you develop peripheral neuropathy so that we can monitor it and treat if necessary. Nausea/vomiting, diarrhea, bone marrow suppression (lowers white blood cells (fight infection), lowers red blood cells (make up your blood), lowers platelets (help blood to clot). Pulmonary fibrosis. Once you have received Oxaliplatin do NOT eat or drinking anything cold/cool for 5-10 days! Do NOT breathe in cold/cool air and do NOT touch anything cold for 5-10 days. The time frame varies from patient to patient on the length of time you must abstain from the above mentioned. Best advice is to wait at least 5 days before attempting to reintroduce cold/cool back into life. Slowly reintroduce cool/cold things! Wear gloves when getting items out of the refrigerator (of course, these would be things you are going to heat to eat)!  Leucovorin - this is a medication that is not chemo but given with chemo. This med "rescues" the healthy cells before we administer the drug 5FU. This makes the 5FU work better.   5FU: bone marrow suppression (low white blood cells - wbcs fight infection, low red blood cells - rbcs make up your blood, low platelets - this is what makes your blood clot, nausea/vomiting, diarrhea, mouth sores, hair loss, dry skin, ocular toxicities (increased tear production, sensitivity to light). You must wear sunscreen/sunglasses. Cover your skin when out in sunlight. You will get burned very easily. (this medication - you will wear on a pump for a couple of days @ home).   POTENTIAL SIDE EFFECTS OF TREATMENT: Increased Susceptibility to Infection, Vomiting, Hair Thinning, Changes in Character  of Skin and Nails (brittleness, dryness,etc.), Bone Marrow Suppression, Abdominal Cramping, Nausea, Diarrhea, Sun Sensitivity and Mouth Sores   SELF IMAGE NEEDS AND REFERRALS MADE: Obtain hair accessories as soon as possible (wigs, scarves, turbans,caps,etc.)   EDUCATIONAL MATERIALS GIVEN AND REVIEWED: Specific Instructions Sheets Oxaliplatin, 5FU, Leucovorin, Dexamethasone, Zofran, Reglan, Compazine   SELF CARE ACTIVITIES WHILE ON CHEMOTHERAPY: Increase your fluid intake 48 hours prior to treatment and drink at least 2 quarts per day after treatment., No alcohol intake., No aspirin or other medications unless approved by your oncologist., Eat foods that are light and easy to digest., No fried, fatty, or spicy foods immediately before or after treatment., Have teeth cleaned professionally before starting treatment. Keep dentures and partial plates clean., Use soft toothbrush and do not use mouthwashes that contain alcohol. Biotene is a good mouthwash. Use warm salt water gargles (1 teaspoon salt per 1 quart warm water) before and after meals and at bedtime. Or you may rinse with 2 tablespoons of three -percent hydrogen peroxide mixed in eight ounces of water., Always use sunscreen with SPF (Sun Protection Factor) of 30 or higher., Use your nausea medication as directed to prevent nausea., Use your stool softener or laxative as directed to prevent constipation. and Use your anti-diarrheal medication as directed to stop diarrhea.  Please wash your hands for at least 30 seconds using warm soapy water. Handwashing is the #1 way to prevent the spread of germs. Stay away from sick people or people who are getting over a cold. If you develop respiratory systems such as green/yellow mucus production or productive cough or persistent cough let us know and we will see if you need an antibiotic. It is a good  idea to keep a pair of gloves on when going into grocery stores/[employer] to decrease your risk of coming  into contact with germs on the carts, etc. Carry alcohol hand gel with you at all times and use it frequently if out in public. All foods need to be cooked thoroughly. No raw foods. No medium or undercooked meats, eggs. If your food is cooked medium well, it does not need to be hot pink or saturated with bloody liquid at all. Vegetables and fruits need to be washed/rinsed under the faucet with a dish detergent before being consumed. You can eat raw fruits and vegetables unless we tell you otherwise but it would be best if you cooked them or bought frozen. Do not eat off of salad bars or hot bars unless you really trust the cleanliness of the restaurant. If you need dental work, please let us know before you go for your appointment so that we can coordinate the best possible time for you in regards to your chemo regimen. You need to also let your dentist know that you are actively taking chemo. We may need to do labs prior to your dental appointment. We also want your bowels moving at least every other day. If this is not happening, we need to know so that we can get you on a bowel regimen to help you go.     MEDICATIONS: You have been given prescriptions for the following medications:  Reglan/metoclopramide 10mg  tablet. Take 1 tablet four times a day. (for nausea/vomiting)  Compazine/prochlorperazine 10mg  tablet. Take 1 tablet four times a day as needed for nausea/vomiting.   Zofran 8mg  tablet. May take 1 tablet every 8 hours as needed for nausea/vomiting.     Over-the-Counter Meds:  Colace - this is a stool softener. Take 100mg  capsule 2-6 times a day as needed. If you have to take more than 6 capsules of Colace a day call the Cancer Center.  Senna - this is a mild laxative used to treat mild constipation. May take up to 3 tabs nightly @ bedtime as needed for mild constipation.  Milk of Magnesia - this is a laxative used to treat moderate to severe constipation. May take 2-4 tablespoons every 8  hours as needed. May increase to 8 tablespoons x 1 dose and if no bowel movement call the Cancer Center.  Imodium - this is for diarrhea. Take 2 tabs after 1st loose stool and then 1 tab after each loose stool until you go a total of 12 hours without a loose stool. Call Cancer Center if loose stools continue. If it is the weekend, take Imodium. If it is weekday, call us and we can in a prescription strength anti-diarrheal medication.     SYMPTOMS TO REPORT AS SOON AS POSSIBLE AFTER TREATMENT:  FEVER GREATER THAN 100.5 F  CHILLS WITH OR WITHOUT FEVER  NAUSEA AND VOMITING THAT IS NOT CONTROLLED WITH YOUR NAUSEA MEDICATION  UNUSUAL SHORTNESS OF BREATH  UNUSUAL BRUISING OR BLEEDING  TENDERNESS IN MOUTH AND THROAT WITH OR WITHOUT PRESENCE OF ULCERS  URINARY PROBLEMS  BOWEL PROBLEMS  UNUSUAL RASH    Wear comfortable clothing and clothing appropriate for easy access to any Portacath or PICC line. Let us know if there is anything that we can do to make your therapy better!      I have been informed and understand all of the instructions given to me and have received a copy. I have been instructed to call the clinic [phone]  or my family physician as soon as possible for continued medical care, if indicated. I do not have any more questions at this time but understand that I may call the Cancer Center or the Patient Navigator at [phone] during office hours should I have questions or need assistance in obtaining follow-up care.      _________________________________________      _______________     __________ Signature of Patient or Authorized Representative        Date                            Time      _________________________________________ Nurse's Signature      Oxaliplatin Injection What is this medicine? OXALIPLATIN (ox AL i PLA tin) is a chemotherapy drug. It targets fast dividing cells, like cancer cells, and causes these cells to die. This  medicine is used to treat cancers of the colon and rectum, and many other cancers. This medicine may be used for other purposes; ask your health care provider or pharmacist if you have questions. COMMON BRAND NAME(S): Eloxatin What should I tell my health care provider before I take this medicine? They need to know if you have any of these conditions: -kidney disease -an unusual or allergic reaction to oxaliplatin, other chemotherapy, other medicines, foods, dyes, or preservatives -pregnant or trying to get pregnant -breast-feeding How should I use this medicine? This drug is given as an infusion into a vein. It is administered in a hospital or clinic by a specially trained health care professional. Talk to your pediatrician regarding the use of this medicine in children. Special care may be needed. Overdosage: If you think you have taken too much of this medicine contact a poison control center or emergency room at once. NOTE: This medicine is only for you. Do not share this medicine with others. What if I miss a dose? It is important not to miss a dose. Call your doctor or health care professional if you are unable to keep an appointment. What may interact with this medicine? -medicines to increase blood counts like filgrastim, pegfilgrastim, sargramostim -probenecid -some antibiotics like amikacin, gentamicin, neomycin, polymyxin B, streptomycin, tobramycin -zalcitabine Talk to your doctor or health care professional before taking any of these medicines: -acetaminophen -aspirin -ibuprofen -ketoprofen -naproxen This list may not describe all possible interactions. Give your health care provider a list of all the medicines, herbs, non-prescription drugs, or dietary supplements you use. Also tell them if you smoke, drink alcohol, or use illegal drugs. Some items may interact with your medicine. What should I watch for while using this medicine? Your condition will be monitored carefully  while you are receiving this medicine. You will need important blood work done while you are taking this medicine. This medicine can make you more sensitive to cold. Do not drink cold drinks or use ice. Cover exposed skin before coming in contact with cold temperatures or cold objects. When out in cold weather wear warm clothing and cover your mouth and nose to warm the air that goes into your lungs. Tell your doctor if you get sensitive to the cold. This drug may make you feel generally unwell. This is not uncommon, as chemotherapy can affect healthy cells as well as cancer cells. Report any side effects. Continue your course of treatment even though you feel ill unless your doctor tells you to stop. In some cases, you may be given additional medicines  to help with side effects. Follow all directions for their use. Call your doctor or health care professional for advice if you get a fever, chills or sore throat, or other symptoms of a cold or flu. Do not treat yourself. This drug decreases your body's ability to fight infections. Try to avoid being around people who are sick. This medicine may increase your risk to bruise or bleed. Call your doctor or health care professional if you notice any unusual bleeding. Be careful brushing and flossing your teeth or using a toothpick because you may get an infection or bleed more easily. If you have any dental work done, tell your dentist you are receiving this medicine. Avoid taking products that contain aspirin, acetaminophen, ibuprofen, naproxen, or ketoprofen unless instructed by your doctor. These medicines may hide a fever. Do not become pregnant while taking this medicine. Women should inform their doctor if they wish to become pregnant or think they might be pregnant. There is a potential for serious side effects to an unborn child. Talk to your health care professional or pharmacist for more information. Do not breast-feed an infant while taking this  medicine. Call your doctor or health care professional if you get diarrhea. Do not treat yourself. What side effects may I notice from receiving this medicine? Side effects that you should report to your doctor or health care professional as soon as possible: -allergic reactions like skin rash, itching or hives, swelling of the face, lips, or tongue -low blood counts - This drug may decrease the number of white blood cells, red blood cells and platelets. You may be at increased risk for infections and bleeding. -signs of infection - fever or chills, cough, sore throat, pain or difficulty passing urine -signs of decreased platelets or bleeding - bruising, pinpoint red spots on the skin, black, tarry stools, nosebleeds -signs of decreased red blood cells - unusually weak or tired, fainting spells, lightheadedness -breathing problems -chest pain, pressure -cough -diarrhea -jaw tightness -mouth sores -nausea and vomiting -pain, swelling, redness or irritation at the injection site -pain, tingling, numbness in the hands or feet -problems with balance, talking, walking -redness, blistering, peeling or loosening of the skin, including inside the mouth -trouble passing urine or change in the amount of urine Side effects that usually do not require medical attention (report to your doctor or health care professional if they continue or are bothersome): -changes in vision -constipation -hair loss -loss of appetite -metallic taste in the mouth or changes in taste -stomach pain This list may not describe all possible side effects. Call your doctor for medical advice about side effects. You may report side effects to FDA at 1-800-FDA-1088. Where should I keep my medicine? This drug is given in a hospital or clinic and will not be stored at home. NOTE: This sheet is a summary. It may not cover all possible information. If you have questions about this medicine, talk to your doctor, pharmacist, or  health care provider.  2014, Elsevier/Gold Standard. (2008-04-19 17:22:47) Fluorouracil, 5-FU injection What is this medicine? FLUOROURACIL, 5-FU (flure oh YOOR a sil) is a chemotherapy drug. It slows the growth of cancer cells. This medicine is used to treat many types of cancer like breast cancer, colon or rectal cancer, pancreatic cancer, and stomach cancer. This medicine may be used for other purposes; ask your health care provider or pharmacist if you have questions. COMMON BRAND NAME(S): Adrucil What should I tell my health care provider before I take this medicine?  They need to know if you have any of these conditions: -blood disorders -dihydropyrimidine dehydrogenase (DPD) deficiency -infection (especially a virus infection such as chickenpox, cold sores, or herpes) -kidney disease -liver disease -malnourished, poor nutrition -recent or ongoing radiation therapy -an unusual or allergic reaction to fluorouracil, other chemotherapy, other medicines, foods, dyes, or preservatives -pregnant or trying to get pregnant -breast-feeding How should I use this medicine? This drug is given as an infusion or injection into a vein. It is administered in a hospital or clinic by a specially trained health care professional. Talk to your pediatrician regarding the use of this medicine in children. Special care may be needed. Overdosage: If you think you have taken too much of this medicine contact a poison control center or emergency room at once. NOTE: This medicine is only for you. Do not share this medicine with others. What if I miss a dose? It is important not to miss your dose. Call your doctor or health care professional if you are unable to keep an appointment. What may interact with this medicine? -allopurinol -cimetidine -dapsone -digoxin -hydroxyurea -leucovorin -levamisole -medicines for seizures like ethotoin, fosphenytoin, phenytoin -medicines to increase blood counts like  filgrastim, pegfilgrastim, sargramostim -medicines that treat or prevent blood clots like warfarin, enoxaparin, and dalteparin -methotrexate -metronidazole -pyrimethamine -some other chemotherapy drugs like busulfan, cisplatin, estramustine, vinblastine -trimethoprim -trimetrexate -vaccines Talk to your doctor or health care professional before taking any of these medicines: -acetaminophen -aspirin -ibuprofen -ketoprofen -naproxen This list may not describe all possible interactions. Give your health care provider a list of all the medicines, herbs, non-prescription drugs, or dietary supplements you use. Also tell them if you smoke, drink alcohol, or use illegal drugs. Some items may interact with your medicine. What should I watch for while using this medicine? Visit your doctor for checks on your progress. This drug may make you feel generally unwell. This is not uncommon, as chemotherapy can affect healthy cells as well as cancer cells. Report any side effects. Continue your course of treatment even though you feel ill unless your doctor tells you to stop. In some cases, you may be given additional medicines to help with side effects. Follow all directions for their use. Call your doctor or health care professional for advice if you get a fever, chills or sore throat, or other symptoms of a cold or flu. Do not treat yourself. This drug decreases your body's ability to fight infections. Try to avoid being around people who are sick. This medicine may increase your risk to bruise or bleed. Call your doctor or health care professional if you notice any unusual bleeding. Be careful brushing and flossing your teeth or using a toothpick because you may get an infection or bleed more easily. If you have any dental work done, tell your dentist you are receiving this medicine. Avoid taking products that contain aspirin, acetaminophen, ibuprofen, naproxen, or ketoprofen unless instructed by your  doctor. These medicines may hide a fever. Do not become pregnant while taking this medicine. Women should inform their doctor if they wish to become pregnant or think they might be pregnant. There is a potential for serious side effects to an unborn child. Talk to your health care professional or pharmacist for more information. Do not breast-feed an infant while taking this medicine. Men should inform their doctor if they wish to father a child. This medicine may lower sperm counts. Do not treat diarrhea with over the counter products. Contact your doctor if you  have diarrhea that lasts more than 2 days or if it is severe and watery. This medicine can make you more sensitive to the sun. Keep out of the sun. If you cannot avoid being in the sun, wear protective clothing and use sunscreen. Do not use sun lamps or tanning beds/booths. What side effects may I notice from receiving this medicine? Side effects that you should report to your doctor or health care professional as soon as possible: -allergic reactions like skin rash, itching or hives, swelling of the face, lips, or tongue -low blood counts - this medicine may decrease the number of white blood cells, red blood cells and platelets. You may be at increased risk for infections and bleeding. -signs of infection - fever or chills, cough, sore throat, pain or difficulty passing urine -signs of decreased platelets or bleeding - bruising, pinpoint red spots on the skin, black, tarry stools, blood in the urine -signs of decreased red blood cells - unusually weak or tired, fainting spells, lightheadedness -breathing problems -changes in vision -chest pain -mouth sores -nausea and vomiting -pain, swelling, redness at site where injected -pain, tingling, numbness in the hands or feet -redness, swelling, or sores on hands or feet -stomach pain -unusual bleeding Side effects that usually do not require medical attention (report to your doctor or  health care professional if they continue or are bothersome): -changes in finger or toe nails -diarrhea -dry or itchy skin -hair loss -headache -loss of appetite -sensitivity of eyes to the light -stomach upset -unusually teary eyes This list may not describe all possible side effects. Call your doctor for medical advice about side effects. You may report side effects to FDA at 1-800-FDA-1088. Where should I keep my medicine? This drug is given in a hospital or clinic and will not be stored at home. NOTE: This sheet is a summary. It may not cover all possible information. If you have questions about this medicine, talk to your doctor, pharmacist, or health care provider.  2014, Elsevier/Gold Standard. (2008-01-27 13:53:16) Metoclopramide tablets What is this medicine? METOCLOPRAMIDE (met oh kloe PRA mide) is used to treat the symptoms of gastroesophageal reflux disease (GERD) like heartburn. It is also used to treat people with slow emptying of the stomach and intestinal tract. This medicine may be used for other purposes; ask your health care provider or pharmacist if you have questions. COMMON BRAND NAME(S): Reglan What should I tell my health care provider before I take this medicine? They need to know if you have any of these conditions: -breast cancer -depression -diabetes -heart failure -high blood pressure -kidney disease -liver disease -Parkinson's disease or a movement disorder -pheochromocytoma -seizures -stomach obstruction, bleeding, or perforation -an unusual or allergic reaction to metoclopramide, procainamide, sulfites, other medicines, foods, dyes, or preservatives -pregnant or trying to get pregnant -breast-feeding How should I use this medicine? Take this medicine by mouth with a glass of water. Follow the directions on the prescription label. Take this medicine on an empty stomach, about 30 minutes before eating. Take your doses at regular intervals. Do not  take your medicine more often than directed. Do not stop taking except on the advice of your doctor or health care professional. A special MedGuide will be given to you by the pharmacist with each prescription and refill. Be sure to read this information carefully each time. Talk to your pediatrician regarding the use of this medicine in children. Special care may be needed. Overdosage: If you think you have taken too  much of this medicine contact a poison control center or emergency room at once. NOTE: This medicine is only for you. Do not share this medicine with others. What if I miss a dose? If you miss a dose, take it as soon as you can. If it is almost time for your next dose, take only that dose. Do not take double or extra doses. What may interact with this medicine? -acetaminophen -cyclosporine -digoxin -medicines for blood pressure -medicines for diabetes, including insulin -medicines for hay fever and other allergies -medicines for depression, especially an Monoamine Oxidase Inhibitor (MAOI) -medicines for Parkinson's disease, like levodopa -medicines for sleep or for pain -tetracycline This list may not describe all possible interactions. Give your health care provider a list of all the medicines, herbs, non-prescription drugs, or dietary supplements you use. Also tell them if you smoke, drink alcohol, or use illegal drugs. Some items may interact with your medicine. What should I watch for while using this medicine? It may take a few weeks for your stomach condition to start to get better. However, do not take this medicine for longer than 12 weeks. The longer you take this medicine, and the more you take it, the greater your chances are of developing serious side effects. If you are an elderly patient, a female patient, or you have diabetes, you may be at an increased risk for side effects from this medicine. Contact your doctor immediately if you start having movements you cannot  control such as lip smacking, rapid movements of the tongue, involuntary or uncontrollable movements of the eyes, head, arms and legs, or muscle twitches and spasms. Patients and their families should watch out for worsening depression or thoughts of suicide. Also watch out for any sudden or severe changes in feelings such as feeling anxious, agitated, panicky, irritable, hostile, aggressive, impulsive, severely restless, overly excited and hyperactive, or not being able to sleep. If this happens, especially at the beginning of treatment or after a change in dose, call your doctor. Do not treat yourself for high fever. Ask your doctor or health care professional for advice. You may get drowsy or dizzy. Do not drive, use machinery, or do anything that needs mental alertness until you know how this drug affects you. Do not stand or sit up quickly, especially if you are an older patient. This reduces the risk of dizzy or fainting spells. Alcohol can make you more drowsy and dizzy. Avoid alcoholic drinks. What side effects may I notice from receiving this medicine? Side effects that you should report to your doctor or health care professional as soon as possible: -allergic reactions like skin rash, itching or hives, swelling of the face, lips, or tongue -abnormal production of milk in females -breast enlargement in both males and females -change in the way you walk -difficulty moving, speaking or swallowing -drooling, lip smacking, or rapid movements of the tongue -excessive sweating -fever -involuntary or uncontrollable movements of the eyes, head, arms and legs -irregular heartbeat or palpitations -muscle twitches and spasms -unusually weak or tired Side effects that usually do not require medical attention (report to your doctor or health care professional if they continue or are bothersome): -change in sex drive or performance -depressed mood -diarrhea -difficulty  sleeping -headache -menstrual changes -restless or nervous This list may not describe all possible side effects. Call your doctor for medical advice about side effects. You may report side effects to FDA at 1-800-FDA-1088. Where should I keep my medicine? Keep out of the  reach of children. Store at room temperature between 20 and 25 degrees C (68 and 77 degrees F). Protect from light. Keep container tightly closed. Throw away any unused medicine after the expiration date. NOTE: This sheet is a summary. It may not cover all possible information. If you have questions about this medicine, talk to your doctor, pharmacist, or health care provider.  2014, [employer]vier/Gold Standard. (2012-01-21 13:04:38) Prochlorperazine tablets What is this medicine? PROCHLORPERAZINE (proe klor PER a zeen) helps to control severe nausea and vomiting. This medicine is also used to treat schizophrenia. It can also help patients who experience anxiety that is not due to psychological illness. This medicine may be used for other purposes; ask your health care provider or pharmacist if you have questions. COMMON BRAND NAME(S): Compazine What should I tell my health care provider before I take this medicine? They need to know if you have any of these conditions: -blood disorders or disease -dementia -liver disease or jaundice -Parkinson's disease -uncontrollable movement disorder -an unusual or allergic reaction to prochlorperazine, other medicines, foods, dyes, or preservatives -pregnant or trying to get pregnant -breast-feeding How should I use this medicine? Take this medicine by mouth with a glass of water. Follow the directions on the prescription label. Take your doses at regular intervals. Do not take your medicine more often than directed. Do not stop taking this medicine suddenly. This can cause nausea, vomiting, and dizziness. Ask your doctor or health care professional for advice. Talk to your pediatrician  regarding the use of this medicine in children. Special care may be needed. While this drug may be prescribed for children as young as 2 years for selected conditions, precautions do apply. Overdosage: If you think you have taken too much of this medicine contact a poison control center or emergency room at once. NOTE: This medicine is only for you. Do not share this medicine with others. What if I miss a dose? If you miss a dose, take it as soon as you can. If it is almost time for your next dose, take only that dose. Do not take double or extra doses. What may interact with this medicine? Do not take this medicine with any of the following medications: -amoxapine -antidepressants like citalopram, escitalopram, fluoxetine, paroxetine, and sertraline -deferoxamine -dofetilide -maprotiline -tricyclic antidepressants like amitriptyline, clomipramine, imipramine, nortiptyline and others This medicine may also interact with the following medications: -lithium -medicines for pain -phenytoin -propranolol -warfarin This list may not describe all possible interactions. Give your health care provider a list of all the medicines, herbs, non-prescription drugs, or dietary supplements you use. Also tell them if you smoke, drink alcohol, or use illegal drugs. Some items may interact with your medicine. What should I watch for while using this medicine? Visit your doctor or health care professional for regular checks on your progress. You may get drowsy or dizzy. Do not drive, use machinery, or do anything that needs mental alertness until you know how this medicine affects you. Do not stand or sit up quickly, especially if you are an older patient. This reduces the risk of dizzy or fainting spells. Alcohol may interfere with the effect of this medicine. Avoid alcoholic drinks. This medicine can reduce the response of your body to heat or cold. Dress warm in cold weather and stay hydrated in hot weather. If  possible, avoid extreme temperatures like saunas, hot tubs, very hot or cold showers, or activities that can cause dehydration such as vigorous exercise. This medicine can make  you more sensitive to the sun. Keep out of the sun. If you cannot avoid being in the sun, wear protective clothing and use sunscreen. Do not use sun lamps or tanning beds/booths. Your mouth may get dry. Chewing sugarless gum or sucking hard candy, and drinking plenty of water may help. Contact your doctor if the problem does not go away or is severe. What side effects may I notice from receiving this medicine? Side effects that you should report to your doctor or health care professional as soon as possible: -blurred vision -breast enlargement in men or women -breast milk in women who are not breast-feeding -chest pain, fast or irregular heartbeat -confusion, restlessness -dark yellow or brown urine -difficulty breathing or swallowing -dizziness or fainting spells -drooling, shaking, movement difficulty (shuffling walk) or rigidity -fever, chills, sore throat -involuntary or uncontrollable movements of the eyes, mouth, head, arms, and legs -seizures -stomach area pain -unusually weak or tired -unusual bleeding or bruising -yellowing of skin or eyes Side effects that usually do not require medical attention (report to your doctor or health care professional if they continue or are bothersome): -difficulty passing urine -difficulty sleeping -headache -sexual dysfunction -skin rash, or itching This list may not describe all possible side effects. Call your doctor for medical advice about side effects. You may report side effects to FDA at 1-800-FDA-1088. Where should I keep my medicine? Keep out of the reach of children. Store at room temperature between 15 and 30 degrees C (59 and 86 degrees F). Protect from light. Throw away any unused medicine after the expiration date. NOTE: This sheet is a summary. It may not  cover all possible information. If you have questions about this medicine, talk to your doctor, pharmacist, or health care provider.  2014, [employer]/Gold Standard. (2012-02-11 16:59:39) Ondansetron tablets What is this medicine? ONDANSETRON (on DAN se tron) is used to treat nausea and vomiting caused by chemotherapy. It is also used to prevent or treat nausea and vomiting after surgery. This medicine may be used for other purposes; ask your health care provider or pharmacist if you have questions. COMMON BRAND NAME(S): Zofran What should I tell my health care provider before I take this medicine? They need to know if you have any of these conditions: -heart disease -history of irregular heartbeat -liver disease -low levels of magnesium or potassium in the blood -an unusual or allergic reaction to ondansetron, granisetron, other medicines, foods, dyes, or preservatives -pregnant or trying to get pregnant -breast-feeding How should I use this medicine? Take this medicine by mouth with a glass of water. Follow the directions on your prescription label. Take your doses at regular intervals. Do not take your medicine more often than directed. Talk to your pediatrician regarding the use of this medicine in children. Special care may be needed. Overdosage: If you think you have taken too much of this medicine contact a poison control center or emergency room at once. NOTE: This medicine is only for you. Do not share this medicine with others. What if I miss a dose? If you miss a dose, take it as soon as you can. If it is almost time for your next dose, take only that dose. Do not take double or extra doses. What may interact with this medicine? Do not take this medicine with any of the following medications: -apomorphine -cisapride -dofetilide -dronedarone -pimozide -thioridazine -ziprasidone  This medicine may also interact with the following  medications: -carbamazepine -phenytoin -rifampicin -tramadol -other medicines that prolong the  QT interval (cause an abnormal heart rhythm) This list may not describe all possible interactions. Give your health care provider a list of all the medicines, herbs, non-prescription drugs, or dietary supplements you use. Also tell them if you smoke, drink alcohol, or use illegal drugs. Some items may interact with your medicine. What should I watch for while using this medicine? Check with your doctor or health care professional right away if you have any sign of an allergic reaction. What side effects may I notice from receiving this medicine? Side effects that you should report to your doctor or health care professional as soon as possible: -allergic reactions like skin rash, itching or hives, swelling of the face, lips or tongue -breathing problems -dizziness -fast or irregular heartbeat -feeling faint or lightheaded, falls -fever and chills -swelling of the hands or feet -tightness in the chest Side effects that usually do not require medical attention (report to your doctor or health care professional if they continue or are bothersome): -constipation or diarrhea -headache This list may not describe all possible side effects. Call your doctor for medical advice about side effects. You may report side effects to FDA at 1-800-FDA-1088. Where should I keep my medicine? Keep out of the reach of children. Store between 2 and 30 degrees C (36 and 86 degrees F). Throw away any unused medicine after the expiration date. NOTE: This sheet is a summary. It may not cover all possible information. If you have questions about this medicine, talk to your doctor, pharmacist, or health care provider.  2014, Elsevier/Gold Standard. (2012-09-24 16:05:54)

## 2013-09-03 ENCOUNTER — Other Ambulatory Visit: Payer: Self-pay

## 2013-09-03 ENCOUNTER — Encounter (HOSPITAL_COMMUNITY): Payer: Self-pay | Admitting: Emergency Medicine

## 2013-09-03 ENCOUNTER — Emergency Department (HOSPITAL_COMMUNITY)
Admission: EM | Admit: 2013-09-03 | Discharge: 2013-09-03 | Disposition: A | Discharge status: Home / Self Care | Payer: Medicare Other | Attending: Emergency Medicine | Admitting: Emergency Medicine

## 2013-09-03 DIAGNOSIS — Z8639 Personal history of other endocrine, nutritional and metabolic disease: Secondary | ICD-10-CM | POA: Insufficient documentation

## 2013-09-03 DIAGNOSIS — C169 Malignant neoplasm of stomach, unspecified: Secondary | ICD-10-CM | POA: Insufficient documentation

## 2013-09-03 DIAGNOSIS — F329 Major depressive disorder, single episode, unspecified: Secondary | ICD-10-CM | POA: Insufficient documentation

## 2013-09-03 DIAGNOSIS — I10 Essential (primary) hypertension: Secondary | ICD-10-CM | POA: Insufficient documentation

## 2013-09-03 DIAGNOSIS — E7800 Pure hypercholesterolemia, unspecified: Secondary | ICD-10-CM | POA: Insufficient documentation

## 2013-09-03 DIAGNOSIS — F3289 Other specified depressive episodes: Secondary | ICD-10-CM | POA: Insufficient documentation

## 2013-09-03 DIAGNOSIS — Z8619 Personal history of other infectious and parasitic diseases: Secondary | ICD-10-CM | POA: Insufficient documentation

## 2013-09-03 DIAGNOSIS — I82409 Acute embolism and thrombosis of unspecified deep veins of unspecified lower extremity: Secondary | ICD-10-CM | POA: Insufficient documentation

## 2013-09-03 DIAGNOSIS — M79609 Pain in unspecified limb: Secondary | ICD-10-CM | POA: Insufficient documentation

## 2013-09-03 DIAGNOSIS — Z8711 Personal history of peptic ulcer disease: Secondary | ICD-10-CM | POA: Insufficient documentation

## 2013-09-03 DIAGNOSIS — K219 Gastro-esophageal reflux disease without esophagitis: Secondary | ICD-10-CM | POA: Insufficient documentation

## 2013-09-03 DIAGNOSIS — R0600 Dyspnea, unspecified: Secondary | ICD-10-CM

## 2013-09-03 DIAGNOSIS — Z9889 Other specified postprocedural states: Secondary | ICD-10-CM | POA: Insufficient documentation

## 2013-09-03 DIAGNOSIS — Z87891 Personal history of nicotine dependence: Secondary | ICD-10-CM | POA: Insufficient documentation

## 2013-09-03 DIAGNOSIS — I2699 Other pulmonary embolism without acute cor pulmonale: Secondary | ICD-10-CM | POA: Insufficient documentation

## 2013-09-03 DIAGNOSIS — M129 Arthropathy, unspecified: Secondary | ICD-10-CM | POA: Insufficient documentation

## 2013-09-03 DIAGNOSIS — R0989 Other specified symptoms and signs involving the circulatory and respiratory systems: Secondary | ICD-10-CM | POA: Insufficient documentation

## 2013-09-03 DIAGNOSIS — R0609 Other forms of dyspnea: Secondary | ICD-10-CM | POA: Insufficient documentation

## 2013-09-03 DIAGNOSIS — M79605 Pain in left leg: Secondary | ICD-10-CM

## 2013-09-03 DIAGNOSIS — Z862 Personal history of diseases of the blood and blood-forming organs and certain disorders involving the immune mechanism: Secondary | ICD-10-CM | POA: Insufficient documentation

## 2013-09-03 DIAGNOSIS — Z79899 Other long term (current) drug therapy: Secondary | ICD-10-CM | POA: Insufficient documentation

## 2013-09-03 LAB — COMPREHENSIVE METABOLIC PANEL
AST: 80 U/L — ABNORMAL HIGH (ref 0–37)
Albumin: 3.8 g/dL (ref 3.5–5.2)
Alkaline Phosphatase: 81 U/L (ref 39–117)
BUN: 19 mg/dL (ref 6–23)
Calcium: 10.8 mg/dL — ABNORMAL HIGH (ref 8.4–10.5)
Chloride: 96 mEq/L (ref 96–112)
Creatinine, Ser: 0.81 mg/dL (ref 0.50–1.10)
Total Bilirubin: 0.5 mg/dL (ref 0.3–1.2)
Total Protein: 7.4 g/dL (ref 6.0–8.3)

## 2013-09-03 LAB — URINE MICROSCOPIC-ADD ON

## 2013-09-03 LAB — CBC
HCT: 37.5 % (ref 36.0–46.0)
MCH: 25.6 pg — ABNORMAL LOW (ref 26.0–34.0)
MCHC: 32 g/dL (ref 30.0–36.0)
MCV: 80.1 fL (ref 78.0–100.0)
Platelets: 343 10*3/uL (ref 150–400)
RBC: 4.68 MIL/uL (ref 3.87–5.11)
RDW: 21.1 % — ABNORMAL HIGH (ref 11.5–15.5)

## 2013-09-03 LAB — URINALYSIS, ROUTINE W REFLEX MICROSCOPIC
Bilirubin Urine: NEGATIVE
Ketones, ur: NEGATIVE mg/dL
Nitrite: NEGATIVE
Protein, ur: NEGATIVE mg/dL
Urobilinogen, UA: 0.2 mg/dL (ref 0.0–1.0)
pH: 6 (ref 5.0–8.0)

## 2013-09-03 LAB — TROPONIN I: Troponin I: <0.3 ng/mL (ref <0.30)

## 2013-09-03 MED ORDER — ONDANSETRON HCL 4 MG/2ML IJ SOLN
4.0000 mg | Freq: Once | INTRAMUSCULAR | Status: AC
  Administered 2013-09-03: 4 mg via INTRAVENOUS
  Filled 2013-09-03: qty 2

## 2013-09-03 MED ORDER — FENTANYL CITRATE 0.05 MG/ML IJ SOLN
50.0000 ug | INTRAMUSCULAR | Status: DC | PRN
  Administered 2013-09-03: 50 ug via INTRAVENOUS
  Filled 2013-09-03: qty 2

## 2013-09-03 NOTE — ED Notes (Signed)
Pt resting quietly.  No distress noted.  

## 2013-09-03 NOTE — ED Provider Notes (Signed)
CSN: [id number]     Arrival date & time 09/03/13  0239 History   First MD Initiated Contact with Patient 09/03/13 0248     Chief Complaint  Patient presents with  . Leg Pain  . Shortness of Breath   (Consider location/radiation/quality/duration/timing/severity/associated sxs/prior Treatment) HPI History provided by patient and her son bedside. Diagnosed with stage IV gastric cancer, presented with gastric bleeding. She is currently in chemotherapy. Recently diagnosed with DVT/PE and is on Lovenox at home. Patient has had multiple ED visits for leg pain and dyspnea. Tonight she complains of persistent pain and dyspnea. No chest pain. No fevers. No trauma. Her son bedside expresses concern about ongoing depression and concern about her taking an increasing number of pain pills at home. Patient states that she would like to be admitted to the hospital so that she can rest. Her son relates that she came in with the same presentation and complaints and requests earlier in the week and was discharged home. She has home health. Patient denies any suicidal ideations. She has had a poor appetite. She denies any new complaints. Symptoms moderate in severity. Past Medical History  Diagnosis Date  . Hypertension   . Paroxysmal SVT (supraventricular tachycardia)   . Hypercholesteremia   . GERD (gastroesophageal reflux disease)   . PUD (peptic ulcer disease)   . DVT (deep venous thrombosis) 2007  . Adrenal adenoma     left  . Thyroid nodule     left  . H. pylori infection   . Diverticulosis   . 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 Diarrhea     recurrent  . Arthritis   . Hiatal hernia   . Cancer     adenocarcinoma   Past Surgical History  Procedure Laterality Date  . Total knee arthroplasty  bilateral knee  . Hip  replacement  left  . Appendectomy    . Cholecystectomy    . Abdominal hysterectomy    . Ablation      of SVT  . Colonoscopy  09/13/2009     NWG:[id number] internal hemorrhoids  . Esophagogastroduodenoscopy  09/13/2009    [facility]:[id number]/  . Esophagogastroduodenoscopy N/A 06/11/2013    Procedure: ESOPHAGOGASTRODUODENOSCOPY (EGD);  Surgeon: West Bali, MD;  Location: [facility];  Service: Endoscopy;  Laterality: N/A;  2:15-moved to 1045 [name] notified pt  . Portacath placement Left 07/06/2013    Procedure: INSERTION PORT-A-CATH;  Surgeon: Almond Lint, MD;  Location: [facility];  Service: General;  Laterality: Left;  . Laparotomy N/A 07/07/2013    Procedure: EXPLORATORY LAPAROTOMY;  Surgeon: Dalia Heading, MD;  Location: [facility];  Service: General;  Laterality: N/A;  . Gastrorrhaphy N/A 07/07/2013    Procedure: Ferrel Logan;  Surgeon: Dalia Heading, MD;  Location: [facility];  Service: General;  Laterality: N/A;  . Breast lumpectomy      left  . Cardiac catheterization  2009    normal coronary arteries  . Joint replacement     Family History  Problem Relation Age of Onset  . Colon cancer Neg Hx   . Cancer Mother     leukemia  . Cancer Sister     breast  . Heart disease Brother   . Heart disease Sister    History  Substance Use Topics  . Smoking status: Former Smoker -- 15 years  . Smokeless tobacco: Never Used     Comment: Quit smoking x 3 weeks  . Alcohol Use:  No   OB History   Grav Para Term Preterm Abortions TAB SAB Ect Mult Living                 Review of Systems  Constitutional: Negative for fever and chills.  Eyes: Negative for pain.  Respiratory: Positive for shortness of breath. Negative for cough.   Cardiovascular: Negative for chest pain.  Gastrointestinal: Negative for vomiting and abdominal pain.  Genitourinary: Negative for dysuria.  Musculoskeletal: Negative for back pain, neck pain and neck stiffness.  Skin: Negative for rash.  Neurological: Negative for headaches.  All other systems reviewed and are negative.    Allergies  Review of patient's allergies indicates no known  allergies.  Home Medications   Current Outpatient Rx  Name  Route  Sig  Dispense  Refill  . dicyclomine (BENTYL) 20 MG tablet   Oral   Take 20 mg by mouth 2 (two) times daily at 8 am and 10 pm.         . diltiazem (TIAZAC) 360 MG 24 hr capsule   Oral   Take 360 mg by mouth every morning.          [name] Calcium (STOOL SOFTENER PO)   Oral   Take 1 capsule by mouth 2 (two) times daily.         Marland Kitchen enoxaparin (LOVENOX) 120 MG/0.8ML injection   Subcutaneous   Inject 0.8 mLs (120 mg total) into the skin daily. Each evening at same time 9 pm.   30 Syringe   0   . escitalopram (LEXAPRO) 10 MG tablet   Oral   Take 1 tablet (10 mg total) by mouth daily.   30 tablet   6   . fish oil-omega-3 fatty acids 1000 MG capsule   Oral   Take 1 g by mouth daily.         . fluconazole (DIFLUCAN) 100 MG tablet   Oral   Take 1 tablet (100 mg total) by mouth daily.   14 tablet   0   . lidocaine-prilocaine (EMLA) cream   Topical   Apply 1 application topically as needed (for port access).          . Linaclotide (LINZESS) 145 MCG CAPS capsule   Oral   Take 145 mcg by mouth daily.         Marland Kitchen LORazepam (ATIVAN) 0.5 MG tablet   Oral   Take 1 tablet (0.5 mg total) by mouth every 6 (six) hours as needed (nausea/vomiting).   30 tablet   0   . lovastatin (MEVACOR) 40 MG tablet   Oral   Take 2 tablets (80 mg total) by mouth at bedtime.   60 tablet   11   . magnesium hydroxide (MILK OF MAGNESIA) 400 MG/5ML suspension   Oral   Take 30 mLs by mouth daily as needed for constipation.         . metoCLOPramide (REGLAN) 10 MG tablet   Oral   Take 1 tablet (10 mg total) by mouth 4 (four) times daily.   100 tablet   3   . metoprolol succinate (TOPROL-XL) 25 MG 24 hr tablet   Oral   Take 3 tablets (75 mg total) by mouth daily. Take with or immediately following a meal.   30 tablet   0   . ondansetron (ZOFRAN) 8 MG tablet   Oral   Take 1 tablet (8 mg total) by mouth  every 8 (eight) hours as needed  for nausea or vomiting.   20 tablet   0   . oxyCODONE (OXY IR/ROXICODONE) 5 MG immediate release tablet   Oral   Take 1 tablet (5 mg total) by mouth every 4 (four) hours as needed for pain.   60 tablet   0   . pantoprazole (PROTONIX) 40 MG tablet   Oral   Take 1 tablet (40 mg total) by mouth 2 (two) times daily.   60 tablet   2   . Probiotic Product (PROBIOTIC FORMULA PO)   Oral   Take 1 tablet by mouth every morning.         . prochlorperazine (COMPAZINE) 10 MG tablet   Oral   Take 1 tablet (10 mg total) by mouth every 6 (six) hours.   100 tablet   3   . temazepam (RESTORIL) 7.5 MG capsule   Oral   Take 7.5 mg by mouth at bedtime as needed for sleep.          BP 136/78  Pulse 73  Temp(Src) 97.9 F (36.6 C) (Oral)  Resp 18  Wt 186 lb (84.369 kg)  SpO2 95% Physical Exam  Constitutional: She is oriented to person, place, and time. She appears well-developed and well-nourished.  HENT:  Head: Normocephalic and atraumatic.  Eyes: EOM are normal. Pupils are equal, round, and reactive to light.  Neck: Neck supple.  Cardiovascular: Normal rate, regular rhythm and intact distal pulses.   Pulmonary/Chest: Effort normal and breath sounds normal. No respiratory distress. She has no wheezes. She has no rales. She exhibits no tenderness.  Abdominal: Soft. She exhibits no distension. There is no tenderness.  Musculoskeletal: Normal range of motion. She exhibits no edema.  No calf tenderness. Localizes discomfort to left thigh with minimal tenderness. No erythema or cords. No bony deformity. Distal neurovascular intact x4. No significant lower extremity edema.  Neurological: She is alert and oriented to person, place, and time.  Skin: Skin is warm and dry.    ED Course  Procedures (including critical care time) Labs Review Labs Reviewed  CBC - Abnormal; Notable for the following:    MCH 25.6 (*)    RDW 21.1 (*)    All other components  within normal limits  URINALYSIS, ROUTINE W REFLEX MICROSCOPIC  COMPREHENSIVE METABOLIC PANEL  TROPONIN I    Date: 09/03/2013  Rate: 64  Rhythm: normal sinus rhythm  QRS Axis: normal  Intervals: normal  ST/T Wave abnormalities: nonspecific ST/T changes  Conduction Disutrbances:none  Narrative Interpretation:   Old EKG Reviewed: Previous EKG sinus tach with similar T-wave inversions V1 V2  IV fluids provided. IV fentanyl for pain. I long discussion with patient regarding her diagnosis of symptoms. She does have symptoms of depression and has recently been started on antidepressants by her primary care doctor who is following her for this. I recommended that she talk with palliative and hospice care given she has metastatic disease. There is no indication for admission at this time.  Patient discharged home with plan close primary care followup, continue medications as prescribed. MDM  Diagnoses: Persistent leg pain and dyspnea with history of pulmonary embolism and DVT secondary to gastric cancer  EKG and labs reviewed as above. Previous records reviewed had achieved recent chest x-ray. No indication for repeat imaging. Patient has a known PE. No hypoxia or respiratory distress or hypotension. No indication for CT angio chest at this time. She is taking Lovenox as prescribed  IV fluids and IV narcotics Vital signs  and nursing notes reviewed and considered  Sunnie Nielsen, MD 09/03/13 [phone]

## 2013-09-03 NOTE — ED Notes (Signed)
Pt reporting pain in left leg.  Also reporting weakness and SOB with exertion.  Pt was seen Monday night for same.

## 2013-09-05 ENCOUNTER — Emergency Department (HOSPITAL_COMMUNITY): Payer: Medicare Other

## 2013-09-05 ENCOUNTER — Encounter (HOSPITAL_COMMUNITY): Payer: Self-pay | Admitting: Emergency Medicine

## 2013-09-05 ENCOUNTER — Inpatient Hospital Stay (HOSPITAL_COMMUNITY)
Admission: EM | Admit: 2013-09-05 | Discharge: 2013-10-07 | DRG: 683 | Disposition: E | Payer: Medicare Other | Attending: Internal Medicine | Admitting: Internal Medicine

## 2013-09-05 ENCOUNTER — Other Ambulatory Visit: Payer: Self-pay

## 2013-09-05 DIAGNOSIS — R739 Hyperglycemia, unspecified: Secondary | ICD-10-CM | POA: Diagnosis present

## 2013-09-05 DIAGNOSIS — Z8249 Family history of ischemic heart disease and other diseases of the circulatory system: Secondary | ICD-10-CM

## 2013-09-05 DIAGNOSIS — Z515 Encounter for palliative care: Secondary | ICD-10-CM

## 2013-09-05 DIAGNOSIS — C169 Malignant neoplasm of stomach, unspecified: Secondary | ICD-10-CM | POA: Diagnosis present

## 2013-09-05 DIAGNOSIS — R7309 Other abnormal glucose: Secondary | ICD-10-CM | POA: Diagnosis present

## 2013-09-05 DIAGNOSIS — E7800 Pure hypercholesterolemia, unspecified: Secondary | ICD-10-CM | POA: Diagnosis present

## 2013-09-05 DIAGNOSIS — Z96659 Presence of unspecified artificial knee joint: Secondary | ICD-10-CM

## 2013-09-05 DIAGNOSIS — Z86718 Personal history of other venous thrombosis and embolism: Secondary | ICD-10-CM

## 2013-09-05 DIAGNOSIS — D3500 Benign neoplasm of unspecified adrenal gland: Secondary | ICD-10-CM | POA: Diagnosis present

## 2013-09-05 DIAGNOSIS — Z806 Family history of leukemia: Secondary | ICD-10-CM

## 2013-09-05 DIAGNOSIS — G8929 Other chronic pain: Secondary | ICD-10-CM | POA: Diagnosis present

## 2013-09-05 DIAGNOSIS — K219 Gastro-esophageal reflux disease without esophagitis: Secondary | ICD-10-CM | POA: Diagnosis present

## 2013-09-05 DIAGNOSIS — N179 Acute kidney failure, unspecified: Principal | ICD-10-CM | POA: Diagnosis present

## 2013-09-05 DIAGNOSIS — Z96649 Presence of unspecified artificial hip joint: Secondary | ICD-10-CM

## 2013-09-05 DIAGNOSIS — I959 Hypotension, unspecified: Secondary | ICD-10-CM | POA: Diagnosis present

## 2013-09-05 DIAGNOSIS — Z79899 Other long term (current) drug therapy: Secondary | ICD-10-CM

## 2013-09-05 DIAGNOSIS — R1310 Dysphagia, unspecified: Secondary | ICD-10-CM | POA: Diagnosis present

## 2013-09-05 DIAGNOSIS — Z86711 Personal history of pulmonary embolism: Secondary | ICD-10-CM

## 2013-09-05 DIAGNOSIS — E785 Hyperlipidemia, unspecified: Secondary | ICD-10-CM | POA: Diagnosis present

## 2013-09-05 DIAGNOSIS — I10 Essential (primary) hypertension: Secondary | ICD-10-CM | POA: Diagnosis present

## 2013-09-05 DIAGNOSIS — Z66 Do not resuscitate: Secondary | ICD-10-CM | POA: Diagnosis present

## 2013-09-05 DIAGNOSIS — R55 Syncope and collapse: Secondary | ICD-10-CM

## 2013-09-05 DIAGNOSIS — I2699 Other pulmonary embolism without acute cor pulmonale: Secondary | ICD-10-CM

## 2013-09-05 DIAGNOSIS — D649 Anemia, unspecified: Secondary | ICD-10-CM | POA: Diagnosis present

## 2013-09-05 DIAGNOSIS — E860 Dehydration: Secondary | ICD-10-CM

## 2013-09-05 DIAGNOSIS — M129 Arthropathy, unspecified: Secondary | ICD-10-CM | POA: Diagnosis present

## 2013-09-05 DIAGNOSIS — D72829 Elevated white blood cell count, unspecified: Secondary | ICD-10-CM | POA: Diagnosis present

## 2013-09-05 DIAGNOSIS — Z87891 Personal history of nicotine dependence: Secondary | ICD-10-CM

## 2013-09-05 DIAGNOSIS — C801 Malignant (primary) neoplasm, unspecified: Secondary | ICD-10-CM | POA: Diagnosis present

## 2013-09-05 HISTORY — DX: Malignant neoplasm of stomach, unspecified: C16.9

## 2013-09-05 HISTORY — DX: Pain in leg, unspecified: M79.606

## 2013-09-05 HISTORY — DX: Other pulmonary embolism without acute cor pulmonale: I26.99

## 2013-09-05 HISTORY — DX: Secondary malignant neoplasm of unspecified site: C79.9

## 2013-09-05 HISTORY — DX: Other chronic pain: G89.29

## 2013-09-05 LAB — CBC WITH DIFFERENTIAL/PLATELET
Basophils Relative: 0 % (ref 0–1)
Eosinophils Absolute: 0 10*3/uL (ref 0.0–0.7)
Eosinophils Relative: 0 % (ref 0–5)
HCT: 29.9 % — ABNORMAL LOW (ref 36.0–46.0)
Hemoglobin: 9.7 g/dL — ABNORMAL LOW (ref 12.0–15.0)
Lymphs Abs: 1.6 10*3/uL (ref 0.7–4.0)
MCH: 26.4 pg (ref 26.0–34.0)
MCHC: 32.4 g/dL (ref 30.0–36.0)
MCV: 81.3 fL (ref 78.0–100.0)
Monocytes Absolute: 1.9 10*3/uL — ABNORMAL HIGH (ref 0.1–1.0)
Monocytes Relative: 7 % (ref 3–12)
Neutro Abs: 23.8 10*3/uL — ABNORMAL HIGH (ref 1.7–7.7)
Platelets: 241 10*3/uL (ref 150–400)
RBC: 3.68 MIL/uL — ABNORMAL LOW (ref 3.87–5.11)
WBC: 27.3 10*3/uL — ABNORMAL HIGH (ref 4.0–10.5)

## 2013-09-05 LAB — URINALYSIS W MICROSCOPIC + REFLEX CULTURE
Ketones, ur: NEGATIVE mg/dL
Leukocytes, UA: NEGATIVE
Nitrite: NEGATIVE
Protein, ur: NEGATIVE mg/dL
Urobilinogen, UA: 0.2 mg/dL (ref 0.0–1.0)
pH: 5.5 (ref 5.0–8.0)

## 2013-09-05 LAB — TROPONIN I: Troponin I: <0.3 ng/mL (ref <0.30)

## 2013-09-05 LAB — BASIC METABOLIC PANEL
CO2: 22 mEq/L (ref 19–32)
Calcium: 10.2 mg/dL (ref 8.4–10.5)
Chloride: 98 mEq/L (ref 96–112)
Creatinine, Ser: 1.6 mg/dL — ABNORMAL HIGH (ref 0.50–1.10)
Glucose, Bld: 279 mg/dL — ABNORMAL HIGH (ref 70–99)
Sodium: 135 mEq/L (ref 135–145)

## 2013-09-05 LAB — LACTIC ACID, PLASMA: Lactic Acid, Venous: 5.2 mmol/L — ABNORMAL HIGH (ref 0.5–2.2)

## 2013-09-05 MED ORDER — TEMAZEPAM 7.5 MG PO CAPS
7.5000 mg | ORAL_CAPSULE | Freq: Every evening | ORAL | Status: DC | PRN
  Administered 2013-09-05: 7.5 mg via ORAL
  Filled 2013-09-05: qty 1

## 2013-09-05 MED ORDER — ENOXAPARIN SODIUM 120 MG/0.8ML ~~LOC~~ SOLN
SUBCUTANEOUS | Status: AC
  Filled 2013-09-05: qty 0.8

## 2013-09-05 MED ORDER — LINACLOTIDE 145 MCG PO CAPS
145.0000 ug | ORAL_CAPSULE | Freq: Every day | ORAL | Status: DC
  Administered 2013-09-06: 145 ug via ORAL
  Filled 2013-09-05 (×4): qty 1

## 2013-09-05 MED ORDER — OMEGA-3 FATTY ACIDS 1000 MG PO CAPS: 1.0000 g | ORAL_CAPSULE | Freq: Every day | ORAL | Status: DC

## 2013-09-05 MED ORDER — ESCITALOPRAM OXALATE 10 MG PO TABS
10.0000 mg | ORAL_TABLET | Freq: Every day | ORAL | Status: DC
  Administered 2013-09-06: 10 mg via ORAL
  Filled 2013-09-05 (×2): qty 1

## 2013-09-05 MED ORDER — ACETAMINOPHEN 650 MG RE SUPP: 650.0000 mg | Freq: Four times a day (QID) | RECTAL | Status: DC | PRN

## 2013-09-05 MED ORDER — LIDOCAINE-PRILOCAINE 2.5-2.5 % EX CREA
1.0000 "application " | TOPICAL_CREAM | CUTANEOUS | Status: DC | PRN
  Filled 2013-09-05: qty 5

## 2013-09-05 MED ORDER — OXYCODONE HCL 5 MG PO TABS
5.0000 mg | ORAL_TABLET | ORAL | Status: DC | PRN
  Administered 2013-09-05 – 2013-09-06 (×4): 5 mg via ORAL
  Filled 2013-09-05 (×4): qty 1

## 2013-09-05 MED ORDER — SODIUM CHLORIDE 0.9 % IV BOLUS (SEPSIS): 250.0000 mL | Freq: Once | INTRAVENOUS | Status: DC

## 2013-09-05 MED ORDER — DILTIAZEM HCL ER BEADS 240 MG PO CP24
360.0000 mg | ORAL_CAPSULE | ORAL | Status: DC
  Administered 2013-09-06: 360 mg via ORAL
  Filled 2013-09-05 (×5): qty 1

## 2013-09-05 MED ORDER — ACETAMINOPHEN 325 MG PO TABS: 650.0000 mg | ORAL_TABLET | Freq: Four times a day (QID) | ORAL | Status: DC | PRN

## 2013-09-05 MED ORDER — OMEGA-3-ACID ETHYL ESTERS 1 G PO CAPS
1.0000 g | ORAL_CAPSULE | Freq: Two times a day (BID) | ORAL | Status: DC
  Administered 2013-09-05 – 2013-09-06 (×2): 1 g via ORAL
  Filled 2013-09-05 (×7): qty 1

## 2013-09-05 MED ORDER — DICYCLOMINE HCL 10 MG PO CAPS
20.0000 mg | ORAL_CAPSULE | Freq: Two times a day (BID) | ORAL | Status: DC
  Administered 2013-09-05 – 2013-09-06 (×2): 20 mg via ORAL
  Filled 2013-09-05 (×3): qty 2

## 2013-09-05 MED ORDER — ENOXAPARIN SODIUM 120 MG/0.8ML ~~LOC~~ SOLN
120.0000 mg | SUBCUTANEOUS | Status: DC
  Administered 2013-09-05: 120 mg via SUBCUTANEOUS
  Filled 2013-09-05: qty 0.8

## 2013-09-05 MED ORDER — ATORVASTATIN CALCIUM 20 MG PO TABS
20.0000 mg | ORAL_TABLET | Freq: Every day | ORAL | Status: DC
  Administered 2013-09-05: 20 mg via ORAL
  Filled 2013-09-05 (×3): qty 1

## 2013-09-05 MED ORDER — ONDANSETRON HCL 4 MG PO TABS: 8.0000 mg | ORAL_TABLET | Freq: Three times a day (TID) | ORAL | Status: DC | PRN

## 2013-09-05 MED ORDER — METOPROLOL SUCCINATE ER 50 MG PO TB24
75.0000 mg | ORAL_TABLET | Freq: Every day | ORAL | Status: DC
  Filled 2013-09-05 (×3): qty 1

## 2013-09-05 MED ORDER — MORPHINE SULFATE 2 MG/ML IJ SOLN
2.0000 mg | INTRAMUSCULAR | Status: DC | PRN
  Administered 2013-09-05: 2 mg via INTRAVENOUS
  Filled 2013-09-05: qty 1

## 2013-09-05 MED ORDER — HEPARIN SODIUM (PORCINE) 5000 UNIT/ML IJ SOLN: 5000.0000 [IU] | Freq: Three times a day (TID) | INTRAMUSCULAR | Status: DC

## 2013-09-05 MED ORDER — DOCUSATE SODIUM 100 MG PO CAPS
100.0000 mg | ORAL_CAPSULE | Freq: Every day | ORAL | Status: DC
  Administered 2013-09-06: 100 mg via ORAL
  Filled 2013-09-05 (×2): qty 1

## 2013-09-05 MED ORDER — SODIUM CHLORIDE 0.9 % IV SOLN: INTRAVENOUS | Status: AC

## 2013-09-05 MED ORDER — SIMVASTATIN 40 MG PO TABS: 40.0000 mg | ORAL_TABLET | Freq: Every day | ORAL | Status: DC

## 2013-09-05 MED ORDER — SODIUM CHLORIDE 0.9 % IV SOLN
INTRAVENOUS | Status: DC
  Administered 2013-09-05 – 2013-09-06 (×2): via INTRAVENOUS

## 2013-09-05 MED ORDER — ONDANSETRON HCL 4 MG/2ML IJ SOLN: 4.0000 mg | Freq: Four times a day (QID) | INTRAMUSCULAR | Status: DC | PRN

## 2013-09-05 MED ORDER — ONDANSETRON HCL 4 MG PO TABS: 4.0000 mg | ORAL_TABLET | Freq: Four times a day (QID) | ORAL | Status: DC | PRN

## 2013-09-05 MED ORDER — SODIUM CHLORIDE 0.9 % IV SOLN
INTRAVENOUS | Status: DC
  Administered 2013-09-05: 15:00:00 via INTRAVENOUS

## 2013-09-05 MED ORDER — FLUCONAZOLE 100 MG PO TABS
100.0000 mg | ORAL_TABLET | Freq: Every day | ORAL | Status: DC
  Administered 2013-09-05 – 2013-09-06 (×2): 100 mg via ORAL
  Filled 2013-09-05 (×2): qty 1

## 2013-09-05 MED ORDER — PANTOPRAZOLE SODIUM 40 MG PO TBEC
40.0000 mg | DELAYED_RELEASE_TABLET | Freq: Two times a day (BID) | ORAL | Status: DC
  Administered 2013-09-05 – 2013-09-06 (×2): 40 mg via ORAL
  Filled 2013-09-05 (×3): qty 1

## 2013-09-05 MED ORDER — METOCLOPRAMIDE HCL 10 MG PO TABS
10.0000 mg | ORAL_TABLET | Freq: Four times a day (QID) | ORAL | Status: DC
  Administered 2013-09-05 – 2013-09-06 (×4): 10 mg via ORAL
  Filled 2013-09-05 (×5): qty 1

## 2013-09-05 MED ORDER — SODIUM CHLORIDE 0.9 % IV BOLUS (SEPSIS)
250.0000 mL | INTRAVENOUS | Status: AC
  Administered 2013-09-05: 250 mL via INTRAVENOUS

## 2013-09-05 MED ORDER — LORAZEPAM 0.5 MG PO TABS
0.5000 mg | ORAL_TABLET | Freq: Four times a day (QID) | ORAL | Status: DC | PRN
  Administered 2013-09-05: 0.5 mg via ORAL
  Filled 2013-09-05: qty 1

## 2013-09-05 MED ORDER — DICYCLOMINE HCL 20 MG PO TABS
20.0000 mg | ORAL_TABLET | Freq: Two times a day (BID) | ORAL | Status: DC
  Filled 2013-09-05 (×2): qty 1

## 2013-09-05 MED ORDER — SODIUM CHLORIDE 0.9 % IV BOLUS (SEPSIS)
250.0000 mL | Freq: Once | INTRAVENOUS | Status: AC
  Administered 2013-09-05: 14:00:00 via INTRAVENOUS

## 2013-09-05 NOTE — ED Notes (Signed)
Family was helping pt to the bathroom this morning and had a syncopal episode.  Family lowered her to the floor.  When ems arrived pt was responsive.  B/p 84/70 with ems.  Only c/o now is chronic left leg pain. cbg-  Normal per ems.

## 2013-09-05 NOTE — H&P (Addendum)
[facility] History and Physical  [name] AVW:[id number] DOB: [birth year] DOA: 10-02-2013  Referring physician: Emergency Department PCP: Evlyn Courier, MD  Specialists:   Chief Complaint: Weakness  HPI: [name] is a 72 y.o. female  With a hx of DVT, htn, and metastatic stomach cancer who presents to the ED with worsening weakness. Family reports decreased PO intake. Within the past month, the pt was recently admitted for GI bleed as well as new PE and DVT. In the ED, the patient was noted to be clinically dehydrated. She was started on IVF and the hospitalst consulted for admission. On further questioning, pt admits to not eating well and feeling markedly weak. Also reports feeling mildly SOB. Family reports pt with difficulty swallowing pills and food.  Review of Systems:  Per above, the remainder of the 10pt ros reviewed and are neg  Past Medical History  Diagnosis Date  . Hypertension   . Paroxysmal SVT (supraventricular tachycardia)   . Hypercholesteremia   . GERD (gastroesophageal reflux disease)   . PUD (peptic ulcer disease)   . DVT (deep venous thrombosis) 2007, 08/2013    LLE  . Adrenal adenoma     left  . Thyroid nodule     left  . H. pylori infection   . Diverticulosis   . 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 Diarrhea     recurrent  . Arthritis   . Hiatal hernia   . Adenocarcinoma of stomach     stage IV, metastatic  . Metastatic cancer   . Pulmonary embolism, bilateral 08/2013  . Chronic leg pain    Past Surgical History  Procedure Laterality Date  . Total knee arthroplasty  bilateral knee  . Hip  replacement  left  . Appendectomy    . Cholecystectomy    . Abdominal hysterectomy    . Ablation      of SVT  . Colonoscopy  09/13/2009    WGN:[id number]/IONGEXBM internal hemorrhoids  . Esophagogastroduodenoscopy  09/13/2009    WUX:LKGMWN/  . Esophagogastroduodenoscopy N/A 06/11/2013    Procedure:  ESOPHAGOGASTRODUODENOSCOPY (EGD);  Surgeon: West Bali, MD;  Location: [facility];  Service: Endoscopy;  Laterality: N/A;  2:15-moved to 1045 [name] notified pt  . Portacath placement Left 07/06/2013    Procedure: INSERTION PORT-A-CATH;  Surgeon: Almond Lint, MD;  Location: [facility];  Service: General;  Laterality: Left;  . Laparotomy N/A 07/07/2013    Procedure: EXPLORATORY LAPAROTOMY;  Surgeon: Dalia Heading, MD;  Location: [facility];  Service: General;  Laterality: N/A;  . Gastrorrhaphy N/A 07/07/2013    Procedure: Ferrel Logan;  Surgeon: Dalia Heading, MD;  Location: [facility];  Service: General;  Laterality: N/A;  . Breast lumpectomy      left  . Cardiac catheterization  2009    normal coronary arteries  . Joint replacement    . Vena cava filter placement  08/21/2013   Social History:  reports that she has quit smoking. She has never used smokeless tobacco. She reports that she does not drink alcohol or use illicit drugs.  where does patient live--home, ALF, SNF? and with whom if at home?  Can patient participate in ADLs?  No Known Allergies  Family History  Problem Relation Age of Onset  . Colon cancer Neg Hx   . Cancer Mother     leukemia  . Cancer Sister     breast  . Heart disease Brother   .  Heart disease Sister     (be sure to complete)  Prior to Admission medications   Medication Sig Start Date End Date Taking? Authorizing Provider  dicyclomine (BENTYL) 20 MG tablet Take 20 mg by mouth 2 (two) times daily at 8 am and 10 pm.   Yes Historical Provider, MD  diltiazem (TIAZAC) 360 MG 24 hr capsule Take 360 mg by mouth every morning.  05/01/12  Yes Gaylord Shih, MD  Docusate Calcium (STOOL SOFTENER PO) Take 1 capsule by mouth 2 (two) times daily.   Yes Historical Provider, MD  enoxaparin (LOVENOX) 120 MG/0.8ML injection Inject 0.8 mLs (120 mg total) into the skin daily. Each evening at same time 9 pm. 08/23/13  Yes Standley Brooking, MD  escitalopram (LEXAPRO) 10 MG  tablet Take 1 tablet (10 mg total) by mouth daily. 08/30/13  Yes Alla German, MD  fish oil-omega-3 fatty acids 1000 MG capsule Take 1 g by mouth daily.   Yes Historical Provider, MD  fluconazole (DIFLUCAN) 100 MG tablet Take 1 tablet (100 mg total) by mouth daily. 08/10/13  Yes Jerald Kief, MD  lidocaine-prilocaine (EMLA) cream Apply 1 application topically as needed (for port access).    Yes Historical Provider, MD  Linaclotide Karlene Einstein) 145 MCG CAPS capsule Take 145 mcg by mouth daily.   Yes Historical Provider, MD  LORazepam (ATIVAN) 0.5 MG tablet Take 1 tablet (0.5 mg total) by mouth every 6 (six) hours as needed (nausea/vomiting). 08/10/13  Yes Jerald Kief, MD  lovastatin (MEVACOR) 40 MG tablet Take 2 tablets (80 mg total) by mouth at bedtime. 11/11/12  Yes Gaylord Shih, MD  metoCLOPramide (REGLAN) 10 MG tablet Take 1 tablet (10 mg total) by mouth 4 (four) times daily. 07/15/13  Yes Dalia Heading, MD  metoprolol succinate (TOPROL-XL) 25 MG 24 hr tablet Take 3 tablets (75 mg total) by mouth daily. Take with or immediately following a meal. 08/10/13  Yes Jerald Kief, MD  ondansetron (ZOFRAN) 8 MG tablet Take 1 tablet (8 mg total) by mouth every 8 (eight) hours as needed for nausea or vomiting. 08/10/13  Yes Jerald Kief, MD  oxyCODONE (OXY IR/ROXICODONE) 5 MG immediate release tablet Take 1 tablet (5 mg total) by mouth every 4 (four) hours as needed for pain. 07/27/13  Yes Maurine Minister Kefalas, PA-C  pantoprazole (PROTONIX) 40 MG tablet Take 1 tablet (40 mg total) by mouth 2 (two) times daily. 07/15/13  Yes Dalia Heading, MD  Probiotic Product (PROBIOTIC FORMULA PO) Take 1 tablet by mouth every morning.   Yes Historical Provider, MD  prochlorperazine (COMPAZINE) 10 MG tablet Take 1 tablet (10 mg total) by mouth every 6 (six) hours. 08/13/13  Yes Maurine Minister Kefalas, PA-C  temazepam (RESTORIL) 7.5 MG capsule Take 7.5 mg by mouth at bedtime as needed for sleep.   Yes Historical Provider, MD    Physical Exam: Filed Vitals:   08/11/2013 1216 08/14/2013 1401 08/28/2013 1402 08/30/2013 1403  BP: 91/59 100/61 106/62 143/95  Pulse:  82 86 96  Temp: 97.6 F (36.4 C)     TempSrc: Oral     Resp: 12     SpO2: 100%        General:  Awake, in nad  Eyes: PERRL B  ENT: membranes dry, dentition fair  Neck: trachea midline, neck supple  Cardiovascular: regular, s1, s2  Respiratory: normal resp effort, no wheezing  Abdomen: soft, nondistended  Skin: decreased skin turgor, no abnormal skin lesions  seen  Musculoskeletal: perfused, no clubbing   Psychiatric: mood/affect normal // no auditory/visual hallucinations  Neurologic: cn2-12 grossly intact, generally weak  Labs on Admission:  Basic Metabolic Panel:  Recent Labs Lab 08/30/13 0835 09/03/13 0325 08/25/2013 1404  NA 137 136 135  K 4.5 4.6 5.2*  CL 98 96 98  CO2 30 27 22   GLUCOSE 151* 154* 279*  BUN 13 19 33*  CREATININE 0.70 0.81 1.60*  CALCIUM 9.7 10.8* 10.2  MG 2.3  --   --    Liver Function Tests:  Recent Labs Lab 08/30/13 0835 09/03/13 0325  AST 89* 80*  ALT 383* 380*  ALKPHOS 79 81  BILITOT 0.3 0.5  PROT 6.9 7.4  ALBUMIN 3.4* 3.8   No results found for this basename: LIPASE, AMYLASE,  in the last 168 hours No results found for this basename: AMMONIA,  in the last 168 hours CBC:  Recent Labs Lab 08/30/13 0835 09/03/13 0325 09/01/2013 1404  WBC 11.6* 10.2 27.3*  NEUTROABS 9.8*  --  23.8*  HGB 11.9* 12.0 9.7*  HCT 37.0 37.5 29.9*  MCV 80.3 80.1 81.3  PLT 376 343 241   Cardiac Enzymes:  Recent Labs Lab 09/03/13 0325 08/27/2013 1404  TROPONINI <0.30 <0.30    BNP (last 3 results)  Recent Labs  08/18/13 2129  PROBNP 164.2*   CBG: No results found for this basename: GLUCAP,  in the last 168 hours  Radiological Exams on Admission: Dg Chest 2 View  08/19/2013   CLINICAL DATA:  Loss of consciousness.  Shortness of breath.  EXAM: CHEST  2 VIEW  COMPARISON:  08/30/2013  FINDINGS: The  heart size and mediastinal contours are within normal limits. Both lungs are clear. The visualized skeletal structures are unremarkable. Left subclavian Port-A-Cath terminates in the mid superior vena cava.  IMPRESSION: No active cardiopulmonary disease.   Electronically Signed   By: Britta Mccreedy M.D.   On: 08/23/2013 14:56     Assessment/Plan Principal Problem:   ARF (acute renal failure) Active Problems:   DYSLIPIDEMIA   HYPERTENSION   Adenocarcinoma of stomach   Acute renal failure   1. Acute renal failure 1. GFR from 82 -> 36 (43% drop in GFR) 2. Will admit to inpt, med floor 3. Cont on aggressive IVF 4. Likely related to poor PO intake 5. Monitor renal function closely 2. Dysphagia 1. Per family pt has been experiencing difficulty swallowing food and pills 2. Will consult SLP for eval 3. HTN 1. BP stable 2. Cont meds for now 4. HLD 1. Cont statin 5. Hx adenocarcinoma 1. Stable 6. Recent DVT with PE 1. Will continue on therapeutic Lovenox per home regimen  Code Status: DNR -  Confirmed with patient Family Communication: Pt in room Disposition Plan: Pending  Time spent:  Akeira Lahm, Scheryl Marten [facility] Pager [phone]  If 7PM-7AM, please contact night-coverage [email or web address] Password [facility] 09/02/2013, 3:37 PM

## 2013-09-05 NOTE — ED Notes (Signed)
Pt completed orthostatic vital signs, unable to stand long enough to check her bp. Assisted back to bed by staff.

## 2013-09-05 NOTE — ED Notes (Signed)
Discussed plan of care with pt's family, nodded in agreement, they stated they would like to have pt admitted then placed at [facility].

## 2013-09-05 NOTE — ED Notes (Signed)
Pt repositioned to left side per pt request.  Pt unable to lay on her side and requested that we turn her to her back. Dr.mcmanus to bedside and discussed plan to admit her. Pt verbalized understanding of all.

## 2013-09-05 NOTE — ED Notes (Signed)
Safety socks placed per family request.

## 2013-09-05 NOTE — ED Notes (Addendum)
Pt arrived by ems from home, the family reported that the pt a near syncopal episode while standing, pt did not fall, was caught by a family member. Pt alert at arrival and stated she "just don't feel good", also stated she was having left hip/thigh pain.  Will nod off when left alone, but arouses easily with verbal stimuli.  Denies any nausea or vomiting. No cp or sob. Denies any recent fevers.  She stated that she was getting chemo for stomach cancer, but has since stopped treatment to "get my levels up".   Iv started x1 to right forearm. Pt tolerated well. Pt placed on all monitors.

## 2013-09-05 NOTE — ED Notes (Signed)
Pt returned from xray, sips of water given to pt. She denies any complaints at present.

## 2013-09-05 NOTE — ED Provider Notes (Signed)
CSN: [id number]     Arrival date & time 08/30/2013  1210 History   First MD Initiated Contact with Patient 08/14/2013 1308     Chief Complaint  Patient presents with  . Loss of Consciousness    HPI Pt was seen at 1320.  Per pt and her family, c/o sudden onset and resolution of one episode of syncope that occurred today PTA. Pt's family states pt was sitting on the toilet when she "passed out." Family states pt's "eyes rolled back in her head" and "she was unresponsive." Pt's family lowered her to the floor and she woke spontaneously. Denies seizure activity, no incont of bowel/bladder, no confusion, no fall. EMS noted pt's BP to be "84/70" on their arrival to scene. Pt's family states pt has been c/o continued SOB, LLE pain, decreased PO intake, generalized fatigue and weakness since being discharged from the hospital on 08/23/13 for dx metastatic gastric cancer, GI bleeding, bilateral PE, and LLE DVT. Pt has had IVC filter placed and continues on lovenox SQ injections daily. Pt's family states pt will be restarting chemo on Tuesday (in 2 days). Family is unclear regarding what the goals of pt's care are. Family now states they feel they are unable to care for her at home due to her symptoms. State they had Home Health coming to the home, but their last day is Wednesday (in 3 days). Pt herself denies palpitations/CP, no abd pain, no N/V/D, no fevers, no rash, no back pain. The symptoms have been associated with no other complaints. The patient has a significant history of similar symptoms previously, recently being evaluated for this complaint and multiple prior evals for same. This is pt's 3rd ED visit this week for similar symptoms.      Past Medical History  Diagnosis Date  . Hypertension   . Paroxysmal SVT (supraventricular tachycardia)   . Hypercholesteremia   . GERD (gastroesophageal reflux disease)   . PUD (peptic ulcer disease)   . DVT (deep venous thrombosis) 2007, 08/2013    LLE  .  Adrenal adenoma     left  . Thyroid nodule     left  . H. pylori infection   . Diverticulosis   . 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 Diarrhea     recurrent  . Arthritis   . Hiatal hernia   . Adenocarcinoma of stomach     stage IV, metastatic  . Metastatic cancer   . Pulmonary embolism, bilateral 08/2013  . Chronic leg pain    Past Surgical History  Procedure Laterality Date  . Total knee arthroplasty  bilateral knee  . Hip  replacement  left  . Appendectomy    . Cholecystectomy    . Abdominal hysterectomy    . Ablation      of SVT  . Colonoscopy  09/13/2009    WUJ:[id number]/AOZHYQMV internal hemorrhoids  . Esophagogastroduodenoscopy  09/13/2009    HQI:ONGEXB/  . Esophagogastroduodenoscopy N/A 06/11/2013    Procedure: ESOPHAGOGASTRODUODENOSCOPY (EGD);  Surgeon: West Bali, MD;  Location: [facility];  Service: Endoscopy;  Laterality: N/A;  2:15-moved to 1045 [name] notified pt  . Portacath placement Left 07/06/2013    Procedure: INSERTION PORT-A-CATH;  Surgeon: Almond Lint, MD;  Location: [facility];  Service: General;  Laterality: Left;  . Laparotomy N/A 07/07/2013    Procedure: EXPLORATORY LAPAROTOMY;  Surgeon: Dalia Heading, MD;  Location: [facility];  Service: General;  Laterality: N/A;  . Ferrel Logan  N/A 07/07/2013    Procedure: Ferrel Logan;  Surgeon: Dalia Heading, MD;  Location: [facility];  Service: General;  Laterality: N/A;  . Breast lumpectomy      left  . Cardiac catheterization  2009    normal coronary arteries  . Joint replacement    . Vena cava filter placement  08/21/2013   Family History  Problem Relation Age of Onset  . Colon cancer Neg Hx   . Cancer Mother     leukemia  . Cancer Sister     breast  . Heart disease Brother   . Heart disease Sister    History  Substance Use Topics  . Smoking status: Former Smoker -- 15 years  . Smokeless tobacco: Never Used     Comment: Quit smoking x 3 weeks  . Alcohol  Use: No    Review of Systems ROS: Statement: All systems negative except as marked or noted in the HPI; Constitutional: Negative for fever and chills. +generalized weakness/fatigue.; ; Eyes: Negative for eye pain, redness and discharge. ; ; ENMT: Negative for ear pain, hoarseness, nasal congestion, sinus pressure and sore throat. ; ; Cardiovascular: Negative for chest pain, palpitations, diaphoresis, and peripheral edema. ; ; Respiratory: +dyspnea. Negative for cough, wheezing and stridor. ; ; Gastrointestinal: +decreased PO intake. Negative for nausea, vomiting, diarrhea, abdominal pain, blood in stool, hematemesis, jaundice and rectal bleeding. . ; ; Genitourinary: Negative for dysuria, flank pain and hematuria. ; ; Musculoskeletal: +chronic LLE pain. Negative for back pain and neck pain. Negative for swelling and trauma.; ; Skin: Negative for pruritus, rash, abrasions, blisters, bruising and skin lesion.; ; Neuro: +syncope, lightheadedness. Negative for headache and neck stiffness. Negative for altered mental status, extremity weakness, paresthesias, involuntary movement, seizure.     Allergies  Review of patient's allergies indicates no known allergies.  Home Medications   Current Outpatient Rx  Name  Route  Sig  Dispense  Refill  . dicyclomine (BENTYL) 20 MG tablet   Oral   Take 20 mg by mouth 2 (two) times daily at 8 am and 10 pm.         . diltiazem (TIAZAC) 360 MG 24 hr capsule   Oral   Take 360 mg by mouth every morning.          [name] Calcium (STOOL SOFTENER PO)   Oral   Take 1 capsule by mouth 2 (two) times daily.         Marland Kitchen enoxaparin (LOVENOX) 120 MG/0.8ML injection   Subcutaneous   Inject 0.8 mLs (120 mg total) into the skin daily. Each evening at same time 9 pm.   30 Syringe   0   . escitalopram (LEXAPRO) 10 MG tablet   Oral   Take 1 tablet (10 mg total) by mouth daily.   30 tablet   6   . fish oil-omega-3 fatty acids 1000 MG capsule   Oral   Take 1 g  by mouth daily.         . fluconazole (DIFLUCAN) 100 MG tablet   Oral   Take 1 tablet (100 mg total) by mouth daily.   14 tablet   0   . lidocaine-prilocaine (EMLA) cream   Topical   Apply 1 application topically as needed (for port access).          . Linaclotide (LINZESS) 145 MCG CAPS capsule   Oral   Take 145 mcg by mouth daily.         Marland Kitchen  LORazepam (ATIVAN) 0.5 MG tablet   Oral   Take 1 tablet (0.5 mg total) by mouth every 6 (six) hours as needed (nausea/vomiting).   30 tablet   0   . lovastatin (MEVACOR) 40 MG tablet   Oral   Take 2 tablets (80 mg total) by mouth at bedtime.   60 tablet   11   . metoCLOPramide (REGLAN) 10 MG tablet   Oral   Take 1 tablet (10 mg total) by mouth 4 (four) times daily.   100 tablet   3   . metoprolol succinate (TOPROL-XL) 25 MG 24 hr tablet   Oral   Take 3 tablets (75 mg total) by mouth daily. Take with or immediately following a meal.   30 tablet   0   . ondansetron (ZOFRAN) 8 MG tablet   Oral   Take 1 tablet (8 mg total) by mouth every 8 (eight) hours as needed for nausea or vomiting.   20 tablet   0   . oxyCODONE (OXY IR/ROXICODONE) 5 MG immediate release tablet   Oral   Take 1 tablet (5 mg total) by mouth every 4 (four) hours as needed for pain.   60 tablet   0   . pantoprazole (PROTONIX) 40 MG tablet   Oral   Take 1 tablet (40 mg total) by mouth 2 (two) times daily.   60 tablet   2   . Probiotic Product (PROBIOTIC FORMULA PO)   Oral   Take 1 tablet by mouth every morning.         . prochlorperazine (COMPAZINE) 10 MG tablet   Oral   Take 1 tablet (10 mg total) by mouth every 6 (six) hours.   100 tablet   3   . temazepam (RESTORIL) 7.5 MG capsule   Oral   Take 7.5 mg by mouth at bedtime as needed for sleep.          BP 100/61  Pulse 82  Temp(Src) 97.6 F (36.4 C) (Oral)  Resp 12  SpO2 100% Filed Vitals:   08/25/2013 1216 08/13/2013 1401 08/12/2013 1402 08/09/2013 1403  BP: 91/59 100/61 106/62  143/95  Pulse:  82 86 96  Temp: 97.6 F (36.4 C)     TempSrc: Oral     Resp: 12     SpO2: 100%       Physical Exam 1325: Physical examination:  Nursing notes reviewed; Vital signs and O2 SAT reviewed;  Constitutional: Well developed, Well nourished, In no acute distress; Head:  Normocephalic, atraumatic; Eyes: EOMI, PERRL, No scleral icterus; ENMT: Mouth and pharynx normal, Mucous membranes dry; Neck: Supple, Full range of motion, No lymphadenopathy; Cardiovascular: Regular rate and rhythm, No gallop; Respiratory: Breath sounds clear & equal bilaterally, No wheezes.  Speaking full sentences with ease, Normal respiratory effort/excursion; Chest: Nontender, Movement normal; Abdomen: Soft, Nontender, Nondistended, Normal bowel sounds; Genitourinary: No CVA tenderness; Extremities: Pulses normal, No tenderness, No edema, No calf edema or asymmetry.; Neuro: AA&Ox3, Major CN grossly intact. No facial droop. Speech clear. No gross focal motor or sensory deficits in extremities.; Skin: Color normal, Warm, Dry.; Psych:  Affect flat, poor eye contact. Entire HPI given to me with her eyes closed.     ED Course  Procedures      EKG Interpretation   None       MDM  MDM Reviewed: previous chart, nursing note and vitals Reviewed previous: labs, ECG, CT scan and ultrasound Interpretation: labs, ECG and x-ray  Date: 2013-09-26  Rate: 83  Rhythm: normal sinus rhythm  QRS Axis: normal  Intervals: normal  ST/T Wave abnormalities: nonspecific T wave changes anterior leads  Conduction Disutrbances:none  Narrative Interpretation:   Old EKG Reviewed: unchanged; no significant changes from previous EKG dated 09/03/2013.   Results for orders placed during the hospital encounter of 2013/09/26  URINALYSIS W MICROSCOPIC + REFLEX CULTURE      Result Value Range   Color, Urine YELLOW  YELLOW   APPearance HAZY (*) CLEAR   Specific Gravity, Urine 1.025  1.005 - 1.030   pH 5.5  5.0 - 8.0    Glucose, UA NEGATIVE  NEGATIVE mg/dL   Hgb urine dipstick NEGATIVE  NEGATIVE   Bilirubin Urine NEGATIVE  NEGATIVE   Ketones, ur NEGATIVE  NEGATIVE mg/dL   Protein, ur NEGATIVE  NEGATIVE mg/dL   Urobilinogen, UA 0.2  0.0 - 1.0 mg/dL   Nitrite NEGATIVE  NEGATIVE   Leukocytes, UA NEGATIVE  NEGATIVE   WBC, UA 0-2  <3 WBC/hpf   Bacteria, UA MANY (*) RARE   Squamous Epithelial / LPF FEW (*) RARE   Casts GRANULAR CAST (*) NEGATIVE  CBC WITH DIFFERENTIAL      Result Value Range   WBC 27.3 (*) 4.0 - 10.5 K/uL   RBC 3.68 (*) 3.87 - 5.11 MIL/uL   Hemoglobin 9.7 (*) 12.0 - 15.0 g/dL   HCT 16.1 (*) 09.6 - 04.5 %   MCV 81.3  78.0 - 100.0 fL   MCH 26.4  26.0 - 34.0 pg   MCHC 32.4  30.0 - 36.0 g/dL   RDW 40.9 (*) 81.1 - 91.4 %   Platelets 241  150 - 400 K/uL   Neutrophils Relative % 87 (*) 43 - 77 %   Lymphocytes Relative 6 (*) 12 - 46 %   Monocytes Relative 7  3 - 12 %   Eosinophils Relative 0  0 - 5 %   Basophils Relative 0  0 - 1 %   Neutro Abs 23.8 (*) 1.7 - 7.7 K/uL   Lymphs Abs 1.6  0.7 - 4.0 K/uL   Monocytes Absolute 1.9 (*) 0.1 - 1.0 K/uL   Eosinophils Absolute 0.0  0.0 - 0.7 K/uL   Basophils Absolute 0.0  0.0 - 0.1 K/uL   RBC Morphology POLYCHROMASIA PRESENT     WBC Morphology MILD LEFT SHIFT (1-5% METAS, OCC MYELO, OCC BANDS)    BASIC METABOLIC PANEL      Result Value Range   Sodium 135  135 - 145 mEq/L   Potassium 5.2 (*) 3.5 - 5.1 mEq/L   Chloride 98  96 - 112 mEq/L   CO2 22  19 - 32 mEq/L   Glucose, Bld 279 (*) 70 - 99 mg/dL   BUN 33 (*) 6 - 23 mg/dL   Creatinine, Ser 7.82 (*) 0.50 - 1.10 mg/dL   Calcium 95.6  8.4 - 21.3 mg/dL   GFR calc non Af Amer 31 (*) >90 mL/min   GFR calc Af Amer 36 (*) >90 mL/min  LACTIC ACID, PLASMA      Result Value Range   Lactic Acid, Venous 5.2 (*) 0.5 - 2.2 mmol/L  TROPONIN I      Result Value Range   Troponin I <0.30  <0.30 ng/mL   Dg Chest 2 View 2013-09-26   CLINICAL DATA:  Loss of consciousness.  Shortness of breath.  EXAM: CHEST   2 VIEW  COMPARISON:  08/30/2013  FINDINGS: The heart size  and mediastinal contours are within normal limits. Both lungs are clear. The visualized skeletal structures are unremarkable. Left subclavian Port-A-Cath terminates in the mid superior vena cava.  IMPRESSION: No active cardiopulmonary disease.   Electronically Signed   By: Britta Mccreedy M.D.   On: 2013/09/16 14:56   Ct Angio Chest Pe W/cm &/or Wo Cm 08/19/2013   CLINICAL DATA:  Shortness of breath. Undergoing chemotherapy for gastric carcinoma.  EXAM: CT ANGIOGRAPHY CHEST WITH CONTRAST  TECHNIQUE: Multidetector CT imaging of the chest was performed using the standard protocol during bolus administration of intravenous contrast. Multiplanar CT image reconstructions including MIPs were obtained to evaluate the vascular anatomy.  CONTRAST:  OMNIPAQUE IOHEXOL 350 MG/ML SOLN  COMPARISON:  Chest radiographs obtained earlier today. PET-CT dated 06/24/2013. Abdomen and pelvis CT dated 07/07/2013.  FINDINGS: Multiple bilateral pulmonary arterial filling defects. These include the upper and lower lobes bilaterally and the right middle lobe. No large central pulmonary arterial filling defects are seen. No lung nodules or enlarged lymph nodes. Thoracic spine degenerative changes. Left adrenal mass measuring 2.5 x 2.0 cm on image number 80 and partially included smaller right adrenal mass measuring 1.4 x 0.8 cm on image number 85. These have not changed significantly since 07/07/2013. Diffuse gastric wall thickening is again noted.  Review of the MIP images confirms the above findings.  IMPRESSION: 1. Multiple small to moderate-sized bilateral pulmonary emboli. 2. Stable bilateral adrenal adenomas. 3. Persistent gastric wall thickening compatible with the patient's known gastric carcinoma. Critical Value/emergent results were called by telephone at the time of interpretation on 08/19/2013 at 12:10 AM to Dr.STEPHEN RANCOUR , who verbally acknowledged these  results.   Electronically Signed   By: Gordan Payment M.D.   On: 08/19/2013 00:12    Results for GILBERTO, STRECK (MRN [id number]) as of 09/16/2013 15:11  Ref. Range 08/18/2013 21:29 08/19/2013 03:08 08/30/2013 08:35 09/03/2013 03:25 09/16/2013 14:04  BUN Latest Range: 6-23 mg/dL 19 18 13 19  33 (H)  Creatinine Latest Range: 0.50-1.10 mg/dL 4.09 8.11 9.14 7.82 9.56 (H)   Results for FALICIA, LIZOTTE (MRN [id number]) as of 2013-09-16 15:11  Ref. Range 08/20/2013 04:41 08/23/2013 04:52 08/30/2013 08:35 09/03/2013 03:25 2013/09/16 14:04  WBC Latest Range: 4.0-10.5 K/uL 4.0 4.8 11.6 (H) 10.2 27.3 (H)  Hemoglobin Latest Range: 12.0-15.0 g/dL 9.3 (L) 9.7 (L) 46.9 (L) 12.0 9.7 (L)  HCT Latest Range: 36.0-46.0 % 28.2 (L) 29.8 (L) 37.0 37.5 29.9 (L)    1520:   SBP 91 on arrival, now 106 after judicious IVF. New ARF on labs today; will continue IVF. Pt unable to stand for orthostatic VS, stating she was "too weak." Neuro exam continues intact and unchanged. Dx and testing d/w pt and family.  Questions answered.  Verb understanding, agreeable to admit. T/C to [facility]. Rhona Leavens, case discussed, including:  HPI, pertinent PM/SHx, VS/PE, dx testing, ED course and treatment:  Agreeable to admit, requests to write temporary orders, obtain medical inpatient bed to team 2.   Laray Anger, DO 09/06/13 1223

## 2013-09-06 ENCOUNTER — Inpatient Hospital Stay (HOSPITAL_COMMUNITY): Payer: Medicare Other

## 2013-09-06 DIAGNOSIS — D649 Anemia, unspecified: Secondary | ICD-10-CM | POA: Diagnosis present

## 2013-09-06 DIAGNOSIS — C169 Malignant neoplasm of stomach, unspecified: Secondary | ICD-10-CM

## 2013-09-06 DIAGNOSIS — D72829 Elevated white blood cell count, unspecified: Secondary | ICD-10-CM | POA: Diagnosis present

## 2013-09-06 DIAGNOSIS — R739 Hyperglycemia, unspecified: Secondary | ICD-10-CM | POA: Diagnosis present

## 2013-09-06 LAB — URINALYSIS, ROUTINE W REFLEX MICROSCOPIC
Bilirubin Urine: NEGATIVE
Glucose, UA: NEGATIVE mg/dL
Hgb urine dipstick: NEGATIVE
Protein, ur: NEGATIVE mg/dL
Urobilinogen, UA: 0.2 mg/dL (ref 0.0–1.0)

## 2013-09-06 LAB — COMPREHENSIVE METABOLIC PANEL
ALT: 293 U/L — ABNORMAL HIGH (ref 0–35)
AST: 79 U/L — ABNORMAL HIGH (ref 0–37)
Albumin: 2.5 g/dL — ABNORMAL LOW (ref 3.5–5.2)
Alkaline Phosphatase: 55 U/L (ref 39–117)
BUN: 43 mg/dL — ABNORMAL HIGH (ref 6–23)
Chloride: 101 mEq/L (ref 96–112)
GFR calc non Af Amer: 22 mL/min — ABNORMAL LOW (ref >90)
Potassium: 4.7 mEq/L (ref 3.5–5.1)
Sodium: 137 mEq/L (ref 135–145)
Total Bilirubin: 0.4 mg/dL (ref 0.3–1.2)
Total Protein: 4.7 g/dL — ABNORMAL LOW (ref 6.0–8.3)

## 2013-09-06 LAB — BLOOD GAS, ARTERIAL
Acid-base deficit: 15.4 mmol/L — ABNORMAL HIGH (ref 0.0–2.0)
Bicarbonate: 10.2 mEq/L — ABNORMAL LOW (ref 20.0–24.0)
Drawn by: 317771
O2 Content: 3 L/min
O2 Saturation: 97.8 %
Patient temperature: 37
pH, Arterial: 7.274 — ABNORMAL LOW (ref 7.350–7.450)

## 2013-09-06 LAB — CREATININE, URINE, RANDOM: Creatinine, Urine: 138.27 mg/dL

## 2013-09-06 LAB — GLUCOSE, CAPILLARY
Glucose-Capillary: 240 mg/dL — ABNORMAL HIGH (ref 70–99)
Glucose-Capillary: 202 mg/dL — ABNORMAL HIGH (ref 70–99)
Glucose-Capillary: 224 mg/dL — ABNORMAL HIGH (ref 70–99)
Glucose-Capillary: 159 mg/dL — ABNORMAL HIGH (ref 70–99)

## 2013-09-06 LAB — CBC
HCT: 22.2 % — ABNORMAL LOW (ref 36.0–46.0)
MCH: 26.4 pg (ref 26.0–34.0)
MCV: 81.3 fL (ref 78.0–100.0)
Platelets: 208 10*3/uL (ref 150–400)
RDW: 21.4 % — ABNORMAL HIGH (ref 11.5–15.5)
WBC: 31.6 10*3/uL — ABNORMAL HIGH (ref 4.0–10.5)

## 2013-09-06 MED ORDER — PIPERACILLIN-TAZOBACTAM 3.375 G IVPB
INTRAVENOUS | Status: AC
  Filled 2013-09-06: qty 100

## 2013-09-06 MED ORDER — VANCOMYCIN HCL IN DEXTROSE 1-5 GM/200ML-% IV SOLN
INTRAVENOUS | Status: AC
  Filled 2013-09-06: qty 200

## 2013-09-06 MED ORDER — INSULIN ASPART 100 UNIT/ML ~~LOC~~ SOLN: 0.0000 [IU] | Freq: Every day | SUBCUTANEOUS | Status: DC

## 2013-09-06 MED ORDER — VANCOMYCIN HCL IN DEXTROSE 1-5 GM/200ML-% IV SOLN
1000.0000 mg | INTRAVENOUS | Status: DC
  Administered 2013-09-06: 1000 mg via INTRAVENOUS
  Filled 2013-09-06: qty 200

## 2013-09-06 MED ORDER — SODIUM CHLORIDE 0.9 % IV BOLUS (SEPSIS)
250.0000 mL | Freq: Once | INTRAVENOUS | Status: AC
  Administered 2013-09-06: 250 mL via INTRAVENOUS

## 2013-09-06 MED ORDER — PIPERACILLIN-TAZOBACTAM 3.375 G IVPB
3.3750 g | Freq: Three times a day (TID) | INTRAVENOUS | Status: DC
  Administered 2013-09-06: 3.375 g via INTRAVENOUS
  Filled 2013-09-06 (×3): qty 50

## 2013-09-06 MED ORDER — LOPERAMIDE HCL 2 MG PO CAPS
2.0000 mg | ORAL_CAPSULE | Freq: Four times a day (QID) | ORAL | Status: DC | PRN
  Filled 2013-09-06: qty 1

## 2013-09-06 MED ORDER — INSULIN ASPART 100 UNIT/ML ~~LOC~~ SOLN
0.0000 [IU] | Freq: Three times a day (TID) | SUBCUTANEOUS | Status: DC
  Administered 2013-09-06 (×2): 3 [IU] via SUBCUTANEOUS

## 2013-09-06 MED ORDER — SODIUM CHLORIDE 0.9 % IV BOLUS (SEPSIS)
1000.0000 mL | Freq: Once | INTRAVENOUS | Status: AC
  Administered 2013-09-06: 1000 mL via INTRAVENOUS

## 2013-09-06 MED ORDER — FUROSEMIDE 10 MG/ML IJ SOLN
40.0000 mg | Freq: Two times a day (BID) | INTRAMUSCULAR | Status: DC
  Administered 2013-09-06 (×2): 40 mg via INTRAVENOUS
  Filled 2013-09-06 (×2): qty 4

## 2013-09-06 MED ORDER — MAGIC MOUTHWASH
5.0000 mL | Freq: Three times a day (TID) | ORAL | Status: DC
  Administered 2013-09-06 (×2): 5 mL via ORAL
  Filled 2013-09-06 (×3): qty 5

## 2013-09-06 MED ORDER — ENOXAPARIN SODIUM 80 MG/0.8ML ~~LOC~~ SOLN
80.0000 mg | SUBCUTANEOUS | Status: DC
  Administered 2013-09-06: 80 mg via SUBCUTANEOUS
  Filled 2013-09-06: qty 0.8

## 2013-09-06 MED ORDER — DILTIAZEM HCL ER COATED BEADS 180 MG PO CP24: 360.0000 mg | ORAL_CAPSULE | ORAL | Status: DC

## 2013-09-06 MED ORDER — SODIUM CHLORIDE 0.9 % IV BOLUS (SEPSIS)
500.0000 mL | Freq: Once | INTRAVENOUS | Status: AC
  Administered 2013-09-06: 500 mL via INTRAVENOUS

## 2013-09-06 NOTE — Progress Notes (Signed)
[facility] PROGRESS NOTE  [name]:[id number] DOB: 28-Sep-1941 DOA: 08/21/2013 PCP: Evlyn Courier, MD  Assessment/Plan: 1. Acute renal failure  1. GFR from 82 -> 36 (43% drop in GFR) 2. Worsening this am in spite of gentle IV hydration 3. Cont IVF 4. Likely related to poor PO intake  5. Urine output minimal. Will recheck urinalysis given rise in white count. Will request nephrology consult. Consider increase in IV fluids 2. Dysphagia  1. Per family pt has been experiencing difficulty swallowing food and pills 2. Await SLP for eval 3. HTN  1. BP stable 2. Cont meds for now 4. HLD  1. Cont statin 5. Hx adenocarcinoma  1. Stable 2. Somewhat lethargic this am. Will evaluate sedating medications and modify as indicated 6. Recent DVT with PE  1. Will continue on therapeutic Lovenox per home regimen  Leukocytosis: trending up today. Afebrile.  Hemodynamically stable. Somewhat lethargic.  Will recheck urine and consider repeat chest xray now that pt has had IV fluids.   Hyperglycemia: no hx of same. A1c 6.3 non 11/24. Will provide SSI and CBG monitoring.     Code Status: DNR - Confirmed with patient  Family Communication: none available  Disposition: requesting placement. Will request SW consult    Consultants:  nephrology  Procedures:  none  Antibiotics: none HPI/Subjective: Lethargic. Will follow commands slowly. Denies pain  Objective: Filed Vitals:   09/06/13 0437  BP: 114/72  Pulse: 90  Temp: 97.3 F (36.3 C)  Resp: 20    Intake/Output Summary (Last 24 hours) at 09/06/13 0852 Last data filed at 08/30/2013 2000  Gross per 24 hour  Intake     50 ml  Output    225 ml  Net   -175 ml   Filed Weights   09/02/2013 1621  Weight: 85.3 kg (188 lb 0.8 oz)    Exam:   General:  Obese lethargic NAD  Cardiovascular: RRR No MGR No LE edema  Respiratory: normal effort but shallow BS distant but clear no wheeze  Abdomen: obese soft +BS  non-tender to palpation  Musculoskeletal: no clubbing no cyanosis   Data Reviewed: Basic Metabolic Panel:  Recent Labs Lab 09/03/13 0325 08/13/2013 1404 09/06/13 0516  NA 136 135 137  K 4.6 5.2* 4.7  CL 96 98 101  CO2 27 22 25   GLUCOSE 154* 279* 223*  BUN 19 33* 43*  CREATININE 0.81 1.60* 2.09*  CALCIUM 10.8* 10.2 9.0   Liver Function Tests:  Recent Labs Lab 09/03/13 0325 09/06/13 0516  AST 80* 79*  ALT 380* 293*  ALKPHOS 81 55  BILITOT 0.5 0.4  PROT 7.4 4.7*  ALBUMIN 3.8 2.5*   No results found for this basename: LIPASE, AMYLASE,  in the last 168 hours No results found for this basename: AMMONIA,  in the last 168 hours CBC:  Recent Labs Lab 09/03/13 0325 08/25/2013 1404 09/06/13 0516  WBC 10.2 27.3* 31.6*  NEUTROABS  --  23.8*  --   HGB 12.0 9.7* 7.2*  HCT 37.5 29.9* 22.2*  MCV 80.1 81.3 81.3  PLT 343 241 208   Cardiac Enzymes:  Recent Labs Lab 09/03/13 0325 08/24/2013 1404  TROPONINI <0.30 <0.30   BNP (last 3 results)  Recent Labs  08/18/13 2129  PROBNP 164.2*   CBG: No results found for this basename: GLUCAP,  in the last 168 hours  No results found for this or any previous visit (from the past 240 hour(s)).   Studies: Dg Chest 2  View  09/19/13   CLINICAL DATA:  Loss of consciousness.  Shortness of breath.  EXAM: CHEST  2 VIEW  COMPARISON:  08/30/2013  FINDINGS: The heart size and mediastinal contours are within normal limits. Both lungs are clear. The visualized skeletal structures are unremarkable. Left subclavian Port-A-Cath terminates in the mid superior vena cava.  IMPRESSION: No active cardiopulmonary disease.   Electronically Signed   By: Britta Mccreedy M.D.   On: 2013/09/19 14:56    Scheduled Meds: . sodium chloride   Intravenous STAT  . atorvastatin  20 mg Oral q1800  . dicyclomine  20 mg Oral BID AC & HS  . diltiazem  360 mg Oral BH-q7a  . docusate sodium  100 mg Oral Daily  . enoxaparin  120 mg Subcutaneous Q24H  .  escitalopram  10 mg Oral Daily  . fluconazole  100 mg Oral Daily  . Linaclotide  145 mcg Oral Daily  . metoCLOPramide  10 mg Oral QID  . metoprolol succinate  75 mg Oral Daily  . omega-3 acid ethyl esters  1 g Oral BID  . pantoprazole  40 mg Oral BID  . sodium chloride  250 mL Intravenous Once   Continuous Infusions: . sodium chloride 100 mL/hr at 09/06/13 4098    Principal Problem:   ARF (acute renal failure) Active Problems:   DYSLIPIDEMIA   HYPERTENSION   Adenocarcinoma of stomach   Acute renal failure   Anemia   Leukocytosis, unspecified    Time spent: 35 minutes    [facility] M  [facility] Pager [phone]. If 7PM-7AM, please contact night-coverage at [email or web address], password [facility] 09/06/2013, 8:52 AM  LOS: 1 day

## 2013-09-06 NOTE — Evaluation (Signed)
Clinical/Bedside Swallow Evaluation  Patient Details  Name: [name] MRN: [id number] Date of Birth: 11-29-1940  Today's Date: 09/06/2013 Time: 1000-1031 SLP Time Calculation (min): 31 min  Past Medical History:  Past Medical History  Diagnosis Date  . Hypertension   . Paroxysmal SVT (supraventricular tachycardia)   . Hypercholesteremia   . GERD (gastroesophageal reflux disease)   . PUD (peptic ulcer disease)   . DVT (deep venous thrombosis) 2007, 08/2013    LLE  . Adrenal adenoma     left  . Thyroid nodule     left  . H. pylori infection   . Diverticulosis   . 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 Diarrhea     recurrent  . Arthritis   . Hiatal hernia   . Adenocarcinoma of stomach     stage IV, metastatic  . Metastatic cancer   . Pulmonary embolism, bilateral 08/2013  . Chronic leg pain    Past Surgical History:  Past Surgical History  Procedure Laterality Date  . Total knee arthroplasty  bilateral knee  . Hip  replacement  left  . Appendectomy    . Cholecystectomy    . Abdominal hysterectomy    . Ablation      of SVT  . Colonoscopy  09/13/2009    HYQ:[id number] internal hemorrhoids  . Esophagogastroduodenoscopy  09/13/2009    WNU:[id number]/  . Esophagogastroduodenoscopy N/A 06/11/2013    Procedure: ESOPHAGOGASTRODUODENOSCOPY (EGD);  Surgeon: West Bali, MD;  Location: [facility];  Service: Endoscopy;  Laterality: N/A;  2:15-moved to 1045 [name] notified pt  . Portacath placement Left 07/06/2013    Procedure: INSERTION PORT-A-CATH;  Surgeon: Almond Lint, MD;  Location: [facility];  Service: General;  Laterality: Left;  . Laparotomy N/A 07/07/2013    Procedure: EXPLORATORY LAPAROTOMY;  Surgeon: Dalia Heading, MD;  Location: [facility];  Service: General;  Laterality: N/A;  . Gastrorrhaphy N/A 07/07/2013    Procedure: Ferrel Logan;  Surgeon: Dalia Heading, MD;  Location: [facility];  Service: General;  Laterality: N/A;   . Breast lumpectomy      left  . Cardiac catheterization  2009    normal coronary arteries  . Joint replacement    . Vena cava filter placement  08/21/2013   HPI:  [name] is a 72 y.o. female  With a hx of DVT, htn, and metastatic stomach cancer who presents to the ED with worsening weakness. Family reports decreased PO intake. Within the past month, the pt was recently admitted for GI bleed as well as new PE and DVT. In the ED, the patient was noted to be clinically dehydrated. She was started on IVF and the hospitalst consulted for admission. On further questioning, pt admits to not eating well and feeling markedly weak. Also reports feeling mildly SOB. Family reports pt with difficulty swallowing pills and food.   Assessment / Plan / Recommendation Clinical Impression  [name] was somewhat lethargic due to pain medication, however she verbalized willingness to complete evaluation. Pt denies dysphagia (family reports difficulty swallowing pills and solids but no family present for evaluation), but reports lack of appetite. Tongue is coated and dry- may benefit from magic mouthwash while inbetween chemo. Pt presents with no overt signs or symptoms of aspiration with consistencies presented, but does have a coated tongue and lingual residuals of solids after trials. Pt required mod/max cues to clear oral cavity (via liquid wash and swish and spit).  Suspect this is due to lethargy. Recommend D3/mech soft and thin liquids when pt is alert and upright. SLP will attempt to speak with family to inquire about their reports of dysphagia with solids and pills. Given stomach cancer and chemo, it is understandable that pt may have lack of appetite.     Aspiration Risk  Mild    Diet Recommendation Dysphagia 3 (Mechanical Soft);Thin liquid   Liquid Administration via: Cup;Straw Medication Administration: Crushed with puree Supervision: Staff to assist with self feeding;Trained caregiver to feed  patient Compensations: Slow rate;Follow solids with liquid Postural Changes and/or Swallow Maneuvers: Seated upright 90 degrees;Upright 30-60 min after meal    Other  Recommendations Oral Care Recommendations: Oral care before and after PO;Staff/trained caregiver to provide oral care Other Recommendations: Clarify dietary restrictions   Follow Up Recommendations  24 hour supervision/assistance    Frequency and Duration min 2x/week  1 week       SLP Swallow Goals  See care plan section   Swallow Study Prior Functional Status       General Date of Onset: 08/31/2013 HPI: [name] is a 72 y.o. female  With a hx of DVT, htn, and metastatic stomach cancer who presents to the ED with worsening weakness. Family reports decreased PO intake. Within the past month, the pt was recently admitted for GI bleed as well as new PE and DVT. In the ED, the patient was noted to be clinically dehydrated. She was started on IVF and the hospitalst consulted for admission. On further questioning, pt admits to not eating well and feeling markedly weak. Also reports feeling mildly SOB. Family reports pt with difficulty swallowing pills and food. Type of Study: Bedside swallow evaluation Diet Prior to this Study: Regular;Thin liquids Temperature Spikes Noted: No Respiratory Status: Room air History of Recent Intubation: No Behavior/Cognition: Lethargic Oral Cavity - Dentition: Adequate natural dentition Self-Feeding Abilities: Needs assist Patient Positioning: Upright in bed Baseline Vocal Quality: Hoarse;Clear Volitional Cough: Weak Volitional Swallow: Able to elicit    Oral/Motor/Sensory Function Overall Oral Motor/Sensory Function: Appears within functional limits for tasks assessed   Ice Chips Ice chips: Impaired Presentation: Spoon Pharyngeal Phase Impairments: Other (comments) (multiple swallows)   Thin Liquid Thin Liquid: Within functional limits Presentation: Straw    Nectar Thick Nectar  Thick Liquid: Not tested   Honey Thick Honey Thick Liquid: Not tested   Puree Puree: Within functional limits Presentation: Spoon   Solid   GO    Solid: Impaired Presentation: Spoon Oral Phase Impairments: Reduced lingual movement/coordination Oral Phase Functional Implications: Oral residue (lingual residue)      Thank you,  Havery Moros, CCC-SLP [phone]  PORTER,DABNEY 09/06/2013,11:49 AM

## 2013-09-06 NOTE — Progress Notes (Signed)
ANTIBIOTIC CONSULT NOTE - INITIAL  Pharmacy Consult for Vancomycin and Zosyn  Indication: rule out sepsis  No Known Allergies  Patient Measurements: Height: 5\' 6"  (167.6 cm) Weight: 188 lb 0.8 oz (85.3 kg) IBW/kg (Calculated) : 59.3 Vital Signs: Temp: 97.5 F (36.4 C) (12/01 1335) BP: 94/47 mmHg (12/01 1607) Pulse Rate: 74 (12/01 1607) Intake/Output from previous day: 11/30 0701 - 12/01 0700 In: 50 [P.O.:50] Out: 225 [Urine:225] Intake/Output from this shift: Total I/O In: 3070.7 [I.V.:2820.7; IV Piggyback:250] Out: 200 [Urine:200]  Labs:  Recent Labs  09-27-2013 1404 09/06/13 0516 09/06/13 0944  WBC 27.3* 31.6*  --   HGB 9.7* 7.2*  --   PLT 241 208  --   LABCREA  --   --  138.27  CREATININE 1.60* 2.09*  --    Estimated Creatinine Clearance: 26.4 ml/min (by C-G formula based on Cr of 2.09). No results found for this basename: VANCOTROUGH, Leodis Binet, VANCORANDOM, GENTTROUGH, GENTPEAK, GENTRANDOM, TOBRATROUGH, TOBRAPEAK, TOBRARND, AMIKACINPEAK, AMIKACINTROU, AMIKACIN,  in the last 72 hours   Microbiology: Recent Results (from the past 720 hour(s))  MRSA PCR SCREENING     Status: None   Collection Time    08/19/13  1:45 AM      Result Value Range Status   MRSA by PCR NEGATIVE  NEGATIVE Final   Comment:            The GeneXpert MRSA Assay (FDA     approved for NASAL specimens     only), is one component of a     comprehensive MRSA colonization     surveillance program. It is not     intended to diagnose MRSA     infection nor to guide or     monitor treatment for     MRSA infections.    Medical History: Past Medical History  Diagnosis Date  . Hypertension   . Paroxysmal SVT (supraventricular tachycardia)   . Hypercholesteremia   . GERD (gastroesophageal reflux disease)   . PUD (peptic ulcer disease)   . DVT (deep venous thrombosis) 2007, 08/2013    LLE  . Adrenal adenoma     left  . Thyroid nodule     left  . H. pylori infection   . Diverticulosis    . 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 Diarrhea     recurrent  . Arthritis   . Hiatal hernia   . Adenocarcinoma of stomach     stage IV, metastatic  . Metastatic cancer   . Pulmonary embolism, bilateral 08/2013  . Chronic leg pain    Medications:  Scheduled:  . atorvastatin  20 mg Oral q1800  . dicyclomine  20 mg Oral BID AC & HS  . [START ON 09/26/2013] diltiazem  360 mg Oral BH-q7a  . docusate sodium  100 mg Oral Daily  . enoxaparin  80 mg Subcutaneous Q24H  . escitalopram  10 mg Oral Daily  . fluconazole  100 mg Oral Daily  . furosemide  40 mg Intravenous BID  . insulin aspart  0-5 Units Subcutaneous QHS  . insulin aspart  0-9 Units Subcutaneous TID WC  . Linaclotide  145 mcg Oral Daily  . magic mouthwash  5 mL Oral TID  . metoCLOPramide  10 mg Oral QID  . metoprolol succinate  75 mg Oral Daily  . omega-3 acid ethyl esters  1 g Oral BID  . pantoprazole  40 mg Oral BID  . sodium  chloride  250 mL Intravenous Once  . sodium chloride  500 mL Intravenous Once   Assessment: Okay for Protocol, worsening renal function with an Estimated Creatinine Clearance: 26.4 ml/min (by C-G formula based on Cr of 2.09).  BCx x 2 pending.  Vancomycin 12/1 >> Zosyn 12/1 >> Fluconazole 10/31?? >>  Goal of Therapy:  Vancomycin trough level 15-20 mcg/ml  Plan: Zosyn 3.375gm IV every 8 hours. Follow-up micro data, labs, vitals. Vancomycin 1000mg  IV every 24 hours. Measure antibiotic drug levels at steady state Follow up culture results  Mady Gemma 09/06/2013,6:22 PM

## 2013-09-06 NOTE — Consult Note (Signed)
Reason for Consult: Acute kidney injury Referring Physician: Dr. [name] is an 72 y.o. female.  HPI: She is her a patient who has a history of hypertension and the adeno carcinoma of the stomach with metastasis presently came with complaints of nausea, vomiting and abdominal pain off a couple of days duration. According to patient she has this nausea vomiting for some time however seems to be getting worse. Presently patient is started on the chemotherapy. Patient says that she's not able to keep anything down. She denies any diarrhea. She is also some problem of breathing. Her appetite is very poor. She denies any previous history of renal failure.  Past Medical History  Diagnosis Date  . Hypertension   . Paroxysmal SVT (supraventricular tachycardia)   . Hypercholesteremia   . GERD (gastroesophageal reflux disease)   . PUD (peptic ulcer disease)   . DVT (deep venous thrombosis) 2007, 08/2013    LLE  . Adrenal adenoma     left  . Thyroid nodule     left  . H. pylori infection   . Diverticulosis   . 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 Diarrhea     recurrent  . Arthritis   . Hiatal hernia   . Adenocarcinoma of stomach     stage IV, metastatic  . Metastatic cancer   . Pulmonary embolism, bilateral 08/2013  . Chronic leg pain     Past Surgical History  Procedure Laterality Date  . Total knee arthroplasty  bilateral knee  . Hip  replacement  left  . Appendectomy    . Cholecystectomy    . Abdominal hysterectomy    . Ablation      of SVT  . Colonoscopy  09/13/2009    ZOX:[id number] internal hemorrhoids  . Esophagogastroduodenoscopy  09/13/2009    FAO:ZHYQMV/  . Esophagogastroduodenoscopy N/A 06/11/2013    Procedure: ESOPHAGOGASTRODUODENOSCOPY (EGD);  Surgeon: West Bali, MD;  Location: [facility];  Service: Endoscopy;  Laterality: N/A;  2:15-moved to 1045 [name] notified pt  . Portacath placement Left  07/06/2013    Procedure: INSERTION PORT-A-CATH;  Surgeon: Almond Lint, MD;  Location: [facility];  Service: General;  Laterality: Left;  . Laparotomy N/A 07/07/2013    Procedure: EXPLORATORY LAPAROTOMY;  Surgeon: Dalia Heading, MD;  Location: [facility];  Service: General;  Laterality: N/A;  . Gastrorrhaphy N/A 07/07/2013    Procedure: Ferrel Logan;  Surgeon: Dalia Heading, MD;  Location: [facility];  Service: General;  Laterality: N/A;  . Breast lumpectomy      left  . Cardiac catheterization  2009    normal coronary arteries  . Joint replacement    . Vena cava filter placement  08/21/2013    Family History  Problem Relation Age of Onset  . Colon cancer Neg Hx   . Cancer Mother     leukemia  . Cancer Sister     breast  . Heart disease Brother   . Heart disease Sister     Social History:  reports that she has quit smoking. She has never used smokeless tobacco. She reports that she does not drink alcohol or use illicit drugs.  Allergies: No Known Allergies  Medications: I have reviewed the patient's current medications.  Results for orders placed during the hospital encounter of 10-03-13 (from the past 48 hour(s))  CBC WITH DIFFERENTIAL     Status: Abnormal   Collection Time    10/03/2013  2:04 PM      Result Value Range   WBC 27.3 (*) 4.0 - 10.5 K/uL   RBC 3.68 (*) 3.87 - 5.11 MIL/uL   Hemoglobin 9.7 (*) 12.0 - 15.0 g/dL   HCT 54.0 (*) 98.1 - 19.1 %   MCV 81.3  78.0 - 100.0 fL   MCH 26.4  26.0 - 34.0 pg   MCHC 32.4  30.0 - 36.0 g/dL   RDW 47.8 (*) 29.5 - 62.1 %   Platelets 241  150 - 400 K/uL   Neutrophils Relative % 87 (*) 43 - 77 %   Lymphocytes Relative 6 (*) 12 - 46 %   Monocytes Relative 7  3 - 12 %   Eosinophils Relative 0  0 - 5 %   Basophils Relative 0  0 - 1 %   Neutro Abs 23.8 (*) 1.7 - 7.7 K/uL   Lymphs Abs 1.6  0.7 - 4.0 K/uL   Monocytes Absolute 1.9 (*) 0.1 - 1.0 K/uL   Eosinophils Absolute 0.0  0.0 - 0.7 K/uL   Basophils Absolute 0.0  0.0 - 0.1 K/uL   RBC  Morphology POLYCHROMASIA PRESENT     Comment: MANY NRBCs     SCHISTOCYTES PRESENT (2-5/hpf)   WBC Morphology MILD LEFT SHIFT (1-5% METAS, OCC MYELO, OCC BANDS)    BASIC METABOLIC PANEL     Status: Abnormal   Collection Time    08/08/2013  2:04 PM      Result Value Range   Sodium 135  135 - 145 mEq/L   Potassium 5.2 (*) 3.5 - 5.1 mEq/L   Chloride 98  96 - 112 mEq/L   CO2 22  19 - 32 mEq/L   Glucose, Bld 279 (*) 70 - 99 mg/dL   BUN 33 (*) 6 - 23 mg/dL   Creatinine, Ser 3.08 (*) 0.50 - 1.10 mg/dL   Calcium 65.7  8.4 - 84.6 mg/dL   GFR calc non Af Amer 31 (*) >90 mL/min   GFR calc Af Amer 36 (*) >90 mL/min   Comment: (NOTE)     The eGFR has been calculated using the CKD EPI equation.     This calculation has not been validated in all clinical situations.     eGFR's persistently <90 mL/min signify possible Chronic Kidney     Disease.  LACTIC ACID, PLASMA     Status: Abnormal   Collection Time    08/22/2013  2:04 PM      Result Value Range   Lactic Acid, Venous 5.2 (*) 0.5 - 2.2 mmol/L  TROPONIN I     Status: None   Collection Time    08/25/2013  2:04 PM      Result Value Range   Troponin I <0.30  <0.30 ng/mL   Comment:            Due to the release kinetics of cTnI,     a negative result within the first hours     of the onset of symptoms does not rule out     myocardial infarction with certainty.     If myocardial infarction is still suspected,     repeat the test at appropriate intervals.  URINALYSIS W MICROSCOPIC + REFLEX CULTURE     Status: Abnormal   Collection Time    08/23/2013  2:13 PM      Result Value Range   Color, Urine YELLOW  YELLOW   APPearance HAZY (*) CLEAR   Specific Gravity, Urine  1.025  1.005 - 1.030   pH 5.5  5.0 - 8.0   Glucose, UA NEGATIVE  NEGATIVE mg/dL   Hgb urine dipstick NEGATIVE  NEGATIVE   Bilirubin Urine NEGATIVE  NEGATIVE   Ketones, ur NEGATIVE  NEGATIVE mg/dL   Protein, ur NEGATIVE  NEGATIVE mg/dL   Urobilinogen, UA 0.2  0.0 - 1.0 mg/dL    Nitrite NEGATIVE  NEGATIVE   Leukocytes, UA NEGATIVE  NEGATIVE   WBC, UA 0-2  <3 WBC/hpf   Bacteria, UA MANY (*) RARE   Squamous Epithelial / LPF FEW (*) RARE   Casts GRANULAR CAST (*) NEGATIVE  COMPREHENSIVE METABOLIC PANEL     Status: Abnormal   Collection Time    09/06/13  5:16 AM      Result Value Range   Sodium 137  135 - 145 mEq/L   Potassium 4.7  3.5 - 5.1 mEq/L   Chloride 101  96 - 112 mEq/L   CO2 25  19 - 32 mEq/L   Glucose, Bld 223 (*) 70 - 99 mg/dL   BUN 43 (*) 6 - 23 mg/dL   Creatinine, Ser 1.61 (*) 0.50 - 1.10 mg/dL   Calcium 9.0  8.4 - 09.6 mg/dL   Total Protein 4.7 (*) 6.0 - 8.3 g/dL   Albumin 2.5 (*) 3.5 - 5.2 g/dL   AST 79 (*) 0 - 37 U/L   ALT 293 (*) 0 - 35 U/L   Alkaline Phosphatase 55  39 - 117 U/L   Total Bilirubin 0.4  0.3 - 1.2 mg/dL   GFR calc non Af Amer 22 (*) >90 mL/min   GFR calc Af Amer 26 (*) >90 mL/min   Comment: (NOTE)     The eGFR has been calculated using the CKD EPI equation.     This calculation has not been validated in all clinical situations.     eGFR's persistently <90 mL/min signify possible Chronic Kidney     Disease.  CBC     Status: Abnormal   Collection Time    09/06/13  5:16 AM      Result Value Range   WBC 31.6 (*) 4.0 - 10.5 K/uL   RBC 2.73 (*) 3.87 - 5.11 MIL/uL   Hemoglobin 7.2 (*) 12.0 - 15.0 g/dL   Comment: DELTA CHECK NOTED     RESULT REPEATED AND VERIFIED   HCT 22.2 (*) 36.0 - 46.0 %   MCV 81.3  78.0 - 100.0 fL   MCH 26.4  26.0 - 34.0 pg   MCHC 32.4  30.0 - 36.0 g/dL   RDW 04.5 (*) 40.9 - 81.1 %   Platelets 208  150 - 400 K/uL    Dg Chest 2 View  07-Sep-2013   CLINICAL DATA:  Loss of consciousness.  Shortness of breath.  EXAM: CHEST  2 VIEW  COMPARISON:  08/30/2013  FINDINGS: The heart size and mediastinal contours are within normal limits. Both lungs are clear. The visualized skeletal structures are unremarkable. Left subclavian Port-A-Cath terminates in the mid superior vena cava.  IMPRESSION: No active  cardiopulmonary disease.   Electronically Signed   By: Britta Mccreedy M.D.   On: 09/06/2013 14:56    Review of Systems  Constitutional: Positive for malaise/fatigue.  HENT: Positive for congestion.   Respiratory: Positive for shortness of breath. Negative for cough.   Cardiovascular: Negative for chest pain, palpitations, orthopnea and PND.  Gastrointestinal: Positive for nausea, vomiting and abdominal pain. Negative for constipation.  Neurological: Positive for weakness.  Blood pressure 102/70, pulse 79, temperature 97.3 F (36.3 C), temperature source Oral, resp. rate 16, height 5\' 6"  (1.676 m), weight 85.3 kg (188 lb 0.8 oz), SpO2 100.00%. Physical Exam  Constitutional: No distress.  Eyes: No scleral icterus.  Neck: No JVD present.  Cardiovascular: Normal rate.   Respiratory: No respiratory distress. She has no wheezes. She has no rales.  GI: She exhibits no distension. There is tenderness. There is no rebound and no guarding.  Musculoskeletal: She exhibits no edema.  Neurological:   somnolent but arousable. Patient answers questions appropriately.     Assessment/Plan: Problem #1 acute kidney injury possibly prerenal versus ATN. Her BUN and creatinine seems to be progressively worsening. Problem #2 nausea and vomiting with poor by mouth intake. Problem #3 history of abdominal cancer with metastases. Problem #4 hypertension her blood pressure seems to be reasonably controlled Problem #5 history of DVT Problem #6 history of SPVT Problem #7 arthritis Problem #8 history of adrenal adenoma. Plan: Agree with hydration. We'll increase her IV fluid to 135 cc per hour.           We'll check fractional excretion of sodium and also her dialysis.           We'll check her basic metabolic panel, phosphorus and CBC in a.m.            We'll check ultrasound of her kidneys and will follow patient.  [name] S 09/06/2013, 9:32 AM

## 2013-09-06 NOTE — Progress Notes (Signed)
Pt seen and examined. Agree with assessment and plan per Toya Smothers, NP. Pt with worsening renal function despite IVF. Hypotensive this afternoon, given 250cc saline bolus. Appreciate Nephrology input. Will follow up renal US. Follow renal function closely.

## 2013-09-06 NOTE — Progress Notes (Signed)
Patient is having difficulty voiding this morning. Hospitalist on call paged. Awaiting response. Will continue to monitor.

## 2013-09-06 NOTE — Progress Notes (Signed)
Inpatient Diabetes Program Recommendations  AACE/ADA: New Consensus Statement on Inpatient Glycemic Control (2013)  Target Ranges:  Prepandial:   less than 140 mg/dL      Peak postprandial:   less than 180 mg/dL (1-2 hours)      Critically ill patients:  140 - 180 mg/dL  Results for [name], [name] (MRN [id number]) as of 09/06/2013 09:30  Ref. Range 08/30/2013 08:35  Hemoglobin A1C Latest Range: <5.7 % 6.3 (H)   Results for [name], [name] (MRN [id number]) as of 09/06/2013 09:30  Ref. Range 09/06/13 14:04 09/06/2013 05:16  Glucose Latest Range: 70-99 mg/dL 578 (H) 469 (H)    Inpatient Diabetes Program Recommendations Correction (SSI): Please consider ordering CBGs with Novolog sensitive correction ACHS.  Note: Patient does not have a documented history of diabetes.  A1C was 6.3% on 08/30/2013 (inidicative of prediabetes per ADA criteria).  Initial lab glucose was 279 mg/dl and fasting glucose this morning was 223 mg/dl.  Please consider ordering CBGs with Novolog sensitive correction ACHS.  Will continue to follow.  Thanks, Orlando Penner, RN, MSN, CCRN Diabetes Coordinator Inpatient Diabetes Program [phone] (Team Pager) [phone] ([facility] office) [phone] [facility] office)

## 2013-09-06 NOTE — Progress Notes (Signed)
Utilization Review Complete  

## 2013-09-06 NOTE — Progress Notes (Signed)
PT Cancellation Note  Patient Details Name: [name] MRN: [id number] DOB: 09/19/1941   Cancelled Treatment:    Reason Eval/Treat Not Completed: Patient's level of consciousness An attempt was made to see pt for an evaluation this AM.  She was only able to open her eyes momentarily and she stated that she was nauseated.  She was unable to stay awake enough for me to even interview her about her prior functional status.  Will try again tomorrow.  Myrlene Broker L 09/06/2013, 11:51 AM

## 2013-09-06 NOTE — Progress Notes (Signed)
ANTICOAGULATION CONSULT NOTE - Initial Consult  Pharmacy Consult for Lovenox Indication: Recent DVT with PE, was on Lovenox PTA  No Known Allergies  Patient Measurements: Height: 5\' 6"  (167.6 cm) Weight: 188 lb 0.8 oz (85.3 kg) IBW/kg (Calculated) : 59.3  Vital Signs: Temp: 97.3 F (36.3 C) (12/01 0437) Temp src: Oral (12/01 0437) BP: 102/70 mmHg (12/01 0900) Pulse Rate: 79 (12/01 0900)  Labs:  Recent Labs  September 07, 2013 1404 09/06/13 0516  HGB 9.7* 7.2*  HCT 29.9* 22.2*  PLT 241 208  CREATININE 1.60* 2.09*  TROPONINI <0.30  --    Estimated Creatinine Clearance: 26.4 ml/min (by C-G formula based on Cr of 2.09).  Medical History: Past Medical History  Diagnosis Date  . Hypertension   . Paroxysmal SVT (supraventricular tachycardia)   . Hypercholesteremia   . GERD (gastroesophageal reflux disease)   . PUD (peptic ulcer disease)   . DVT (deep venous thrombosis) 2007, 08/2013    LLE  . Adrenal adenoma     left  . Thyroid nodule     left  . H. pylori infection   . Diverticulosis   . 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 Diarrhea     recurrent  . Arthritis   . Hiatal hernia   . Adenocarcinoma of stomach     stage IV, metastatic  . Metastatic cancer   . Pulmonary embolism, bilateral 08/2013  . Chronic leg pain    Medications:  Scheduled:  . sodium chloride   Intravenous STAT  . atorvastatin  20 mg Oral q1800  . dicyclomine  20 mg Oral BID AC & HS  . diltiazem  360 mg Oral BH-q7a  . docusate sodium  100 mg Oral Daily  . enoxaparin  80 mg Subcutaneous Q24H  . escitalopram  10 mg Oral Daily  . fluconazole  100 mg Oral Daily  . furosemide  40 mg Intravenous BID  . insulin aspart  0-5 Units Subcutaneous QHS  . insulin aspart  0-9 Units Subcutaneous TID WC  . Linaclotide  145 mcg Oral Daily  . magic mouthwash  5 mL Oral TID  . metoCLOPramide  10 mg Oral QID  . metoprolol succinate  75 mg Oral Daily  . omega-3 acid ethyl  esters  1 g Oral BID  . pantoprazole  40 mg Oral BID  . sodium chloride  250 mL Intravenous Once    Assessment: 72yo female who was on Lovenox PTA for h/o DVT with PE.  Pt now with ARF.  Estimated Creatinine Clearance: 26.4 ml/min (by C-G formula based on Cr of 2.09).  Goal of Therapy:  Full dose Lovenox for VTE treatment Monitor platelets by anticoagulation protocol: Yes   Plan:  Lovenox 1mg /Kg SQ q24hrs (adjusted for ARF) Monitor CBC  Nikky Duba A 09/06/2013,11:44 AM

## 2013-09-06 NOTE — Progress Notes (Signed)
Pt requesting to sit on side of bed, family member in to help pt sit up, pt became nonresponsive and had a blank stare, would not respond.  After approx one min, pt more responsive VS stable.  MD paged

## 2013-09-06 NOTE — Progress Notes (Signed)
In and Out cath performed using sterile technique.  Pt tolerated well.  Approx of dark yellow, foul smelling urine.  Specimen sent per order.  Pt resting comfortably at this time.

## 2013-09-06 DEATH — deceased

## 2013-09-07 ENCOUNTER — Ambulatory Visit (HOSPITAL_COMMUNITY): Payer: Medicare Other

## 2013-09-07 ENCOUNTER — Inpatient Hospital Stay (HOSPITAL_COMMUNITY): Payer: Medicare Other

## 2013-09-07 MED ORDER — MORPHINE SULFATE 2 MG/ML IJ SOLN
1.0000 mg | INTRAMUSCULAR | Status: DC | PRN
  Administered 2013-09-07: 1 mg via INTRAVENOUS
  Filled 2013-09-07: qty 1

## 2013-09-08 ENCOUNTER — Encounter (HOSPITAL_COMMUNITY): Payer: Medicare Other

## 2013-09-08 NOTE — Progress Notes (Signed)
This encounter was created in error - please disregard.

## 2013-09-09 ENCOUNTER — Encounter (HOSPITAL_COMMUNITY): Payer: Medicare Other

## 2013-09-11 LAB — CULTURE, BLOOD (ROUTINE X 2)
Culture: NO GROWTH
Culture: NO GROWTH

## 2013-09-11 LAB — STOOL CULTURE

## 2013-09-13 ENCOUNTER — Inpatient Hospital Stay (HOSPITAL_COMMUNITY): Payer: Medicare Other

## 2013-09-16 ENCOUNTER — Ambulatory Visit (HOSPITAL_COMMUNITY): Payer: Medicare Other

## 2013-09-20 ENCOUNTER — Inpatient Hospital Stay (HOSPITAL_COMMUNITY): Payer: Medicare Other

## 2013-09-21 ENCOUNTER — Inpatient Hospital Stay (HOSPITAL_COMMUNITY): Payer: Medicare Other

## 2013-09-22 ENCOUNTER — Encounter (HOSPITAL_COMMUNITY): Payer: Medicare Other

## 2013-10-04 ENCOUNTER — Inpatient Hospital Stay (HOSPITAL_COMMUNITY): Payer: Medicare Other

## 2013-10-06 ENCOUNTER — Encounter (HOSPITAL_COMMUNITY): Payer: Medicare Other

## 2013-10-07 NOTE — Progress Notes (Signed)
Patient blood pressure remains low after bolus. Also patient is having many loose stools. Contacted midlevel hospitalist on call. Received order for 1L bolus. Will continue to monitor.

## 2013-10-07 NOTE — Progress Notes (Addendum)
[name] ZOX:[id number] DOB: Jan 02, 1941 DOA: 09/04/2013  The patient was recognized to be actively dying overnight. Discussed with family, son and daughter-in-law, and they came in to see the patient in the early morning. Family recognized the patient is dying and emphasize that she wanted no heroic measures, and specifically wish to die in her sleep. They want her to pass away as comfortably as possible without aggressive intervention. Family was assured the patient would be kept comfortable and every way. They expressed appreciation for the medical staff.  [name] Sep 14, 2013 6:24 AM  addendum  Patient passed away peacefully at 6:11 AM, 09/14/13   [name]

## 2013-10-07 NOTE — Progress Notes (Signed)
Shift event: RN has paged this NP several times re: pt and BP tonight. BP soft and 250cc bolus given. Then, BP soft again and 1000cc bolus being given. During evening, UO has been around 30cc per hr, pt has not had mental status changes, fever, tachypnea or tachycardia. Later-around MN, RN paged stating pt was more difficult to arouse. This NP called Dr. Vedia Coffer who is at the facility. Asked him to see pt and handle further orders, plan and treatment. He stated he will "handle it". This NP called RN again and asked her to communicate further issues to Dr. Orvan Falconer for rest of shift. Jimmye Norman, NP [facility]

## 2013-10-07 NOTE — Progress Notes (Signed)
[facility] contacted. Spoke with Neville Route. Reference # [id number].

## 2013-10-07 NOTE — Progress Notes (Signed)
Hospitalist on the floor to see patient. No new orders received.

## 2013-10-07 NOTE — Progress Notes (Signed)
Patient more responsive than earlier. She is oriented x4. Blood pressure continues to remain low. Will continue to monitor.

## 2013-10-07 NOTE — Progress Notes (Signed)
Patient in respiratory distress. Contacted MD on call to get something to make her comfortable. He says he will call the family to see what their wishes are. Awaiting orders. Will continue to monitor.

## 2013-10-07 NOTE — Progress Notes (Signed)
Contacted patient's son Ruthann Cancer to inform him of the patient's change in status.

## 2013-10-07 NOTE — Progress Notes (Addendum)
Patient expired with family at the bedside. Contacted MD on call to make him aware.

## 2013-10-07 NOTE — Progress Notes (Signed)
Patient status has further declined. She is unresponsive to sternal rubs and painful stimuli. Breathing is labored and o2 sats are as low as 79%. Will contact family to inform them of her decline. Will continue to monitor.

## 2013-10-07 NOTE — Discharge Summary (Signed)
Death Summary  [name] WJX:[id number] DOB: 07-07-1941 DOA: 10-03-13  PCP: Evlyn Courier, MD  Admit date: 10/03/2013 Date of Death: 2013/10/05  Final Diagnoses:  Principal Problem:   ARF (acute renal failure) Active Problems:   DYSLIPIDEMIA   HYPERTENSION   Adenocarcinoma of stomach   Acute renal failure   Anemia   Leukocytosis, unspecified   Hyperglycemia   History of present illness:  [name] is a 73 y.o. female  With a hx of DVT, htn, and metastatic stomach cancer who presents to the ED with worsening weakness. Family reports decreased PO intake. Within the past month, the pt was recently admitted for GI bleed as well as new PE and DVT. In the ED, the patient was noted to be clinically dehydrated. She was started on IVF and the hospitalst consulted for admission. On further questioning, pt admits to not eating well and feeling markedly weak. Also reports feeling mildly SOB. Family reports pt with difficulty swallowing pills and food.  Hospital Course:  The patient was admitted to the hospital and started on aggressive IVF. The patient's renal function worsened and her WBC worsened. The patient's condition worsened overnight with desaturation and episodes of unresponsiveness. Family was made aware of her failing condition and ulitmately was decided to proceed with comfort care only. The patient was later pronounced at 0611 on 10/05/2013.  Signed:  CHIU, Scheryl Marten  [facility] 05-Oct-2013, 7:36 AM

## 2013-10-07 NOTE — Progress Notes (Signed)
Funeral Home of choice was notified [facility]). Post mortem care was done by [facility] and funeral home has picked up pts body for transport to funeral home. Sheryn Bison

## 2013-10-07 NOTE — Progress Notes (Signed)
Patient now unresponsive. Contacted midlevel hospitalist on call. Says that she has contacted and  turned everything over to Dr. Orvan Falconer.

## 2013-10-07 DEATH — deceased

## 2014-06-01 ENCOUNTER — Other Ambulatory Visit: Payer: Self-pay | Admitting: Pharmacist

## 2014-08-30 IMAGING — CR DG CHEST 1V PORT
1 series · 1 of 1 positions shown · non-contrast
Comparison: Portable exam 1212 hr compared to 07/06/2013

CLINICAL DATA: Left side PICC line placement

EXAM:
PORTABLE CHEST - 1 VIEW

[portable]
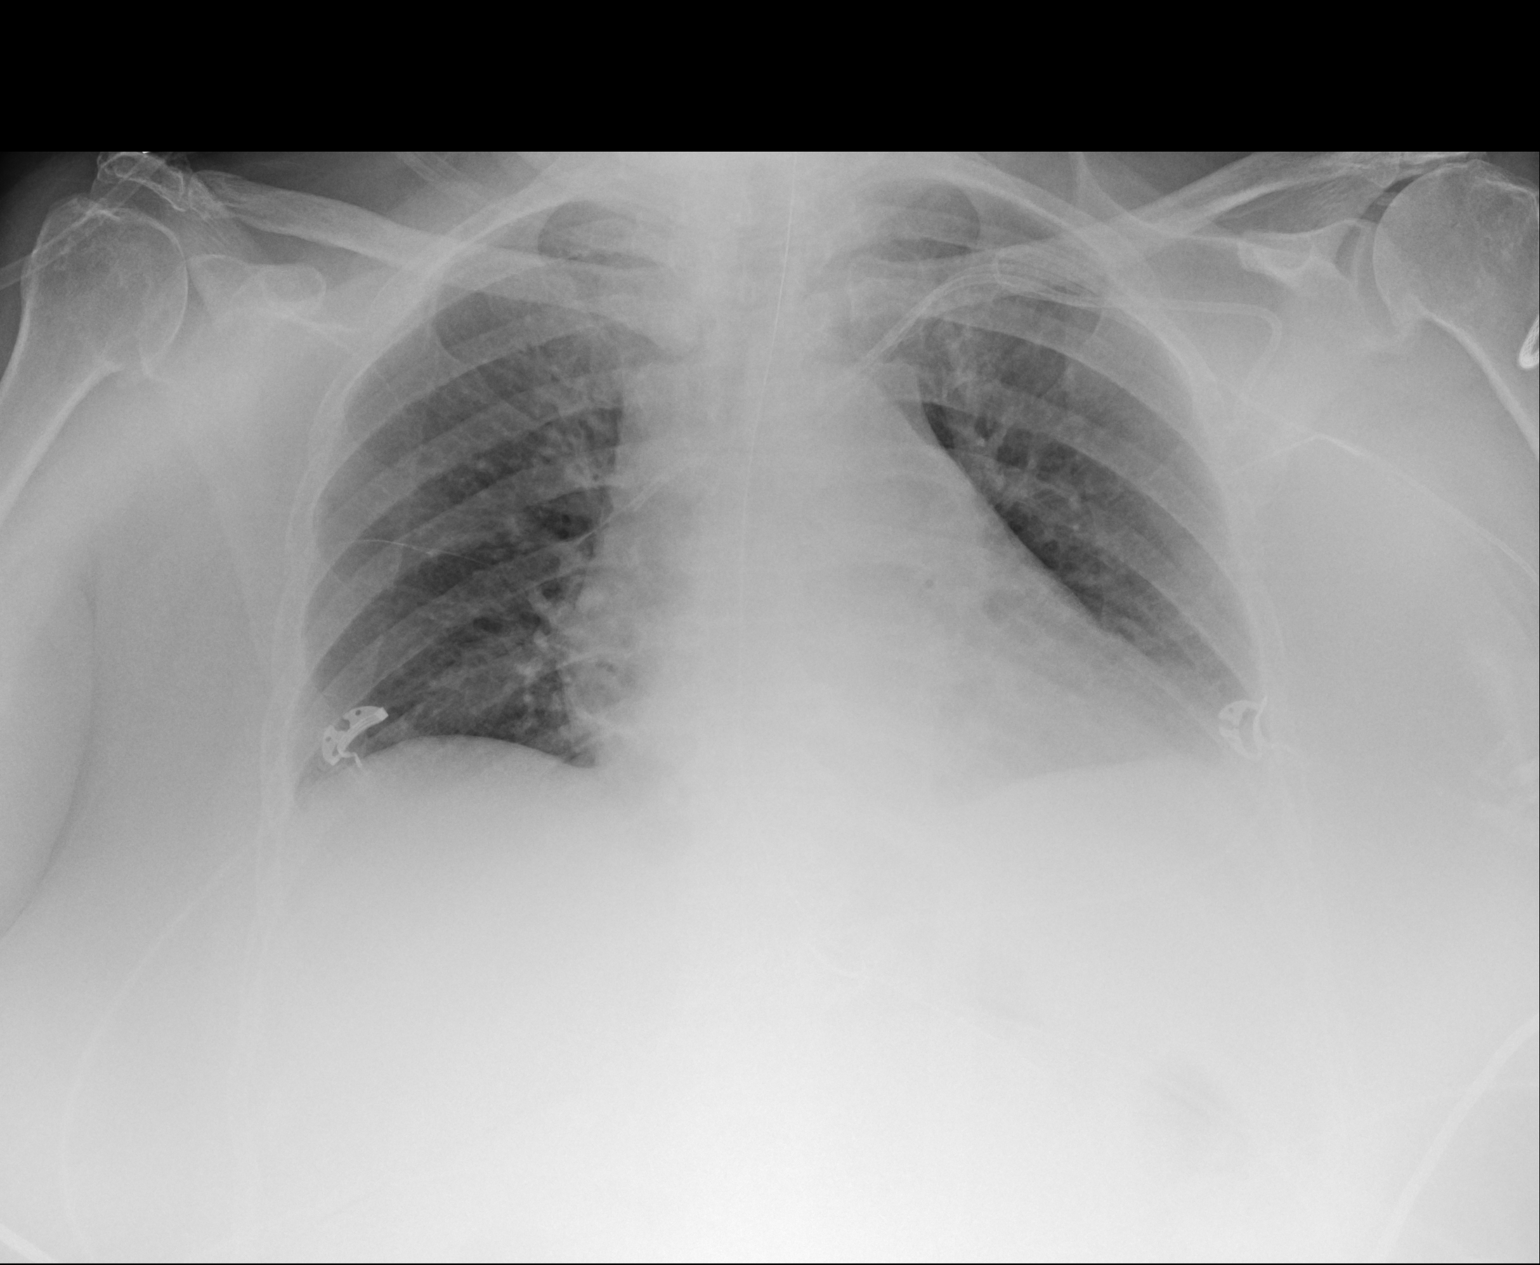

[1 of 1 positions shown; findings below may reference images not displayed]

FINDINGS: Left subclavian Port-A-Cath with tip projecting over SVC.

Left arm PICC line is coiled in the subclavian vein with the tip
directed antegrade at the left brachiocephalic vein left of midline.

Enlargement of cardiac silhouette.

Minimal pulmonary vascular congestion.

Lungs grossly clear.

No pleural effusion or pneumothorax.
IMPRESSION: Left arm PICC line is coiled in the left subclavian vein; recommend
repositioning or replacement.

Findings discussed with Shi RN from the PICC team on 07/08/2013
at 9559 hr.

## 2014-09-28 IMAGING — CR DG ABDOMEN ACUTE W/ 1V CHEST
3 series · 3 of 3 positions shown · non-contrast
Comparison: None.

CLINICAL DATA: History of bleeding ulcer

EXAM:
ACUTE ABDOMEN SERIES (ABDOMEN 2 VIEW & CHEST 1 VIEW)

[view not recorded (1 of 3)]
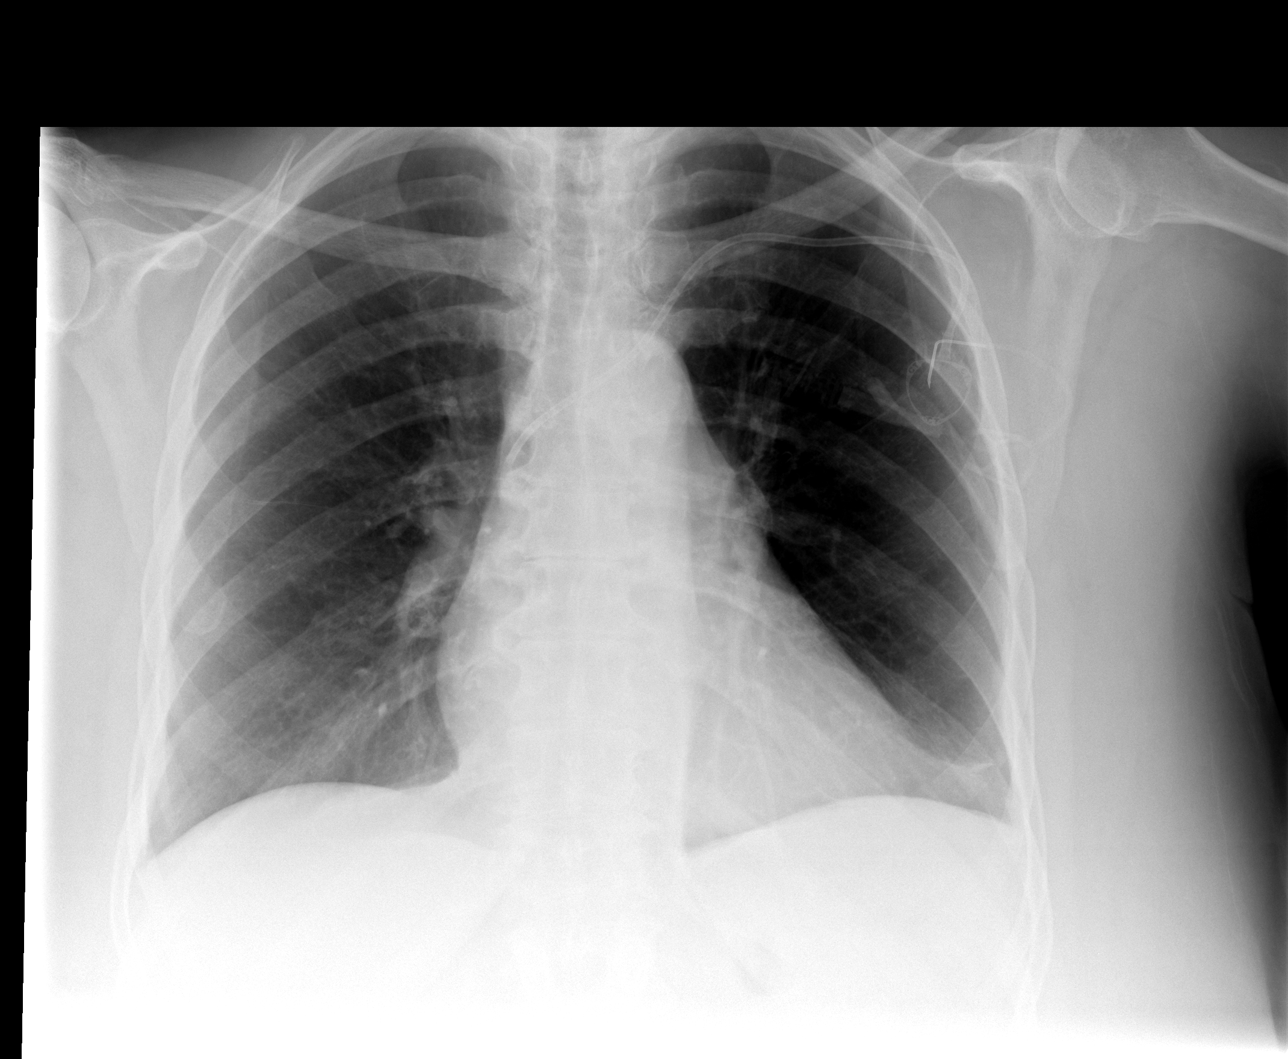

[view not recorded (2 of 3)]
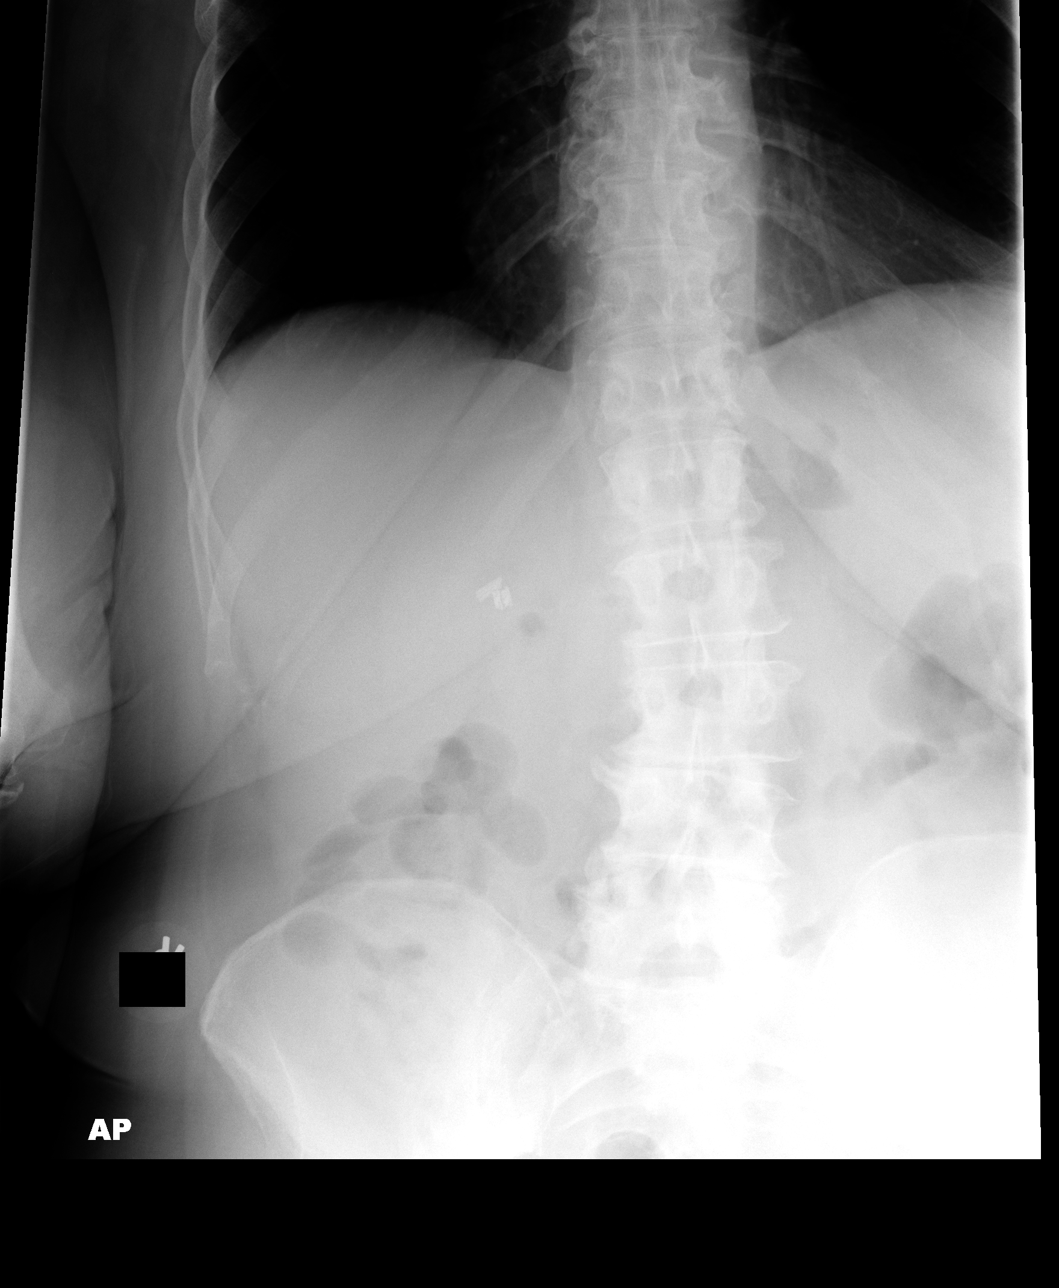

[view not recorded (3 of 3)]
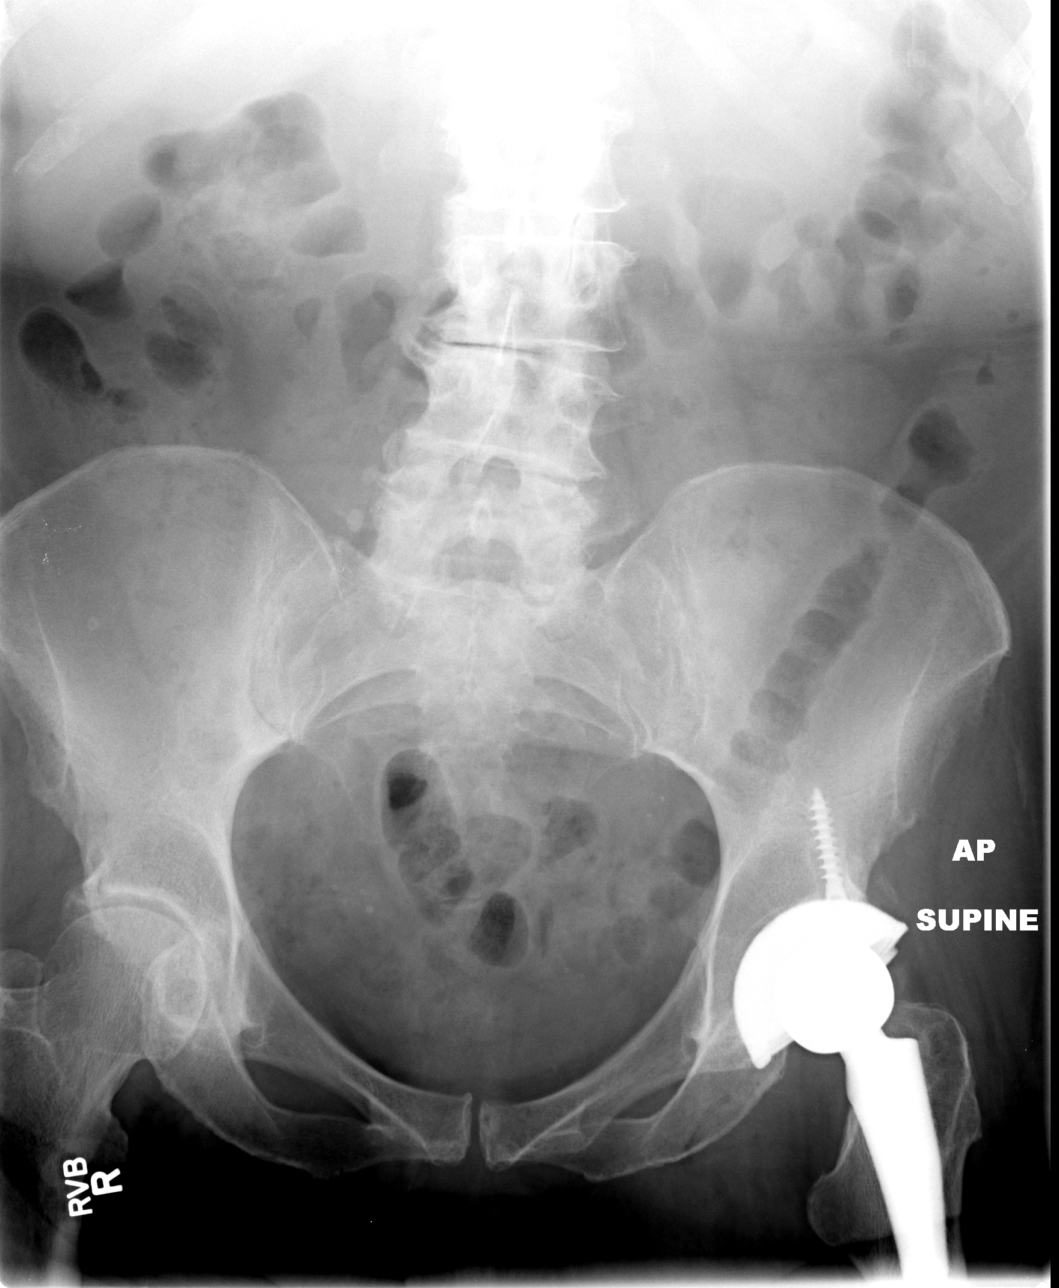

[3 of 3 positions shown; findings below may reference images not displayed]

FINDINGS: Cardiac shadow is within normal limits. A left-sided chest wall port
is seen with the catheter tip in the proximal superior vena cava.
Lungs are clear bilaterally.

The abdomen shows a nonobstructive bowel gas pattern. Degenerative
changes of the lumbar spine are seen. No abnormal mass or abnormal
calcifications are noted. A left hip replacement is seen.
IMPRESSION: No acute abnormality noted.

## 2014-10-29 IMAGING — US US RENAL
1 series · 14 of 25 positions shown · non-contrast
Comparison: CT abdomen and pelvis 07/07/2013

CLINICAL DATA: Worsening renal failure, history hypertension,
hypercholesterolemia, gastric cancer

EXAM:
RENAL/URINARY TRACT ULTRASOUND COMPLETE

[Series 1: us renal · 0.21mm/px · 14 of 34 slices shown]
[im 1/34]
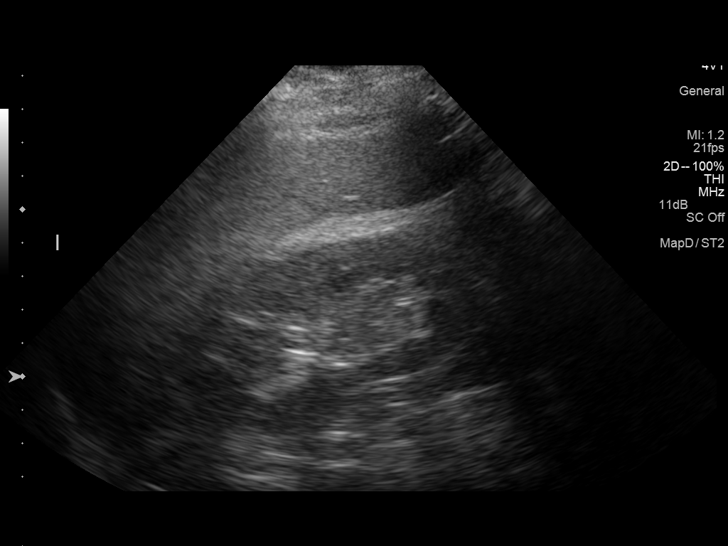
[im 3/34]
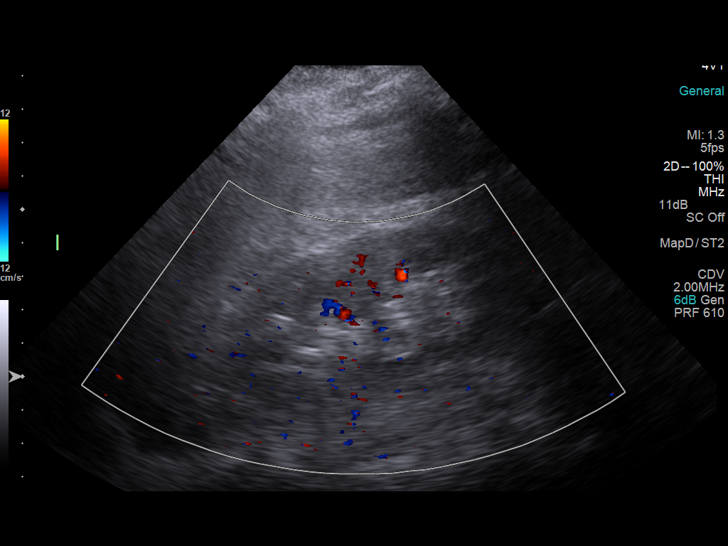
[im 6/34]
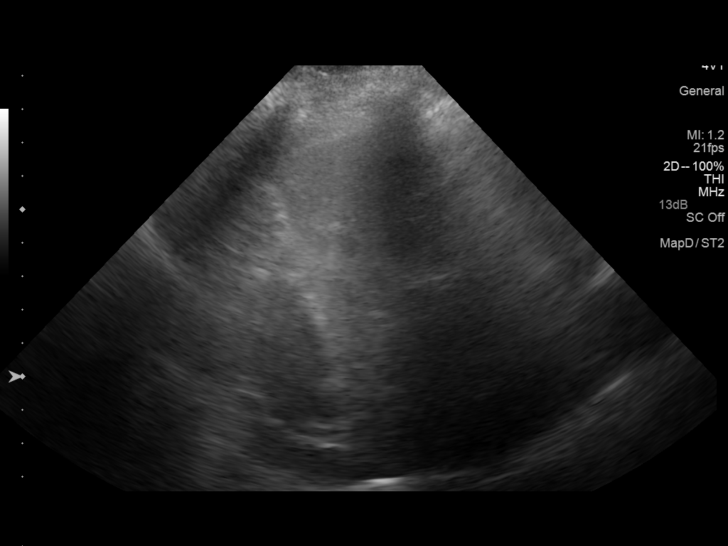
[im 9/34]
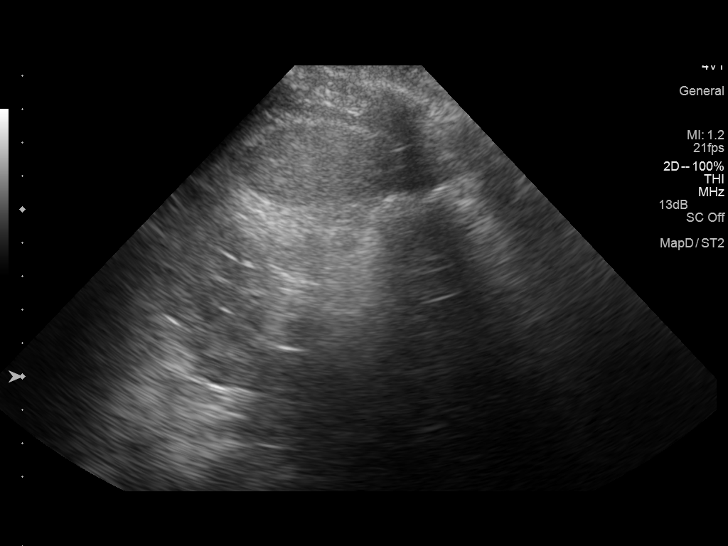
[im 12/34]
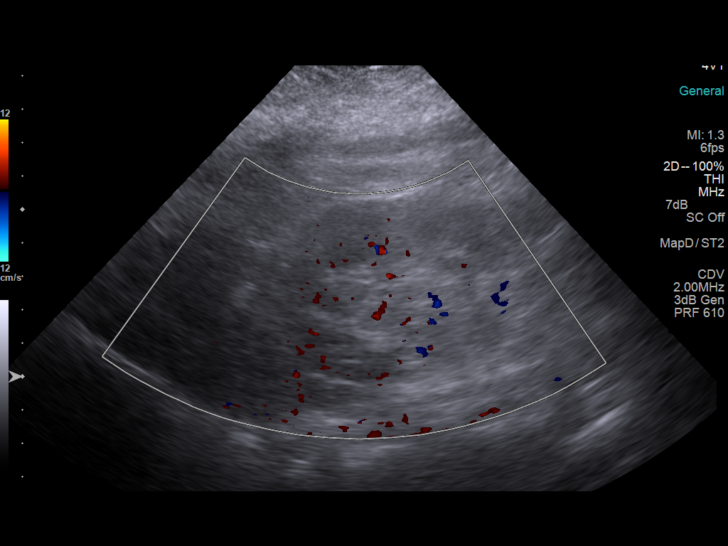
[im 13/34]
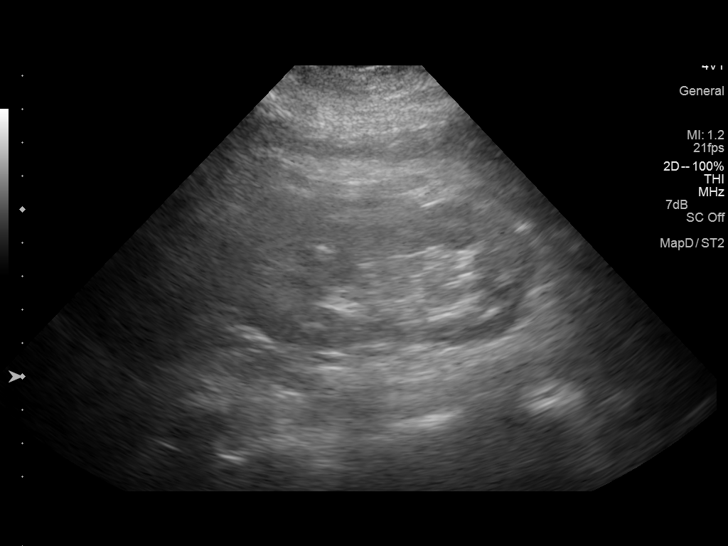
[im 16/34]
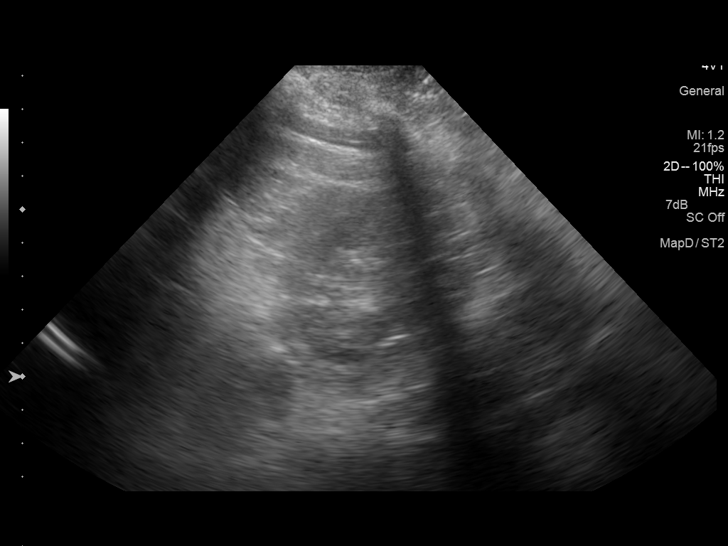
[im 18/34]
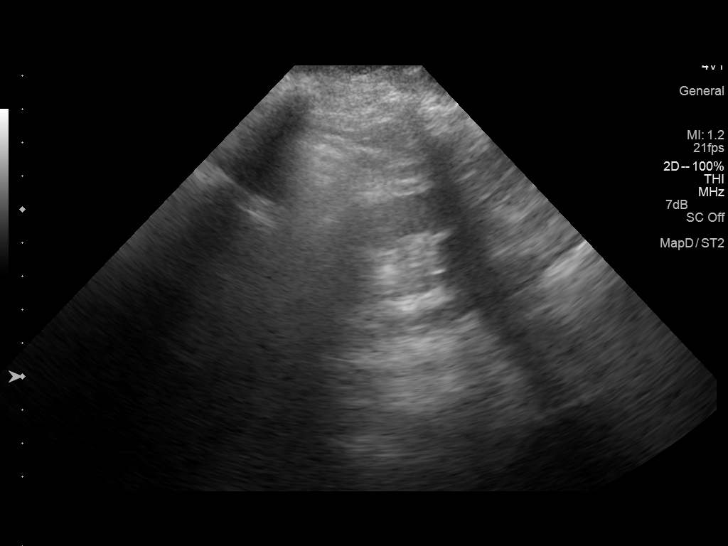
[im 21/34]
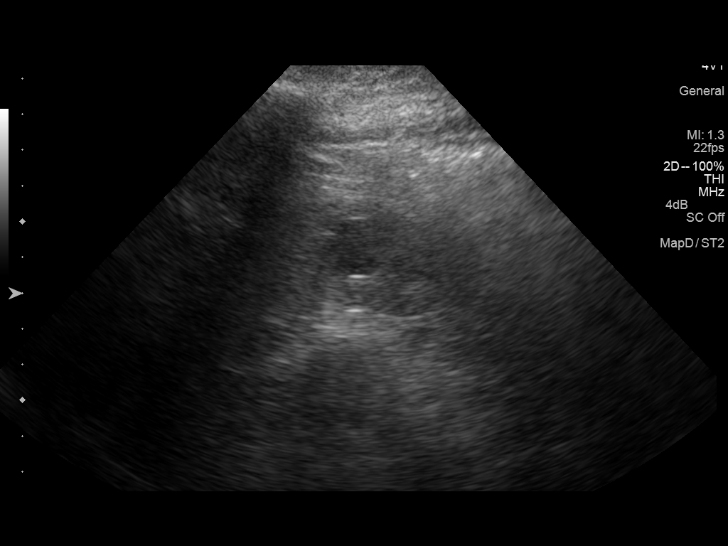
[im 23/34]
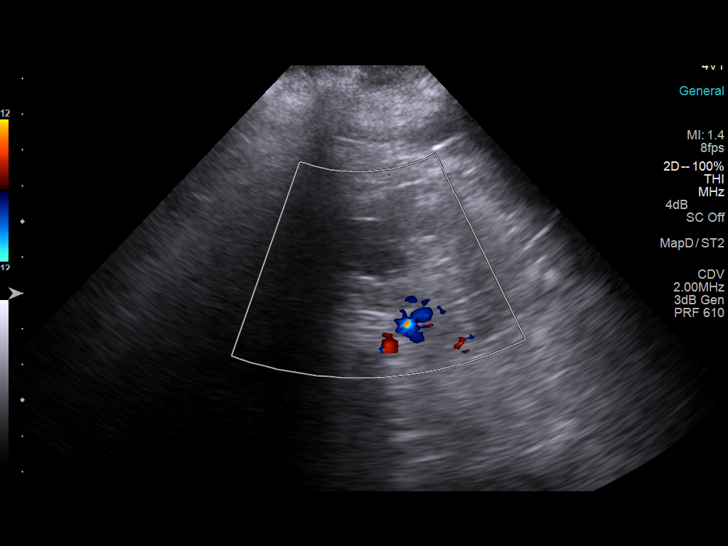
[im 25/34]
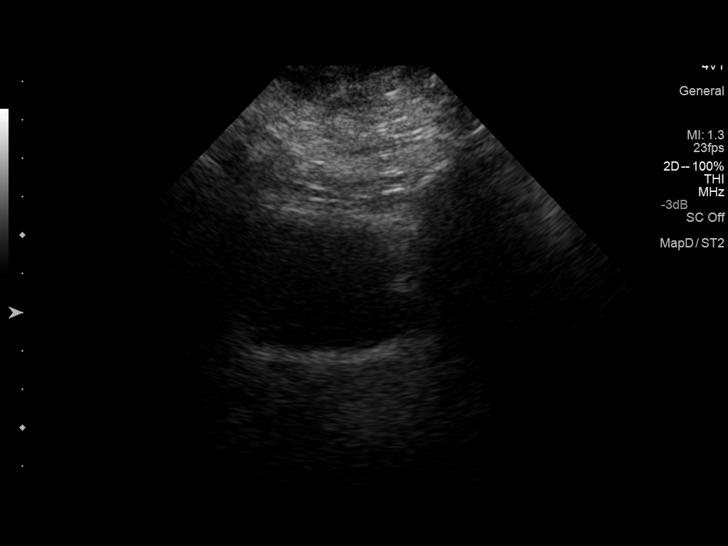
[im 28/34]
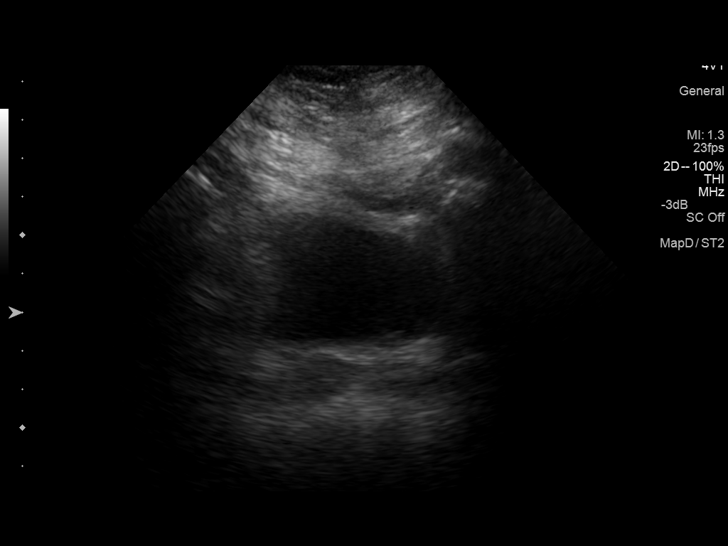
[im 31/34]
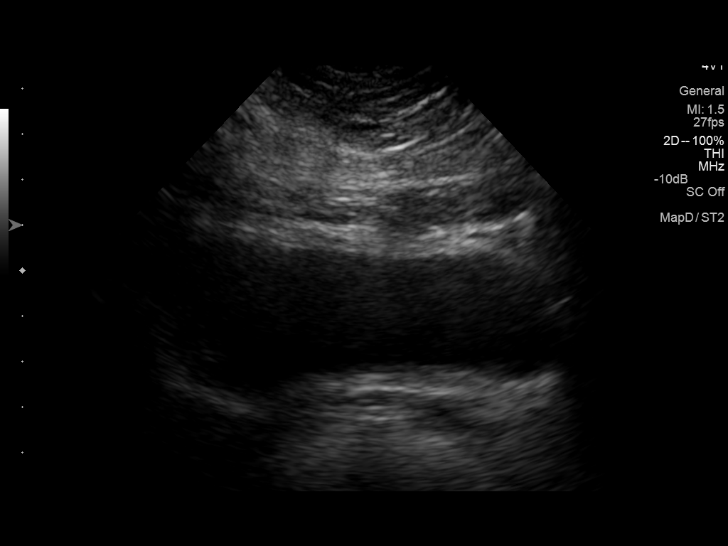
[im 34/34]
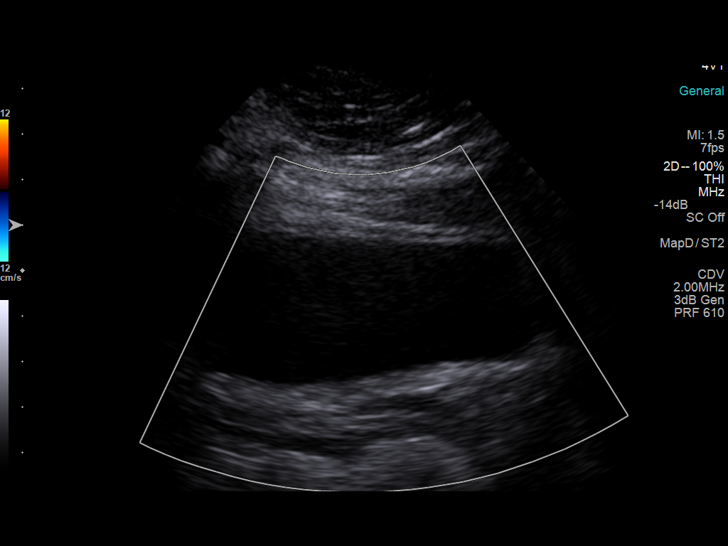

[14 of 25 positions shown; findings below may reference images not displayed]

FINDINGS: Right Kidney:

Length: 9.3 cm. Normal cortical thickness. Slightly increased
cortical echogenicity. No mass, hydronephrosis or shadowing
calcification.

Left Kidney:

Length: 9.8 cm.. Normal cortical thickness. Slightly increased
cortical echogenicity. Hypoechoic nodule at lateral aspect of mid
left kidney question complicated cyst versus artifacts secondary to
body habitus, lesion measuring 16 x 17 x 15 mm. No additional mass,
hydronephrosis or shadowing calcification.

Bladder:

Partially distended, grossly unremarkable. Ureteral jets were not
visualized during imaging.
IMPRESSION: Medical renal disease changes.

Small cyst left kidney unchanged from prior CT.

No evidence of hydronephrosis or additional renal mass.
